# Patient Record
Sex: Female | Born: 1950
Health system: Southern US, Community
[De-identification: ages and names within clinical notes are randomized; demographics above are authoritative.]

## PROBLEM LIST (undated history)

## (undated) DIAGNOSIS — M7072 Other bursitis of hip, left hip: Secondary | ICD-10-CM

## (undated) DIAGNOSIS — R519 Headache, unspecified: Secondary | ICD-10-CM

## (undated) DIAGNOSIS — Z85828 Personal history of other malignant neoplasm of skin: Secondary | ICD-10-CM

## (undated) DIAGNOSIS — H269 Unspecified cataract: Secondary | ICD-10-CM

## (undated) DIAGNOSIS — C801 Malignant (primary) neoplasm, unspecified: Secondary | ICD-10-CM

## (undated) DIAGNOSIS — K219 Gastro-esophageal reflux disease without esophagitis: Secondary | ICD-10-CM

## (undated) DIAGNOSIS — F329 Major depressive disorder, single episode, unspecified: Secondary | ICD-10-CM

## (undated) DIAGNOSIS — K589 Irritable bowel syndrome without diarrhea: Secondary | ICD-10-CM

## (undated) DIAGNOSIS — R51 Headache: Secondary | ICD-10-CM

## (undated) DIAGNOSIS — G4700 Insomnia, unspecified: Secondary | ICD-10-CM

## (undated) DIAGNOSIS — F32A Depression, unspecified: Secondary | ICD-10-CM

## (undated) DIAGNOSIS — M1990 Unspecified osteoarthritis, unspecified site: Secondary | ICD-10-CM

## (undated) DIAGNOSIS — R011 Cardiac murmur, unspecified: Secondary | ICD-10-CM

## (undated) DIAGNOSIS — J302 Other seasonal allergic rhinitis: Secondary | ICD-10-CM

## (undated) DIAGNOSIS — E785 Hyperlipidemia, unspecified: Secondary | ICD-10-CM

## (undated) DIAGNOSIS — I10 Essential (primary) hypertension: Secondary | ICD-10-CM

## (undated) HISTORY — PX: OTHER SURGICAL HISTORY: SHX169

## (undated) HISTORY — PX: CHOLECYSTECTOMY: SHX55

## (undated) HISTORY — PX: KNEE RECONSTRUCTION, MEDIAL PATELLAR FEMORAL LIGAMENT: SHX1898

## (undated) HISTORY — PX: KNEE ARTHROSCOPY: SHX127

## (undated) HISTORY — PX: FRACTURE SURGERY: SHX138

## (undated) HISTORY — PX: COSMETIC SURGERY: SHX468

## (undated) HISTORY — PX: REDUCTION MAMMAPLASTY: SUR839

## (undated) HISTORY — PX: TUBAL LIGATION: SHX77

## (undated) HISTORY — DX: Essential (primary) hypertension: I10

## (undated) HISTORY — PX: COLON SURGERY: SHX602

## (undated) HISTORY — PX: BUNIONECTOMY: SHX129

## (undated) HISTORY — PX: ABDOMINOPLASTY: SUR9

## (undated) HISTORY — PX: EYE SURGERY: SHX253

## (undated) HISTORY — PX: JOINT REPLACEMENT: SHX530

## (undated) HISTORY — PX: BREAST SURGERY: SHX581

## (undated) HISTORY — DX: Unspecified osteoarthritis, unspecified site: M19.90

## (undated) HISTORY — PX: SPINE SURGERY: SHX786

## (undated) HISTORY — DX: Personal history of other malignant neoplasm of skin: Z85.828

## (undated) HISTORY — DX: Unspecified cataract: H26.9

## (undated) HISTORY — DX: Irritable bowel syndrome, unspecified: K58.9

## (undated) HISTORY — DX: Gastro-esophageal reflux disease without esophagitis: K21.9

---

## 1981-09-10 HISTORY — PX: BREAST ENHANCEMENT SURGERY: SHX7

## 1984-09-10 HISTORY — PX: COLECTOMY: SHX59

## 1997-09-10 HISTORY — PX: BACK SURGERY: SHX140

## 1998-08-12 ENCOUNTER — Encounter: Payer: Self-pay | Admitting: Orthopedic Surgery

## 1998-08-12 ENCOUNTER — Observation Stay (HOSPITAL_COMMUNITY): Admission: RE | Admit: 1998-08-12 | Discharge: 1998-08-14 | Payer: Self-pay | Admitting: Orthopedic Surgery

## 1998-08-18 ENCOUNTER — Ambulatory Visit (HOSPITAL_COMMUNITY): Admission: RE | Admit: 1998-08-18 | Discharge: 1998-08-18 | Payer: Self-pay | Admitting: Orthopedic Surgery

## 1998-12-14 ENCOUNTER — Other Ambulatory Visit: Admission: RE | Admit: 1998-12-14 | Discharge: 1998-12-14 | Payer: Self-pay | Admitting: Family Medicine

## 1999-12-18 ENCOUNTER — Ambulatory Visit (HOSPITAL_COMMUNITY): Admission: RE | Admit: 1999-12-18 | Discharge: 1999-12-19 | Payer: Self-pay | Admitting: Neurosurgery

## 1999-12-18 ENCOUNTER — Encounter: Payer: Self-pay | Admitting: Neurosurgery

## 1999-12-18 HISTORY — PX: CERVICAL SPINE SURGERY: SHX589

## 2000-01-17 ENCOUNTER — Other Ambulatory Visit: Admission: RE | Admit: 2000-01-17 | Discharge: 2000-01-17 | Payer: Self-pay | Admitting: Family Medicine

## 2000-02-12 ENCOUNTER — Ambulatory Visit (HOSPITAL_COMMUNITY): Admission: RE | Admit: 2000-02-12 | Discharge: 2000-02-12 | Payer: Self-pay | Admitting: Neurosurgery

## 2000-02-12 ENCOUNTER — Encounter: Payer: Self-pay | Admitting: Neurosurgery

## 2001-02-05 ENCOUNTER — Other Ambulatory Visit: Admission: RE | Admit: 2001-02-05 | Discharge: 2001-02-05 | Payer: Self-pay | Admitting: Family Medicine

## 2001-04-08 ENCOUNTER — Emergency Department (HOSPITAL_COMMUNITY): Admission: EM | Admit: 2001-04-08 | Discharge: 2001-04-08 | Payer: Self-pay | Admitting: Emergency Medicine

## 2001-04-08 ENCOUNTER — Encounter: Payer: Self-pay | Admitting: Emergency Medicine

## 2001-05-29 ENCOUNTER — Ambulatory Visit (HOSPITAL_COMMUNITY): Admission: RE | Admit: 2001-05-29 | Discharge: 2001-05-29 | Payer: Self-pay | Admitting: Neurosurgery

## 2001-05-29 ENCOUNTER — Encounter: Payer: Self-pay | Admitting: Neurosurgery

## 2001-11-20 ENCOUNTER — Ambulatory Visit (HOSPITAL_COMMUNITY): Admission: RE | Admit: 2001-11-20 | Discharge: 2001-11-20 | Payer: Self-pay | Admitting: Plastic Surgery

## 2003-05-20 ENCOUNTER — Emergency Department (HOSPITAL_COMMUNITY): Admission: EM | Admit: 2003-05-20 | Discharge: 2003-05-20 | Payer: Self-pay | Admitting: Emergency Medicine

## 2003-05-21 ENCOUNTER — Other Ambulatory Visit: Admission: RE | Admit: 2003-05-21 | Discharge: 2003-05-21 | Payer: Self-pay | Admitting: Obstetrics and Gynecology

## 2003-09-11 ENCOUNTER — Emergency Department (HOSPITAL_COMMUNITY): Admission: EM | Admit: 2003-09-11 | Discharge: 2003-09-11 | Payer: Self-pay | Admitting: Emergency Medicine

## 2004-03-24 ENCOUNTER — Emergency Department (HOSPITAL_COMMUNITY): Admission: EM | Admit: 2004-03-24 | Discharge: 2004-03-24 | Payer: Self-pay | Admitting: Emergency Medicine

## 2004-06-20 ENCOUNTER — Other Ambulatory Visit: Admission: RE | Admit: 2004-06-20 | Discharge: 2004-06-20 | Payer: Self-pay | Admitting: Obstetrics and Gynecology

## 2004-10-13 ENCOUNTER — Emergency Department (HOSPITAL_COMMUNITY): Admission: EM | Admit: 2004-10-13 | Discharge: 2004-10-13 | Payer: Self-pay | Admitting: Emergency Medicine

## 2005-08-15 ENCOUNTER — Other Ambulatory Visit: Admission: RE | Admit: 2005-08-15 | Discharge: 2005-08-15 | Payer: Self-pay | Admitting: Obstetrics and Gynecology

## 2006-01-18 ENCOUNTER — Ambulatory Visit: Payer: Self-pay | Admitting: Family Medicine

## 2006-01-24 ENCOUNTER — Ambulatory Visit: Payer: Self-pay | Admitting: Family Medicine

## 2006-04-24 ENCOUNTER — Ambulatory Visit: Payer: Self-pay | Admitting: Family Medicine

## 2007-02-04 ENCOUNTER — Ambulatory Visit: Payer: Self-pay | Admitting: Family Medicine

## 2007-02-04 LAB — CONVERTED CEMR LAB
ALT: 23 units/L (ref 0–40)
AST: 30 units/L (ref 0–37)
Albumin: 4.3 g/dL (ref 3.5–5.2)
Alkaline Phosphatase: 65 units/L (ref 39–117)
BUN: 9 mg/dL (ref 6–23)
Basophils Absolute: 0.1 10*3/uL (ref 0.0–0.1)
Basophils Relative: 1 % (ref 0.0–1.0)
Bilirubin, Direct: 0.3 mg/dL (ref 0.0–0.3)
CO2: 31 meq/L (ref 19–32)
Calcium: 9.9 mg/dL (ref 8.4–10.5)
Chloride: 110 meq/L (ref 96–112)
Cholesterol: 158 mg/dL (ref 0–200)
Creatinine, Ser: 1 mg/dL (ref 0.4–1.2)
Eosinophils Absolute: 0.2 10*3/uL (ref 0.0–0.6)
Eosinophils Relative: 2.9 % (ref 0.0–5.0)
GFR calc Af Amer: 74 mL/min
GFR calc non Af Amer: 61 mL/min
Glucose, Bld: 106 mg/dL — ABNORMAL HIGH (ref 70–99)
HCT: 42.4 % (ref 36.0–46.0)
HDL: 43.5 mg/dL (ref >39.0)
Hemoglobin: 14.3 g/dL (ref 12.0–15.0)
LDL Cholesterol: 88 mg/dL (ref 0–99)
Lymphocytes Relative: 25.1 % (ref 12.0–46.0)
MCHC: 33.6 g/dL (ref 30.0–36.0)
MCV: 91 fL (ref 78.0–100.0)
Monocytes Absolute: 0.6 10*3/uL (ref 0.2–0.7)
Monocytes Relative: 8.1 % (ref 3.0–11.0)
Neutro Abs: 5.1 10*3/uL (ref 1.4–7.7)
Neutrophils Relative %: 62.9 % (ref 43.0–77.0)
Platelets: 217 10*3/uL (ref 150–400)
Potassium: 5.1 meq/L (ref 3.5–5.1)
RBC: 4.66 M/uL (ref 3.87–5.11)
RDW: 11.9 % (ref 11.5–14.6)
Sodium: 149 meq/L — ABNORMAL HIGH (ref 135–145)
TSH: 0.95 microintl units/mL (ref 0.35–5.50)
Total Bilirubin: 1.2 mg/dL (ref 0.3–1.2)
Total CHOL/HDL Ratio: 3.6
Total Protein: 6.4 g/dL (ref 6.0–8.3)
Triglycerides: 134 mg/dL (ref 0–149)
VLDL: 27 mg/dL (ref 0–40)
WBC: 8 10*3/uL (ref 4.5–10.5)

## 2007-02-05 ENCOUNTER — Encounter: Payer: Self-pay | Admitting: Family Medicine

## 2007-02-05 DIAGNOSIS — G43909 Migraine, unspecified, not intractable, without status migrainosus: Secondary | ICD-10-CM | POA: Insufficient documentation

## 2007-02-05 DIAGNOSIS — I10 Essential (primary) hypertension: Secondary | ICD-10-CM | POA: Insufficient documentation

## 2007-02-05 DIAGNOSIS — K219 Gastro-esophageal reflux disease without esophagitis: Secondary | ICD-10-CM | POA: Insufficient documentation

## 2007-02-05 DIAGNOSIS — J309 Allergic rhinitis, unspecified: Secondary | ICD-10-CM | POA: Insufficient documentation

## 2007-02-05 DIAGNOSIS — S0990XA Unspecified injury of head, initial encounter: Secondary | ICD-10-CM | POA: Insufficient documentation

## 2007-02-05 DIAGNOSIS — R569 Unspecified convulsions: Secondary | ICD-10-CM | POA: Insufficient documentation

## 2007-02-11 ENCOUNTER — Ambulatory Visit: Payer: Self-pay | Admitting: Family Medicine

## 2007-09-11 HISTORY — PX: LASIK: SHX215

## 2008-02-10 ENCOUNTER — Ambulatory Visit: Payer: Self-pay | Admitting: Family Medicine

## 2008-02-17 ENCOUNTER — Ambulatory Visit: Payer: Self-pay | Admitting: Family Medicine

## 2008-02-17 DIAGNOSIS — K589 Irritable bowel syndrome without diarrhea: Secondary | ICD-10-CM | POA: Insufficient documentation

## 2008-02-17 DIAGNOSIS — M549 Dorsalgia, unspecified: Secondary | ICD-10-CM | POA: Insufficient documentation

## 2008-03-08 ENCOUNTER — Encounter: Payer: Self-pay | Admitting: Family Medicine

## 2008-04-05 ENCOUNTER — Telehealth: Payer: Self-pay | Admitting: Family Medicine

## 2008-04-20 ENCOUNTER — Encounter: Payer: Self-pay | Admitting: Family Medicine

## 2009-05-23 ENCOUNTER — Encounter: Payer: Self-pay | Admitting: Family Medicine

## 2009-05-30 ENCOUNTER — Telehealth: Payer: Self-pay | Admitting: Family Medicine

## 2009-05-31 ENCOUNTER — Ambulatory Visit: Payer: Self-pay | Admitting: Family Medicine

## 2009-06-08 ENCOUNTER — Ambulatory Visit: Payer: Self-pay | Admitting: Family Medicine

## 2009-06-08 DIAGNOSIS — R197 Diarrhea, unspecified: Secondary | ICD-10-CM | POA: Insufficient documentation

## 2009-06-08 DIAGNOSIS — F329 Major depressive disorder, single episode, unspecified: Secondary | ICD-10-CM

## 2009-06-08 DIAGNOSIS — E785 Hyperlipidemia, unspecified: Secondary | ICD-10-CM | POA: Insufficient documentation

## 2009-06-08 DIAGNOSIS — M722 Plantar fascial fibromatosis: Secondary | ICD-10-CM | POA: Insufficient documentation

## 2009-06-08 DIAGNOSIS — N951 Menopausal and female climacteric states: Secondary | ICD-10-CM | POA: Insufficient documentation

## 2009-06-08 DIAGNOSIS — M19049 Primary osteoarthritis, unspecified hand: Secondary | ICD-10-CM | POA: Insufficient documentation

## 2009-06-08 DIAGNOSIS — M7710 Lateral epicondylitis, unspecified elbow: Secondary | ICD-10-CM | POA: Insufficient documentation

## 2009-06-08 DIAGNOSIS — F3289 Other specified depressive episodes: Secondary | ICD-10-CM | POA: Insufficient documentation

## 2009-06-08 LAB — CONVERTED CEMR LAB
ALT: 25 units/L (ref 0–35)
AST: 22 units/L (ref 0–37)
Albumin: 4.2 g/dL (ref 3.5–5.2)
Alkaline Phosphatase: 74 units/L (ref 39–117)
BUN: 15 mg/dL (ref 6–23)
Basophils Absolute: 0.1 10*3/uL (ref 0.0–0.1)
Basophils Relative: 0.6 % (ref 0.0–3.0)
Bilirubin Urine: NEGATIVE
Bilirubin, Direct: 0.1 mg/dL (ref 0.0–0.3)
CO2: 29 meq/L (ref 19–32)
Calcium: 9.3 mg/dL (ref 8.4–10.5)
Chloride: 105 meq/L (ref 96–112)
Cholesterol: 159 mg/dL (ref 0–200)
Creatinine, Ser: 1 mg/dL (ref 0.4–1.2)
Eosinophils Absolute: 0.3 10*3/uL (ref 0.0–0.7)
Eosinophils Relative: 3.1 % (ref 0.0–5.0)
GFR calc non Af Amer: 60.32 mL/min (ref >60)
Glucose, Bld: 93 mg/dL (ref 70–99)
HCT: 35.3 % — ABNORMAL LOW (ref 36.0–46.0)
HDL: 50.7 mg/dL (ref >39.00)
Hemoglobin: 12 g/dL (ref 12.0–15.0)
Hgb A1c MFr Bld: 5.7 % (ref 4.6–6.5)
Ketones, ur: NEGATIVE mg/dL
LDL Cholesterol: 82 mg/dL (ref 0–99)
Lymphocytes Relative: 26.2 % (ref 12.0–46.0)
Lymphs Abs: 2.2 10*3/uL (ref 0.7–4.0)
MCHC: 34 g/dL (ref 30.0–36.0)
MCV: 89.4 fL (ref 78.0–100.0)
Monocytes Absolute: 0.8 10*3/uL (ref 0.1–1.0)
Monocytes Relative: 9.5 % (ref 3.0–12.0)
Neutro Abs: 5.1 10*3/uL (ref 1.4–7.7)
Neutrophils Relative %: 60.6 % (ref 43.0–77.0)
Nitrite: NEGATIVE
Platelets: 226 10*3/uL (ref 150.0–400.0)
Potassium: 3.8 meq/L (ref 3.5–5.1)
RBC: 3.95 M/uL (ref 3.87–5.11)
RDW: 12.3 % (ref 11.5–14.6)
Sodium: 143 meq/L (ref 135–145)
Specific Gravity, Urine: 1.015 (ref 1.000–1.030)
TSH: 2.25 microintl units/mL (ref 0.35–5.50)
Total Bilirubin: 0.8 mg/dL (ref 0.3–1.2)
Total CHOL/HDL Ratio: 3
Total Protein, Urine: NEGATIVE mg/dL
Total Protein: 7 g/dL (ref 6.0–8.3)
Triglycerides: 132 mg/dL (ref 0.0–149.0)
Urine Glucose: NEGATIVE mg/dL
Urobilinogen, UA: 0.2 (ref 0.0–1.0)
VLDL: 26.4 mg/dL (ref 0.0–40.0)
WBC: 8.5 10*3/uL (ref 4.5–10.5)
pH: 6.5 (ref 5.0–8.0)

## 2009-06-10 ENCOUNTER — Encounter (INDEPENDENT_AMBULATORY_CARE_PROVIDER_SITE_OTHER): Payer: Self-pay | Admitting: *Deleted

## 2009-06-12 LAB — CONVERTED CEMR LAB: Rhuematoid fact SerPl-aCnc: 0 intl units/mL (ref 0.0–20.0)

## 2009-12-12 ENCOUNTER — Telehealth: Payer: Self-pay | Admitting: Family Medicine

## 2010-06-06 ENCOUNTER — Encounter: Payer: Self-pay | Admitting: Family Medicine

## 2010-07-05 ENCOUNTER — Encounter: Payer: Self-pay | Admitting: Family Medicine

## 2010-07-06 ENCOUNTER — Ambulatory Visit: Payer: Self-pay | Admitting: Family Medicine

## 2010-07-06 DIAGNOSIS — Z85828 Personal history of other malignant neoplasm of skin: Secondary | ICD-10-CM | POA: Insufficient documentation

## 2010-07-06 DIAGNOSIS — C4490 Unspecified malignant neoplasm of skin, unspecified: Secondary | ICD-10-CM | POA: Insufficient documentation

## 2010-07-06 DIAGNOSIS — L570 Actinic keratosis: Secondary | ICD-10-CM | POA: Insufficient documentation

## 2010-07-06 LAB — CONVERTED CEMR LAB
ALT: 23 units/L (ref 0–35)
AST: 27 units/L (ref 0–37)
Bilirubin, Direct: 0.1 mg/dL (ref 0.0–0.3)
Calcium: 9.4 mg/dL (ref 8.4–10.5)
Creatinine, Ser: 0.9 mg/dL (ref 0.4–1.2)
Eosinophils Relative: 3.8 % (ref 0.0–5.0)
HDL: 53.6 mg/dL (ref >39.00)
Lymphocytes Relative: 26.3 % (ref 12.0–46.0)
Monocytes Relative: 8.4 % (ref 3.0–12.0)
Neutrophils Relative %: 60.7 % (ref 43.0–77.0)
Platelets: 237 10*3/uL (ref 150.0–400.0)
Potassium: 4.3 meq/L (ref 3.5–5.1)
Sodium: 135 meq/L (ref 135–145)
Total Bilirubin: 0.8 mg/dL (ref 0.3–1.2)
Total CHOL/HDL Ratio: 3
VLDL: 24.4 mg/dL (ref 0.0–40.0)
WBC: 6.8 10*3/uL (ref 4.5–10.5)

## 2010-07-07 ENCOUNTER — Encounter: Payer: Self-pay | Admitting: Family Medicine

## 2010-07-11 ENCOUNTER — Telehealth (INDEPENDENT_AMBULATORY_CARE_PROVIDER_SITE_OTHER): Payer: Self-pay | Admitting: *Deleted

## 2010-08-10 ENCOUNTER — Ambulatory Visit: Payer: Self-pay | Admitting: Internal Medicine

## 2010-08-10 ENCOUNTER — Telehealth: Payer: Self-pay | Admitting: Family Medicine

## 2010-08-10 DIAGNOSIS — R030 Elevated blood-pressure reading, without diagnosis of hypertension: Secondary | ICD-10-CM | POA: Insufficient documentation

## 2010-08-10 HISTORY — PX: BREAST IMPLANT REMOVAL: SUR1101

## 2010-08-31 ENCOUNTER — Encounter: Payer: Self-pay | Admitting: Family Medicine

## 2010-10-06 ENCOUNTER — Encounter: Payer: Self-pay | Admitting: Family Medicine

## 2010-10-08 LAB — CONVERTED CEMR LAB
ALT: 23 units/L (ref 0–35)
AST: 22 units/L (ref 0–37)
Alkaline Phosphatase: 65 units/L (ref 39–117)
Basophils Absolute: 0 10*3/uL (ref 0.0–0.1)
Basophils Relative: 0 % (ref 0.0–1.0)
CO2: 27 meq/L (ref 19–32)
Chloride: 102 meq/L (ref 96–112)
Creatinine, Ser: 0.9 mg/dL (ref 0.4–1.2)
Eosinophils Relative: 3.5 % (ref 0.0–5.0)
LDL Cholesterol: 56 mg/dL (ref 0–99)
Lymphocytes Relative: 28.9 % (ref 12.0–46.0)
MCHC: 34.5 g/dL (ref 30.0–36.0)
Neutrophils Relative %: 58.2 % (ref 43.0–77.0)
Nitrite: NEGATIVE
Potassium: 3.7 meq/L (ref 3.5–5.1)
RBC: 4.39 M/uL (ref 3.87–5.11)
TSH: 1.8 microintl units/mL (ref 0.35–5.50)
Total Bilirubin: 0.9 mg/dL (ref 0.3–1.2)
Urobilinogen, UA: 0.2
VLDL: 15 mg/dL (ref 0–40)
WBC: 5.3 10*3/uL (ref 4.5–10.5)

## 2010-10-10 NOTE — Consult Note (Signed)
Summary: [facility]  [facility]   Imported By: Maryln Gottron 07/14/2010 15:09:52  _____________________________________________________________________  External Attachment:    Type:   Image     Comment:   External Document

## 2010-10-10 NOTE — Progress Notes (Signed)
  Phone Note Outgoing Call   Summary of Call: Faxed to [facility] at [phone] per pts request. Initial call taken by: Josph Macho RMA,  July 11, 2010 8:57 AM     Appended Document:  pt called stateing that Angie never got this fax? I double checked fax number and the above listed is correct? Will discuss with [place]. Please call pt when this is refaxed.  Appended Document:  Pt informed that [employer] was refaxing letter on company letterhead and that [place] had mailed a copy to [employer] also.

## 2010-10-10 NOTE — Progress Notes (Signed)
Summary: REFILL REQUEST (please see notation)  Phone Note Refill Request Message from:  Patient on August 10, 2010 11:54 AM  Refills Requested: Medication #1:  WELLBUTRIN XL 300 MG  TB24 1 by mouth once daily   Notes: [facility].  Medication #2:  FUROSEMIDE 40 MG  TABS 1 by mouth once daily   Notes: [facility].  Medication #3:  PHENTERAMINE 37.5 1 qam to decrease appetite   Notes: [facility].  Medication #4:  LOMOTIL 2.5-0.025 MG TABS 1-2 qid as needed diarrhea   Notes: [facility].  Pt will need Rx for med(s) prepared for her to take to local pharmacy so she will have enough of med to do her till her meds from [facility] arrive.   Initial call taken by: Debbra Riding,  August 10, 2010 11:56 AM Caller: Patient (came by office)  Follow-up for Phone Call        pt will need an appt for the other meds including Lunesta Follow-up by: Alfred Levins, CMA,  August 14, 2010 2:57 PM  Additional Follow-up for Phone Call Additional follow up Details #1::        Pt advised that she had her cpx with Dr Abner Greenspan on 07/06/10 because Dr Scotty Court was out.... Pt states also that she just saw Dr Maggie Schwalbe last week because Dr Scotty Court was not available. Pt was advised about making appt with Dr Scotty Court but states she has already had cpx along with an additional appt with Dr Kirtland Bouchard, doesn't understand why she needs to come back in?  Pt adv that she would also like to have a return call at [phone] to discuss her labwork from October because no one ever called her about the results??????  INFO ONLY---- Pt adv that last year when she saw Dr Scotty Court, they discussed her having a breast reduction -  she is currently working Dr Odis Luster about same - [employer] won't pay for it but she is going to go ahead with the surgery anyway.  Additional Follow-up by: Debbra Riding,  August 14, 2010 3:14 PM    Additional Follow-up for Phone Call Additional follow up Details #2::    Because she hasn't seen Dr Scotty Court since 2010 and  he can't refill her meds until HE sees her again Follow-up by: Alfred Levins, CMA,  August 14, 2010 3:18 PM  Additional Follow-up for Phone Call Additional follow up Details #3:: Details for Additional Follow-up Action Taken: Pt was advised of this in our conversation earlier..... LMTCB so she can be advised once again...Marland KitchenMarland KitchenMarland Kitchen Debbra Riding  August 14, 2010 4:38 PM   Pt called back and was advised of information..... Pt made appt for next available which was appt on 10/19/10  at  11am.... would like enough of meds to do her till her appt date / time sent to [facility]..... Pt also adv that she has not heard anything in reference to labwork, would like a return call to discuss same.... Pt advised that msg would be passed along...Marland KitchenMarland KitchenMarland Kitchen Debbra Riding  August 14, 2010 4:44 PM     Additional Follow-up by: Debbra Riding,  August 14, 2010 4:38 PM  Prescriptions: WELLBUTRIN XL 300 MG  TB24 (BUPROPION HCL) 1 by mouth once daily  #90 x 3   Entered by:   Alfred Levins, CMA   Authorized by:   Judithann Sheen MD   Signed by:   Alfred Levins, CMA on 08/14/2010   Method used:   Electronically to  [facility]* (retail)             ,          Ph: [phone]       Fax: [phone]   RxID:   [id number] FUROSEMIDE 40 MG  TABS (FUROSEMIDE) 1 by mouth once daily  #90 x 3   Entered by:   Alfred Levins, CMA   Authorized by:   Judithann Sheen MD   Signed by:   Alfred Levins, CMA on 08/14/2010   Method used:   Electronically to        [facility]* (retail)             ,          Ph: [phone]       Fax: [phone]   RxID:   [id number]

## 2010-10-10 NOTE — Assessment & Plan Note (Signed)
Summary: CPX // RS---PT RSC (PT DECIDED TO COME IN FASTING FOR LABWORK...   Vital Signs:  Patient profile:   60 year old female Height:      67 inches (170.18 cm) Weight:      172 pounds (78.18 kg) BMI:     27.04 O2 Sat:      98 % on Room air Temp:     97.8 degrees F (36.56 degrees C) oral Pulse rate:   96 / minute BP sitting:   132 / 92  (left arm) Cuff size:   regular  Vitals Entered By: Josph Macho RMA (July 06, 2010 9:12 AM)  O2 Flow:  Room air CC: Physical- no pap/ CF Is Patient Diabetic? No   CC:  Physical- no pap/ CF.  History of Present Illness: this 60 year old Caucasian female in today for a annual exam from the practice of Dr. Scotty Court. She has multiple concerns one is ongoing and chronic migraine headaches one to 2 a week, bilateral shoulder pain, neck pain, upper back pain. She does also note some mild low back pain this is with massage therapy and weight loss anorexia and 55. The mouth is clear but unfortunately her back pain persists and she is ready to proceed with a breast reduction hoping that will help her symptoms. She follows with dermatology he has had multiple back basal cell carcinomas one on her right upper extremity one on her right lower shin he removed some squamous cells removed and recently had an actinic keratosis frozen as well. For her chronic back pain she did acupuncture for 3 months in 2009 now does massage therapy one every week to 2 and finds that helpful. Trouble sleeping due to the pain and inability to relax can't lie on her left side. She has trouble falling asleep and staying asleep. She had Lasik eye surgery last year and was told to stop her Relpax since doing that her migraine frequency has decreased. she has one to two-week response to hydrocodone. Tegretol caused nausea. She does her blood pressures been up and she's been asked plastic surgeons at dermatologist in for her eye surgery recently. She also had recent right foot surgery for  bunion removed and her blood pressure was in the 130s over 90s each time. She denies chest pain, palpitations, shortness of breath, GI or GU concerns at this time. Other than some persistent dyspepsia, belching and intermittent mild heartburn.  Preventive Screening-Counseling & Management  Alcohol-Tobacco     Smoking Status: never      Drug Use:  no.    Problems Prior to Update: 1)  Plantar Fasciitis, Right  (ICD-728.71) 2)  Depression  (ICD-311) 3)  Lateral Epicondylitis, Right  (ICD-726.32) 4)  Hyperlipidemia, Controlled  (ICD-272.4) 5)  Diarrhea  (ICD-787.91) 6)  Arthritis, Hands, Bilateral  (ICD-716.94) 7)  Postmenopausal Status  (ICD-627.2) 8)  Physical Examination  (ICD-V70.0) 9)  Conjunctivitis, Acute  (ICD-372.00) 10)  Irritable Bowel Syndrome  (ICD-564.1) 11)  Back Pain, Chronic  (ICD-724.5) 12)  Migraine Headache  (ICD-346.90) 13)  Head Trauma, Closed  (ICD-959.01) 14)  Allergic Rhinitis  (ICD-477.9) 15)  Seizure Disorder  (ICD-780.39) 16)  Hypertension  (ICD-401.9) 17)  Gerd  (ICD-530.81)  Current Problems (verified): 1)  Plantar Fasciitis, Right  (ICD-728.71) 2)  Depression  (ICD-311) 3)  Lateral Epicondylitis, Right  (ICD-726.32) 4)  Hyperlipidemia, Controlled  (ICD-272.4) 5)  Diarrhea  (ICD-787.91) 6)  Arthritis, Hands, Bilateral  (ICD-716.94) 7)  Postmenopausal Status  (ICD-627.2) 8)  Physical Examination  (  ICD-V70.0) 9)  Conjunctivitis, Acute  (ICD-372.00) 10)  Irritable Bowel Syndrome  (ICD-564.1) 11)  Back Pain, Chronic  (ICD-724.5) 12)  Migraine Headache  (ICD-346.90) 13)  Head Trauma, Closed  (ICD-959.01) 14)  Allergic Rhinitis  (ICD-477.9) 15)  Seizure Disorder  (ICD-780.39) 16)  Hypertension  (ICD-401.9) 17)  Gerd  (ICD-530.81)  Medications Prior to Update: 1)  Furosemide 40 Mg  Tabs (Furosemide) .Marland Kitchen.. 1 By Mouth Once Daily 2)  Wellbutrin Xl 300 Mg  Tb24 (Bupropion Hcl) .Marland Kitchen.. 1 By Mouth Once Daily 3)  Lipitor 40 Mg  Tabs (Atorvastatin Calcium)  .Marland Kitchen.. 1 By Mouth Once Daily 4)  Librax 2.5-5 Mg  Caps (Clidinium-Chlordiazepoxide) .Marland Kitchen.. 1 Qid For Irritable Bowel 5)  Tobramycin-Dexamethasone 0.3-0.1 %  Susp (Tobramycin-Dexamethasone) .Marland Kitchen.. 1 Drop in Affected Eye Three Times A Day 6)  Claritin 10 Mg Tabs (Loratadine) 7)  Butalbital-Apap-Caff-Cod 50-325-40-30 Mg Caps (Butalbital-Apap-Caff-Cod) .Marland Kitchen.. 1-2 Q4h As Needed Headache 8)  Phenteramine 37.5 .Marland Kitchen.. 1 Qam To Decrease Appetite 9)  Lomotil 2.5-0.025 Mg Tabs (Diphenoxylate-Atropine) .Marland Kitchen.. 1-2 Qid As Needed Diarrhea 10)  Aciphex 20 Mg Tbec (Rabeprazole Sodium) .Marland Kitchen.. 1 Once Daily For Gerd 11)  Lunesta 3 Mg Tabs (Eszopiclone) .Marland Kitchen.. 1 By Mouth Nightly As Needed Sleep 12)  Nasonex 50 Mcg/act Susp (Mometasone Furoate) .... Use 2 Sprays Each Nostril Daily  Current Medications (verified): 1)  Furosemide 40 Mg  Tabs (Furosemide) .Marland Kitchen.. 1 By Mouth Once Daily 2)  Wellbutrin Xl 300 Mg  Tb24 (Bupropion Hcl) .Marland Kitchen.. 1 By Mouth Once Daily 3)  Lipitor 40 Mg  Tabs (Atorvastatin Calcium) .Marland Kitchen.. 1 By Mouth Once Daily 4)  Librax 2.5-5 Mg  Caps (Clidinium-Chlordiazepoxide) .Marland Kitchen.. 1 Qid For Irritable Bowel 5)  Tobramycin-Dexamethasone 0.3-0.1 %  Susp (Tobramycin-Dexamethasone) .Marland Kitchen.. 1 Drop in Affected Eye Three Times A Day 6)  Claritin 10 Mg Tabs (Loratadine) 7)  Butalbital-Apap-Caff-Cod 50-325-40-30 Mg Caps (Butalbital-Apap-Caff-Cod) .Marland Kitchen.. 1-2 Q4h As Needed Headache 8)  Phenteramine 37.5 .Marland Kitchen.. 1 Qam To Decrease Appetite 9)  Lomotil 2.5-0.025 Mg Tabs (Diphenoxylate-Atropine) .Marland Kitchen.. 1-2 Qid As Needed Diarrhea 10)  Aciphex 20 Mg Tbec (Rabeprazole Sodium) .Marland Kitchen.. 1 Once Daily For Gerd 11)  Lunesta 3 Mg Tabs (Eszopiclone) .Marland Kitchen.. 1 By Mouth Nightly As Needed Sleep 12)  Nasonex 50 Mcg/act Susp (Mometasone Furoate) .... Use 2 Sprays Each Nostril Daily 13)  Vivelle-Dot 0.05 Mg/24hr Pttw (Estradiol) .... Change Patch Wed and Sat  Allergies (verified): 1)  ! * Anti Inflammatory Drugs  Past History:  Past Medical History: Last updated:  02/05/2007 GERD Hypertension Seizure  Degenerative Arthritis Allergic rhinitis Obesity  Social History: Last updated: 07/06/2010 Retired in June of 2011 Never Smoked Alcohol use-yes, infrequent and light Drug use-no  Risk Factors: Smoking Status: never (07/06/2010)  Past Surgical History: Appendectomy Cholecystectomy Neck Fusion right foot bunionectomy Lasik eye surgery  Family History: Mother deceased at 34, HTN diagnosed in 5s  Social History: Retired in June of 2011 Never Smoked Alcohol use-yes, infrequent and light Drug use-no Smoking Status:  never Drug Use:  no  Review of Systems       The patient complains of weight loss.  The patient denies anorexia, fever, weight gain, vision loss, decreased hearing, hoarseness, chest pain, syncope, dyspnea on exertion, peripheral edema, prolonged cough, headaches, hemoptysis, abdominal pain, melena, hematochezia, severe indigestion/heartburn, hematuria, incontinence, genital sores, muscle weakness, suspicious skin lesions, transient blindness, difficulty walking, depression, unusual weight change, abnormal bleeding, enlarged lymph nodes, angioedema, breast masses, and testicular masses.         Flu Vaccine Consent  Questions     Do you have a history of severe allergic reactions to this vaccine? no    Any prior history of allergic reactions to egg and/or gelatin? no    Do you have a sensitivity to the preservative Thimersol? no    Do you have a past history of Guillan-Barre Syndrome? no    Do you currently have an acute febrile illness? no    Have you ever had a severe reaction to latex? no    Vaccine information given and explained to patient? yes    Are you currently pregnant? no    Lot Number:[id number]   Exp Date:03/10/2011   Site Given  Left Deltoid IM Josph Macho RMA  July 06, 2010 9:18 AM    Physical Exam  General:  Well-developed,well-nourished,in no acute distress; alert,appropriate and cooperative  throughout examination Head:  Normocephalic and atraumatic without obvious abnormalities. No apparent alopecia or balding. Mouth:  Oral mucosa and oropharynx without lesions or exudates.  Teeth in good repair. Neck:  No deformities, masses, or tenderness noted. Lungs:  Normal respiratory effort, chest expands symmetrically. Lungs are clear to auscultation, no crackles or wheezes. Heart:  Normal rate and regular rhythm. S1 and S2 normal without gallop, murmur, click, rub or other extra sounds. Abdomen:  Bowel sounds positive,abdomen soft and non-tender without masses, organomegaly or hernias noted. Msk:  No deformity or scoliosis noted of thoracic or lumbar spine.   Pulses:  R and L carotid dorsalis pedis and posterior tibial pulses are full and equal bilaterally Extremities:  No clubbing, cyanosis, edema, or deformity noted  Neurologic:  No cranial nerve deficits noted. Station and gait are normal. Plantar reflexes are down-going bilaterally. DTRs are symmetrical throughout. Sensory, motor and coordinative functions appear intact. Cervical Nodes:  No lymphadenopathy noted Psych:  Cognition and judgment appear intact. Alert and cooperative with normal attention span and concentration. No apparent delusions, illusions, hallucinations   Impression & Recommendations:  Problem # 1:  BACK PAIN, CHRONIC (ICD-724.5)  The following medications were removed from the medication list:    Butalbital-apap-caff-cod 50-325-40-30 Mg Caps (Butalbital-apap-caff-cod) .Marland Kitchen... 1-2 q4h as needed headache Her updated medication list for this problem includes:    Hydrocodone-acetaminophen 10-650 Mg Tabs (Hydrocodone-acetaminophen) .Marland Kitchen... 1 tab by mouth two times a day as needed pain Recommend moist heat gentle stretch and proceed with breast reduction and continue with breast reduction process  Problem # 2:  HYPERLIPIDEMIA, CONTROLLED (ICD-272.4)  Her updated medication list for this problem includes:    Lipitor 40  Mg Tabs (Atorvastatin calcium) .Marland Kitchen... 1 by mouth once daily  Orders: TLB-Lipid Panel (80061-LIPID) Cont Lipitor for now, may want to switch to generic  Problem # 3:  IRRITABLE BOWEL SYNDROME (ICD-564.1) Add Align and Benefiber, consider Zantac or Prilosec as needed for dyspepsia  Problem # 4:  ALLERGIC RHINITIS (ICD-477.9)  Her updated medication list for this problem includes:    Claritin 10 Mg Tabs (Loratadine)    Nasonex 50 Mcg/act Susp (Mometasone furoate) ..... Use 2 sprays each nostril daily  Problem # 5:  MIGRAINE HEADACHE (ICD-346.90)  The following medications were removed from the medication list:    Butalbital-apap-caff-cod 50-325-40-30 Mg Caps (Butalbital-apap-caff-cod) .Marland Kitchen... 1-2 q4h as needed headache Her updated medication list for this problem includes:    Hydrocodone-acetaminophen 10-650 Mg Tabs (Hydrocodone-acetaminophen) .Marland Kitchen... 1 tab by mouth two times a day as needed pain Improved given Lunesta to use for better sleep, has worked in past. Increase exercise and fluids  Complete Medication  List: 1)  Furosemide 40 Mg Tabs (Furosemide) .Marland Kitchen.. 1 by mouth once daily 2)  Wellbutrin Xl 300 Mg Tb24 (Bupropion hcl) .Marland Kitchen.. 1 by mouth once daily 3)  Lipitor 40 Mg Tabs (Atorvastatin calcium) .Marland Kitchen.. 1 by mouth once daily 4)  Librax 2.5-5 Mg Caps (Clidinium-chlordiazepoxide) .Marland Kitchen.. 1 qid for irritable bowel 5)  Tobramycin-dexamethasone 0.3-0.1 % Susp (Tobramycin-dexamethasone) .Marland Kitchen.. 1 drop in affected eye three times a day 6)  Claritin 10 Mg Tabs (Loratadine) 7)  Phenteramine 37.5  .Marland Kitchen.. 1 qam to decrease appetite 8)  Lomotil 2.5-0.025 Mg Tabs (Diphenoxylate-atropine) .Marland Kitchen.. 1-2 qid as needed diarrhea 9)  Aciphex 20 Mg Tbec (Rabeprazole sodium) .Marland Kitchen.. 1 once daily for gerd 10)  Lunesta 3 Mg Tabs (Eszopiclone) .Marland Kitchen.. 1 by mouth nightly as needed sleep 11)  Nasonex 50 Mcg/act Susp (Mometasone furoate) .... Use 2 sprays each nostril daily 12)  Vivelle-dot 0.05 Mg/24hr Pttw (Estradiol) .... Change  patch wed and sat 13)  Hydrocodone-acetaminophen 10-650 Mg Tabs (Hydrocodone-acetaminophen) .Marland Kitchen.. 1 tab by mouth two times a day as needed pain  Other Orders: Admin 1st Vaccine (24401) Flu Vaccine 44yrs + (02725) TLB-Renal Function Panel (80069-RENAL) TLB-CBC Platelet - w/Differential (85025-CBCD) TLB-Hepatic/Liver Function Pnl (80076-HEPATIC) TLB-TSH (Thyroid Stimulating Hormone) (36644-IHK)  Patient Instructions: 1)  For the diarrhea add a yogurt and/or a probiotic such as Align cap daily  2)  Consider a fiber supplement such as Citracel or Benefiber 2 tsp daily in foods or fluids. 3)  Limit your Sodium(salt) .  4)  Consider daily brisk exercise for 20-30 minutes 5)  Need 7-8 hours sleep nightly 6)  Please schedule a follow-up appointment in 1 month.  Prescriptions: HYDROCODONE-ACETAMINOPHEN 10-650 MG TABS (HYDROCODONE-ACETAMINOPHEN) 1 tab by mouth two times a day as needed pain  #60 x 1   Entered and Authorized by:   Danise Edge MD   Signed by:   Danise Edge MD on 07/06/2010   Method used:   Print then Give to Patient   RxID:   [id number] LUNESTA 3 MG TABS (ESZOPICLONE) 1 by mouth nightly as needed sleep  #30 x 1   Entered and Authorized by:   Danise Edge MD   Signed by:   Danise Edge MD on 07/06/2010   Method used:   Print then Give to Patient   RxID:   [id number] PHENTERMINE HCL 37.5 MG TABS (PHENTERMINE HCL) 1 tab by mouth q am  #30 x 1   Entered and Authorized by:   Danise Edge MD   Signed by:   Danise Edge MD on 07/06/2010   Method used:   Print then Give to Patient   RxID:   [id number]    Orders Added: 1)  Admin 1st Vaccine [90471] 2)  Flu Vaccine 53yrs + [27062] 3)  TLB-Renal Function Panel [80069-RENAL] 4)  TLB-Lipid Panel [80061-LIPID] 5)  TLB-CBC Platelet - w/Differential [85025-CBCD] 6)  TLB-Hepatic/Liver Function Pnl [80076-HEPATIC] 7)  TLB-TSH (Thyroid Stimulating Hormone) [84443-TSH] 8)  Est. Patient 40-64 years [37628]

## 2010-10-10 NOTE — Letter (Signed)
Summary: Recommendation for Breast Reduction Surgery  Recommendation for Breast Reduction Surgery   Imported By: Maryln Gottron 07/13/2010 12:28:53  _____________________________________________________________________  External Attachment:    Type:   Image     Comment:   External Document

## 2010-10-10 NOTE — Letter (Signed)
Summary: Generic Letter  [facility]  [address] [place], [place] [address]   Phone: [phone]  Fax: [phone]    08/10/2010  [name] [address] [place], [place]  [address]  Dear [name]:  [name] is a patient followed in our internal medicine practice and was seen on August 10, 2010.  She has had some labile blood pressure readings and today her blood pressure was noted be normal at 130/80.  She has been medically stable and she denied any cardiopulmonary complaints.  She has been asked to track her blood pressure preoperatively and to notify the office if there are any elevated readings.  There are no contraindications to the anticipated surgery.           Sincerely,   Eleonore Chiquito  MD

## 2010-10-10 NOTE — Assessment & Plan Note (Signed)
Summary: BP CONCERNS - OK PER KIM, RN // RS   Vital Signs:  Patient profile:   60 year old female Weight:      174 pounds Temp:     98.1 degrees F oral BP sitting:   128 / 80  (right arm) Cuff size:   regular  Vitals Entered By: Duard Brady LPN (August 10, 2010 12:44 PM) CC: f/u BP - surgical clearance Is Patient Diabetic? No   CC:  f/u BP - surgical clearance.  History of Present Illness: 60 year old patient who is scheduled for elective breast reduction surgery on December 27.  She has had some labile blood pressure readings over the past several weeks and is seen today for presurgical clearance.  She has no prior history of treated hypertension, but her mother had hypertension and premature coronary artery disease.  She has a history of mild obesity and does take Wellbutrin, and until recently was taking phentermine for weight control.  She does take her furosemide p.r.n.Marland Kitchen  She denies any cardiopulmonary  complaints and otherwise doing quite well.  She is pleased with recent foot surgery. she has a history of dyslipidemia, and depression, and is requesting medication refill  Allergies: 1)  ! * Anti Inflammatory Drugs  Past History:  Past Medical History: GERD Hypertension Seizure  Degenerative Arthritis Allergic rhinitis Obesity labile hypertension  Review of Systems       The patient complains of difficulty walking.  The patient denies anorexia, fever, weight loss, weight gain, vision loss, decreased hearing, hoarseness, chest pain, syncope, dyspnea on exertion, peripheral edema, prolonged cough, headaches, hemoptysis, abdominal pain, melena, hematochezia, severe indigestion/heartburn, hematuria, incontinence, genital sores, muscle weakness, suspicious skin lesions, transient blindness, depression, unusual weight change, abnormal bleeding, enlarged lymph nodes, angioedema, and breast masses.    Physical Exam  General:  overweight-appearing. blood pressure  130/80overweight-appearing.   Mouth:  Oral mucosa and oropharynx without lesions or exudates.  Teeth in good repair. Neck:  No deformities, masses, or tenderness noted. Lungs:  Normal respiratory effort, chest expands symmetrically. Lungs are clear to auscultation, no crackles or wheezes. Heart:  Normal rate and regular rhythm. S1 and S2 normal without gallop, murmur, click, rub or other extra sounds. Abdomen:  Bowel sounds positive,abdomen soft and non-tender without masses, organomegaly or hernias noted. Msk:  No deformity or scoliosis noted of thoracic or lumbar spine.   Extremities:  No clubbing, cyanosis, edema, or deformity noted with normal full range of motion of all joints.     Impression & Recommendations:  Problem # 1:  ELEVATED BLOOD PRESSURE WITHOUT DIAGNOSIS OF HYPERTENSION (ICD-796.2)  Her updated medication list for this problem includes:    Furosemide 40 Mg Tabs (Furosemide) .Marland Kitchen... 1 by mouth once daily  Her updated medication list for this problem includes:    Furosemide 40 Mg Tabs (Furosemide) .Marland Kitchen... 1 by mouth once daily  Problem # 2:  DEPRESSION (ICD-311)  Her updated medication list for this problem includes:    Wellbutrin Xl 300 Mg Tb24 (Bupropion hcl) .Marland Kitchen... 1 by mouth once daily  Her updated medication list for this problem includes:    Wellbutrin Xl 300 Mg Tb24 (Bupropion hcl) .Marland Kitchen... 1 by mouth once daily  Complete Medication List: 1)  Furosemide 40 Mg Tabs (Furosemide) .Marland Kitchen.. 1 by mouth once daily 2)  Wellbutrin Xl 300 Mg Tb24 (Bupropion hcl) .Marland Kitchen.. 1 by mouth once daily 3)  Librax 2.5-5 Mg Caps (Clidinium-chlordiazepoxide) .Marland Kitchen.. 1 qid for irritable bowel 4)  Tobramycin-dexamethasone 0.3-0.1 %  Susp (Tobramycin-dexamethasone) .Marland Kitchen.. 1 drop in affected eye three times a day 5)  Claritin 10 Mg Tabs (Loratadine) 6)  Phenteramine 37.5  .Marland Kitchen.. 1 qam to decrease appetite 7)  Lomotil 2.5-0.025 Mg Tabs (Diphenoxylate-atropine) .Marland Kitchen.. 1-2 qid as needed diarrhea 8)  Aciphex 20 Mg  Tbec (Rabeprazole sodium) .Marland Kitchen.. 1 once daily for gerd 9)  Lunesta 3 Mg Tabs (Eszopiclone) .Marland Kitchen.. 1 by mouth nightly as needed sleep 10)  Nasonex 50 Mcg/act Susp (Mometasone furoate) .... Use 2 sprays each nostril daily 11)  Vivelle-dot 0.05 Mg/24hr Pttw (Estradiol) .... Change patch wed and sat 12)  Hydrocodone-acetaminophen 10-650 Mg Tabs (Hydrocodone-acetaminophen) .Marland Kitchen.. 1 tab by mouth two times a day as needed pain 13)  Lipitor 40 Mg Tabs (Atorvastatin calcium) .... One daily  Patient Instructions: 1)  Limit your Sodium (Salt) to less than 2 grams a day(slightly less than 1/2 a teaspoon) to prevent fluid retention, swelling, or worsening of symptoms. 2)  It is important that you exercise regularly at least 20 minutes 5 times a week. If you develop chest pain, have severe difficulty breathing, or feel very tired , stop exercising immediately and seek medical attention. 3)  You need to lose weight. Consider a lower calorie diet and regular exercise.  4)  Check your Blood Pressure regularly. If it is above:  150/90 you should make an appointment. Prescriptions: WELLBUTRIN XL 300 MG  TB24 (BUPROPION HCL) 1 by mouth once daily  #30 x 3   Entered and Authorized by:   Gordy Savers  MD   Signed by:   Gordy Savers  MD on 08/10/2010   Method used:   Print then Give to Patient   RxID:   [id number] LIPITOR 40 MG TABS (ATORVASTATIN CALCIUM) one daily  #90 x 0   Entered and Authorized by:   Gordy Savers  MD   Signed by:   Gordy Savers  MD on 08/10/2010   Method used:   Electronically to        [facility]* (retail)             ,          Ph: [phone]       Fax: [phone]   RxID:   [id number]    Orders Added: 1)  Est. Patient Level III [72536]

## 2010-10-10 NOTE — Progress Notes (Signed)
Summary: Rxs lunesta and nasonex   Phone Note Call from Patient Call back at Home Phone [phone]   Caller: vm Summary of Call: Need Rxs faxed to [facility] for Lunesta 5mg ? and for Nasonex sample, need Rx for it.   Initial call taken by: Rudy Jew, RN,  December 12, 2009 1:49 PM  Follow-up for Phone Call        dr Scotty Court called pt and to call in lunesta 3 mg and nasonex. Follow-up by: Pura Spice, RN,  December 13, 2009 3:07 PM    New/Updated Medications: LUNESTA 3 MG TABS (ESZOPICLONE) 1 by mouth nightly as needed sleep NASONEX 50 MCG/ACT SUSP (MOMETASONE FUROATE) use 2 sprays each nostril daily Prescriptions: NASONEX 50 MCG/ACT SUSP (MOMETASONE FUROATE) use 2 sprays each nostril daily  #3 x 3   Entered by:   Pura Spice, RN   Authorized by:   Judithann Sheen MD   Signed by:   Pura Spice, RN on 12/13/2009   Method used:   Printed then faxed to ...       [facility]* (mail-order)             ,          Ph: [phone]       Fax: [phone]   RxID:   [id number] LUNESTA 3 MG TABS (ESZOPICLONE) 1 by mouth nightly as needed sleep  #90 x 1   Entered by:   Pura Spice, RN   Authorized by:   Judithann Sheen MD   Signed by:   Pura Spice, RN on 12/13/2009   Method used:   Printed then faxed to ...       [facility]* (mail-order)             ,          Ph: [phone]       Fax: [phone]   RxID:   [id number]

## 2010-10-10 NOTE — Letter (Signed)
Summary: Generic Letter  [facility]  [address] [place], [place] [address]   Phone: [phone]  Fax: [phone]    07/07/2010  [name] [address] [place], [place]  [address]  To Whom It May Concern:  Patient has a very long well documented history of chronic upper back, b/l shoulder, neck pain and migraine headaches. These have all been affected by her long standing trouble with large breasts. She and her PMD, Dr Dianna Limbo, discussed this concern at physical last year and she worked very diligently over this past year to loose weight. She has also been proactive and tried to deal with her chronic pain via Acupuncture, Massage Therapy, and stress reduction by retiring from her job in June of this year. Unfortunately even with a 16 to 20 pound weight loss she continues to have trouble with chronic pain as previously noted.   She has been seen by Dr Odis Luster regarding the possibility of Breast Reduction surgery and at this point is willing to proceed with the reduction as a treatment option to address her ongoing discomfort. After discussion with the patient, exam and review of her medical record I am in agreement and recommend she proceed with the surgical reduction. Please feel free to contact our office if you need any further information             Sincerely,   Danise Edge MD  Appended Document: Generic Letter Please Address to [employer]:

## 2010-10-10 NOTE — Progress Notes (Signed)
Summary: REQUEST FOR APPT.  Phone Note Call from Patient   Caller: Patient Summary of Call: Pt [name]) came by office to adv that Dr Abner Greenspan had told her to f/u with Dr Scotty Court on BP concerns.... Pt needs appt for BP ck asap.... Pt can be reached at [phone].   Initial call taken by: Debbra Riding,  August 10, 2010 11:58 AM  Follow-up for Phone Call        Spoke with Fleet Contras, [place] and Selena Batten, [place].... Pt will be seen by Dr Kirtland Bouchard today at  1:45pm.... Pt aware.  Follow-up by: Debbra Riding,  August 10, 2010 12:18 PM

## 2010-10-12 NOTE — Medication Information (Signed)
Summary: PA & Approval for [facility] & Approval for Lunesta   Imported By: Maryln Gottron 09/12/2010 09:32:30  _____________________________________________________________________  External Attachment:    Type:   Image     Comment:   External Document

## 2010-10-19 ENCOUNTER — Ambulatory Visit (INDEPENDENT_AMBULATORY_CARE_PROVIDER_SITE_OTHER): Payer: 59 | Admitting: Family Medicine

## 2010-10-19 ENCOUNTER — Encounter: Payer: Self-pay | Admitting: Family Medicine

## 2010-10-19 VITALS — BP 166/70 | HR 98 | Temp 98.3°F | Ht 67.0 in | Wt 165.0 lb

## 2010-10-19 DIAGNOSIS — M129 Arthropathy, unspecified: Secondary | ICD-10-CM

## 2010-10-19 DIAGNOSIS — K589 Irritable bowel syndrome without diarrhea: Secondary | ICD-10-CM

## 2010-10-19 DIAGNOSIS — E663 Overweight: Secondary | ICD-10-CM

## 2010-10-19 DIAGNOSIS — N951 Menopausal and female climacteric states: Secondary | ICD-10-CM

## 2010-10-19 DIAGNOSIS — E785 Hyperlipidemia, unspecified: Secondary | ICD-10-CM

## 2010-10-19 DIAGNOSIS — M1990 Unspecified osteoarthritis, unspecified site: Secondary | ICD-10-CM

## 2010-10-19 DIAGNOSIS — Z780 Asymptomatic menopausal state: Secondary | ICD-10-CM

## 2010-10-19 MED ORDER — ATORVASTATIN CALCIUM 20 MG PO TABS: 20.0000 mg | ORAL_TABLET | Freq: Every day | ORAL | Status: DC

## 2010-10-25 ENCOUNTER — Telehealth: Payer: Self-pay | Admitting: Family Medicine

## 2010-10-25 ENCOUNTER — Encounter: Payer: Self-pay | Admitting: Family Medicine

## 2010-10-25 NOTE — Progress Notes (Signed)
  Subjective:    Patient ID: Jeanett Schlein, female    DOB: 1950-11-23, 60 y.o.   MRN: [id number] A 60 year old white married female retired is into discuss her medical problems  and renew her meds her medications. She relates she had a breast reduction 6 weeks ago by Dr. Odis Luster she also relates she has lost 15 pounds oncologist has started her on Prometrium 100 mg of postmenopausal syndrome up-to-date on Pap smear also up-to-date on mammogram colonoscopic exam and other immunizations had flu vaccine on 4 2712 She does nurse to lose more weight and would like to start on phentermine alone with her weight reduction diet.irritable bowel syndrome is very well controlled tinnitus frequent diarrhea. Continues to have arthritic fingers as well as occasional back pain. Blood pressure is controlled at this time HPI    Review of Systems  Constitutional: Negative.   HENT: Negative.   Eyes: Negative.   Cardiovascular: Negative.        Blood pressure well controlled  Gastrointestinal:       Seehistory of present illness  Genitourinary: Negative.   Musculoskeletal: Positive for back pain and arthralgias.  Skin: Negative.   Neurological: Negative.   Hematological: Negative.   Psychiatric/Behavioral: Negative.        Objective:   Physical Examwell-developed well-nourished most pleasant ear eye or nose and throat and a heart normal size no murmurs regular rhythm lung examination negative breasts not examined Abdomen liver spleen and kidneys are nonpalpable no masses normal bowel sounds, well-healed operative scar from previous colon surgery        Assessment & Plan:  Recommended patient continue on weight reduction diet and will prescribe phentermine 37.5 mg each a.m. For 3 months to decrease appetite, discussed postmenopausal syndrom and I agreed with her gynecologist.we discontinued Wellbutrin continue medications for year-old bowel syndrome and Lomotil or diarrhea continue to use hydrocodone for  pain as needed recommended cessation of AcipHex and she has been bleeding off and having no symptoms

## 2010-11-08 NOTE — Progress Notes (Signed)
Discussed with patient

## 2010-11-14 ENCOUNTER — Telehealth: Payer: Self-pay | Admitting: Family Medicine

## 2010-11-14 NOTE — Telephone Encounter (Signed)
Pt needs to have Rx for all meds sent to [facility].... Pt adv that she was in for medication review on 10/19/10 and was supposed to have med refills sent in to [facility] but nothing has been sent as of yet.... Pt adv that she needs a refill for meds sent to [facility] asap x 90 days.... Pt can be reached at [phone].

## 2010-11-15 MED ORDER — FUROSEMIDE 40 MG PO TABS: 40.0000 mg | ORAL_TABLET | Freq: Every day | ORAL | Status: DC

## 2010-11-15 MED ORDER — ATORVASTATIN CALCIUM 20 MG PO TABS: 20.0000 mg | ORAL_TABLET | Freq: Every day | ORAL | Status: DC

## 2010-11-15 MED ORDER — CILIDINIUM-CHLORDIAZEPOXIDE 2.5-5 MG PO CAPS: 1.0000 | ORAL_CAPSULE | Freq: Three times a day (TID) | ORAL | Status: DC

## 2010-11-15 MED ORDER — ESZOPICLONE 1 MG PO TABS: 1.0000 mg | ORAL_TABLET | Freq: Every day | ORAL | Status: DC

## 2010-11-15 MED ORDER — PROGESTERONE MICRONIZED 100 MG PO CAPS: 100.0000 mg | ORAL_CAPSULE | Freq: Every day | ORAL | Status: DC

## 2010-11-15 MED ORDER — RABEPRAZOLE SODIUM 20 MG PO TBEC: 20.0000 mg | DELAYED_RELEASE_TABLET | Freq: Every day | ORAL | Status: DC

## 2010-11-15 NOTE — Telephone Encounter (Signed)
Addended by: Harvie Heck. on: 11/15/2010 04:50 PM   Modules accepted: Orders

## 2010-11-21 MED ORDER — BUPROPION HCL ER (XL) 300 MG PO TB24: 300.0000 mg | ORAL_TABLET | Freq: Every day | ORAL | Status: DC

## 2010-11-21 MED ORDER — PHENTERMINE HCL 37.5 MG PO CAPS: 37.5000 mg | ORAL_CAPSULE | ORAL | Status: AC

## 2010-11-21 MED ORDER — PHENTERMINE HCL 37.5 MG PO TABS: 37.5000 mg | ORAL_TABLET | Freq: Every day | ORAL | Status: AC

## 2011-01-01 NOTE — Telephone Encounter (Signed)
Medications refill

## 2011-01-10 ENCOUNTER — Other Ambulatory Visit (INDEPENDENT_AMBULATORY_CARE_PROVIDER_SITE_OTHER): Payer: 59 | Admitting: Family Medicine

## 2011-01-10 DIAGNOSIS — E785 Hyperlipidemia, unspecified: Secondary | ICD-10-CM

## 2011-01-10 LAB — LIPID PANEL: Cholesterol: 147 mg/dL (ref 0–200)

## 2011-01-18 ENCOUNTER — Encounter: Payer: Self-pay | Admitting: Family Medicine

## 2011-01-18 ENCOUNTER — Ambulatory Visit (INDEPENDENT_AMBULATORY_CARE_PROVIDER_SITE_OTHER): Payer: 59 | Admitting: Family Medicine

## 2011-01-18 VITALS — BP 122/76 | HR 82 | Temp 97.9°F | Wt 167.0 lb

## 2011-01-18 DIAGNOSIS — IMO0001 Reserved for inherently not codable concepts without codable children: Secondary | ICD-10-CM

## 2011-01-18 DIAGNOSIS — M549 Dorsalgia, unspecified: Secondary | ICD-10-CM

## 2011-01-18 DIAGNOSIS — K589 Irritable bowel syndrome without diarrhea: Secondary | ICD-10-CM

## 2011-01-18 DIAGNOSIS — E785 Hyperlipidemia, unspecified: Secondary | ICD-10-CM

## 2011-01-18 DIAGNOSIS — M609 Myositis, unspecified: Secondary | ICD-10-CM

## 2011-01-18 DIAGNOSIS — G8929 Other chronic pain: Secondary | ICD-10-CM

## 2011-01-18 DIAGNOSIS — I10 Essential (primary) hypertension: Secondary | ICD-10-CM

## 2011-01-18 DIAGNOSIS — E669 Obesity, unspecified: Secondary | ICD-10-CM

## 2011-01-18 MED ORDER — HYDROCODONE-ACETAMINOPHEN 10-650 MG PO TABS: ORAL_TABLET | ORAL | Status: DC

## 2011-01-18 MED ORDER — DIPHENOXYLATE-ATROPINE 2.5-0.025 MG PO TABS: 1.0000 | ORAL_TABLET | Freq: Four times a day (QID) | ORAL | Status: DC | PRN

## 2011-01-23 MED ORDER — HYDROCODONE-ACETAMINOPHEN 10-650 MG PO TABS: ORAL_TABLET | ORAL | Status: DC

## 2011-01-26 NOTE — Op Note (Signed)
[place]  Patient:    [name], [name]                        MRN: [id number] Proc. Date: 12/18/99 Adm. Date:  [id number] Attending:  Donn Pierini                           Operative Report  SERVICE:  Neurosurgery.  PREOPERATIVE DIAGNOSIS:  Left C5-6 and left C6-7 herniated nucleus pulposus with radiculopathy.  POSTOPERATIVE DIAGNOSIS:  Left C5-6 and left C6-7 herniated nucleus pulposus with radiculopathy.  PROCEDURE:  C5-6 and C6-7 anterior cervical diskectomies and fusion with allograft and anterior plating.  SURGEON:  Julio Sicks, M.D.  ANESTHESIA:  General orotracheal.  INDICATIONS:  [name] is a 60 year old female with history of neck and left upper extremity pain.  She has some weakness consistent with a left-sided C6 and C7 radiculopathy.  Patient has failed conservative management.  Her MRI scan demonstrates a left-sided C5-6 and C6-7 disc herniation.  Patient presents now or two-level anterior cervical diskectomy and fusion with allograft and anterior plating for hopeful relief of her symptoms.  DESCRIPTION OF PROCEDURE:  Patient was brought to the operating room and placed on operating table in a supine position.  After adequate level of anesthesia was achieved, patient was positioned supine with her neck slightly extended and held in place with 10 pounds of halter traction.  Patients anterior cervical region was  shaved and prepped sterilely.  A 10 blade was used to make a linear skin incision overlying the C5-6.  This was carried down sharply to the platysma.  The platysma was then divided vertically and dissection proceeded along the medial aspect of the sternomastoid muscle and carotid sheath on the right.  Trachea and esophagus were mobilized and retracted towards the left.  Prevertebral fascia was stripped off the anterior spinal column.  The longus colli muscles were then elevated bilaterally. Deep  self-retaining retractor was placed.  X-ray was taken using the fluoroscopy unit, confirming the C5-6 and C6-7 levels.  Disc space was then incised at both  levels using a 15 blade in a rectangular fashion.  A wide disc space cleanout was then achieved using pituitary rongeurs, upward-angled and backward-angled and Carlens curettes, Kerrison rongeurs and a high-speed drill.  All elements of the disc were removed down to the level of the posterior annulus at both levels. Starting first at C5-6, the microscope was brought into the field and used throughout the remaining of the diskectomy.  Remaining aspects of the annulus and osteophytes were removed down along the posterior longitudinal ligament. Posterior longitudinal ligament was then elevated and resected in a piecemeal fashion using Kerrison rongeurs.  The underlying thecal sac was exposed.  Decompression then proceeded out to the left-sided C6 foramen.  The C6 nerve root was identified. A large amount of foraminal disc herniation was encountered and dissected and resected completely.  The C6 nerve root was uncovered distally into is foramen.  There was no evidence of continued compression.  Decompression then proceeded out to the right-sided C6 foramen.  Once again, the C6 nerve root was identified and widely decompressed.  Gelfoam was placed at this level and attention was then placed at the C6-7 level.  Once again, remaining aspects of the annulus and osteophytes were removed down to the level of the posterior longitudinal ligament. The posterior longitudinal ligament was then elevated and  resected in piecemeal  fashion using Kerrison rongeurs.  The underlying thecal sac was identified. Decompression then proceeded out to the left-sided C7 foramen.  The C7 nerve root was identified as was a moderate amount of foraminal disc herniation.  This was  completely resected.  The C7 nerve root was followed out distally in  the foramen and completely decompressed.  At this point, a blunt probe was passed easily along the course of the nerve root.  There was no evidence of any continued compression. Decompression then proceeded out to the right-sided C7 foramen.  The proximal C7 nerve root was identified and found to be free of any compression.  The wound was then irrigated with antibiotic solution.  Gelfoam was placed topically at both levels for hemostasis, which was found to be good.  The C5-6 disc space was then distracted and a 6-mm fibular wedge allograft was impacted into place and recessed approximately 1 mm from the anterior cortical surface.  At C6-7, once again, a -mm fibular wedge allograft was then placed and recessed approximately 2 mm from the anterior cortical surface.  Microscope was removed.  A 42.5-mm Atlantis anterior cervical plate was then placed over the C5, C6 and C7 levels.  This was then attached to the bones at C5, C6 and C7 by placing two 13.0 x 3.5-mm fixed-angle  cancellous bone screws at each level.  Each screw was placed by first drilling  pilot hole, tapping the pilot hole and then subsequently placing the screw. Locking screws were then engaged at all three levels.  Final images revealed good position of the hardware and bone grafts at proper operative levels, with normal alignment of the spine.  Retractors were removed.  Hemostasis in the muscle was  achieved with bipolar electrocautery.  Wound was then copiously irrigated with antibiotic solution.  The wound was then closed in layers with 3-0 Vicryl at the platysma and 5-0 PDS in inverted and subcuticular fashion at the skin. Steri-Strips and sterile dressings were applied.  There were no apparent complications.  Patient tolerated procedure well and she returns to recovery room postoperatively. DD:  12/18/99 TD:  12/19/99 Job: [id number]

## 2011-02-12 ENCOUNTER — Encounter: Payer: Self-pay | Admitting: Family Medicine

## 2011-02-12 NOTE — Patient Instructions (Signed)
I recommend that she continue on the weight reduction diet Also since it the IBS is dynamic control her recommend that she take Librax 4 times daily as well as use Lomotil when needed Refill hydrocodone for your back pain as well as refill on the Lomotil

## 2011-02-12 NOTE — Progress Notes (Signed)
  Subjective:    Patient ID: [name], female    DOB: 09-16-1950, 60 y.o.   MRN: [id number] This 60 year old white divorced female is in for followup examination since been for complete physical in February. Relates she had been having more diarrhea with irritable bowel syndrome and needs a refill on her Lomotil. Has also been having muscle pain and getting multiple centimeter there to wait for an hour however pain is sufficient that she needs an analgesic or Lorcet refilled also would like to continue phentermine to help decrease her appetite She been very happy since she retired and plays golf 2-3 times per week HPI    Review of Systems     Objective:   Physical Exam the patient will well well-nourished attractive white female in no distress heart and lungs no abnormalities Abdomen liver spleen kidneys are nonpalpable tenderness over the large bowel slight increase bowel sounds Spine lumbar muscle are tender and slightly spasm        Assessment & Plan:  Gen. Well syndrome is not under control and recommend change to low roughage diet as well as use Lomotil on rectal basis as well as restart Librax q.i.d. Continue weight reduction diet Myositis continue massages plus hydrocodone for pain

## 2011-05-03 ENCOUNTER — Encounter: Payer: Self-pay | Admitting: Gastroenterology

## 2011-08-03 ENCOUNTER — Other Ambulatory Visit: Payer: Self-pay | Admitting: Obstetrics and Gynecology

## 2011-08-03 DIAGNOSIS — R928 Other abnormal and inconclusive findings on diagnostic imaging of breast: Secondary | ICD-10-CM

## 2011-08-07 ENCOUNTER — Encounter (HOSPITAL_COMMUNITY): Payer: Self-pay | Admitting: Pharmacist

## 2011-08-09 NOTE — Patient Instructions (Addendum)
   Your procedure is scheduled on:  Friday, Dec 7th  Enter through the [employer] of [facility] at: 12 Noon Pick up the phone at the desk and dial [phone] and inform us of your arrival.  Please call this number if you have any problems the morning of surgery: [phone]  Remember: Do not eat food after midnight: Thursday Do not drink clear liquids after: 9:30am Friday Take these medicines the morning of surgery with a SIP OF WATER: Lipitor, Wellbutrin   Do not wear jewelry, make-up, or FINGER nail polish Do not wear lotions, powders, perfumes or deodorant. Do not shave 48 hours prior to surgery. Do not bring valuables to the hospital.  Patients discharged on the day of surgery will not be allowed to drive home.    Remember to use your hibiclens as instructed.Please shower with 1/2 bottle the evening before your surgery and the other 1/2 bottle the morning of surgery.

## 2011-08-10 ENCOUNTER — Encounter (HOSPITAL_COMMUNITY): Payer: Self-pay

## 2011-08-10 ENCOUNTER — Other Ambulatory Visit: Payer: Self-pay

## 2011-08-10 ENCOUNTER — Encounter (HOSPITAL_COMMUNITY)
Admission: RE | Admit: 2011-08-10 | Discharge: 2011-08-10 | Disposition: A | Discharge status: Home / Self Care | Payer: 59 | Source: Ambulatory Visit | Attending: Obstetrics and Gynecology | Admitting: Obstetrics and Gynecology

## 2011-08-10 HISTORY — DX: Other seasonal allergic rhinitis: J30.2

## 2011-08-10 HISTORY — DX: Hyperlipidemia, unspecified: E78.5

## 2011-08-10 HISTORY — DX: Depression, unspecified: F32.A

## 2011-08-10 HISTORY — DX: Major depressive disorder, single episode, unspecified: F32.9

## 2011-08-10 HISTORY — DX: Malignant (primary) neoplasm, unspecified: C80.1

## 2011-08-10 HISTORY — DX: Insomnia, unspecified: G47.00

## 2011-08-10 NOTE — Pre-Procedure Instructions (Signed)
Ok to see patient DOS. 

## 2011-08-14 NOTE — H&P (Signed)
FRANCINE HANNAN  DICTATION # [id number] CSN# [id number]   Meriel Pica, MD 08/14/2011 2:20 PM

## 2011-08-16 ENCOUNTER — Ambulatory Visit
Admission: RE | Admit: 2011-08-16 | Discharge: 2011-08-16 | Disposition: A | Discharge status: Home / Self Care | Payer: 59 | Source: Ambulatory Visit | Attending: Obstetrics and Gynecology | Admitting: Obstetrics and Gynecology

## 2011-08-16 DIAGNOSIS — R928 Other abnormal and inconclusive findings on diagnostic imaging of breast: Secondary | ICD-10-CM

## 2011-08-16 NOTE — H&P (Signed)
[name], [name]               ACCOUNT NO.:  [id number]  MEDICAL RECORD NO.:  [id number]  LOCATION:                                 FACILITY:  PHYSICIAN:  Duke Salvia. Marcelle Overlie, M.D.    DATE OF BIRTH:  DATE OF ADMISSION: DATE OF DISCHARGE:                             HISTORY & PHYSICAL   CHIEF COMPLAINT:  Postmenopausal bleeding.  HISTORY OF PRESENT ILLNESS:  A 60 year old G2, P2, prior tubal postmenopausal patient who has been on HRT since June 21, 2011, Vivelle Dot and Prometrium orally at bedtime.  Due to some irregular bleeding, she underwent [facility] November 13, 2010 in our office that showed an endometrium of 8.8-mm, but was otherwise normal.  She continued to have some irregular postmenopausal bleeding, so endometrial biopsy was carried out November 13, 2010, that showed weakly proliferative endometrium. No hyperplasia or carcinoma.  Due to continued problems with some irregular bleeding on HRT, she presents now for D and C, hysteroscopy. This procedure including risks related to bleeding, infection, other complications that may require open additional surgery all discussed with her, which she understands and accepts.  PAST MEDICAL HISTORY:  ALLERGIES:  NSAIDs.  CURRENT MEDICATIONS:  Lipitor, Wellbutrin, furosemide, Claritin p.r.n., hydrocodone, butalbital, APAP combination for headache along with Phenergan p.r.n., Vivelle Dot and Prometrium.  PRIOR SURGERY:  She has had bunionectomy, back surgery in 2001, neck diskectomy in 2003, knee surgery 3 times on the right.  She has had a total colectomy, breast augmentation, tummy tuck.  Dr. Scotty Court had been her medical doctor until he recently retired.  SOCIAL HISTORY:  Denies drug or tobacco use.  Rare alcohol use.  She is married.  REVIEW OF SYSTEMS:  Significant for a history of headache, arthritis.  FAMILY HISTORY:  Significant for breast, stomach, liver cancer, hypertension, diabetes, arthritis and heart  disease.  PHYSICAL EXAMINATION:  VITAL SIGNS:  Temp 98.2, blood pressure 124/80. HEENT:  Unremarkable. NECK:  Supple without masses. LUNGS:  Clear. CARDIOVASCULAR:  Regular rate and rhythm without murmurs, rubs or gallops. BREASTS:  Without masses. ABDOMEN:  Soft, flat and nontender. PELVIC:  Normal external genitalia.  Vagina and cervix were clear. Uterus mid position, normal size.  Last Pap June 22, 2011, normal. Adnexa negative. EXTREMITIES:  Unremarkable. NEUROLOGIC:  Unremarkable.  IMPRESSION:  Postmenopausal bleeding on HRT, prior negative office biopsy 3/12.  PLAN:  D and C, hysteroscopy.  Procedure and risks discussed as above.     [name] M. Marcelle Overlie, M.D.     [facility]  D:  08/14/2011  T:  08/15/2011  Job:  [id number]

## 2011-08-17 ENCOUNTER — Encounter (HOSPITAL_COMMUNITY)
Admission: RE | Disposition: A | Discharge status: Home / Self Care | Payer: Self-pay | Source: Ambulatory Visit | Attending: Obstetrics and Gynecology

## 2011-08-17 ENCOUNTER — Encounter (HOSPITAL_COMMUNITY): Payer: Self-pay | Admitting: Anesthesiology

## 2011-08-17 ENCOUNTER — Ambulatory Visit (HOSPITAL_COMMUNITY): Payer: 59 | Admitting: Anesthesiology

## 2011-08-17 ENCOUNTER — Ambulatory Visit (HOSPITAL_COMMUNITY)
Admission: RE | Admit: 2011-08-17 | Discharge: 2011-08-17 | Disposition: A | Discharge status: Home / Self Care | Payer: 59 | Source: Ambulatory Visit | Attending: Obstetrics and Gynecology | Admitting: Obstetrics and Gynecology

## 2011-08-17 ENCOUNTER — Other Ambulatory Visit: Payer: Self-pay | Admitting: Obstetrics and Gynecology

## 2011-08-17 DIAGNOSIS — N950 Postmenopausal bleeding: Secondary | ICD-10-CM | POA: Insufficient documentation

## 2011-08-17 DIAGNOSIS — Z01812 Encounter for preprocedural laboratory examination: Secondary | ICD-10-CM | POA: Insufficient documentation

## 2011-08-17 DIAGNOSIS — Z01818 Encounter for other preprocedural examination: Secondary | ICD-10-CM | POA: Insufficient documentation

## 2011-08-17 HISTORY — PX: HYSTEROSCOPY WITH D & C: SHX1775

## 2011-08-17 SURGERY — DILATATION AND CURETTAGE /HYSTEROSCOPY
Anesthesia: General | Site: Vagina | Wound class: Clean Contaminated

## 2011-08-17 MED ORDER — ONDANSETRON HCL 4 MG/2ML IJ SOLN
INTRAMUSCULAR | Status: AC
  Filled 2011-08-17: qty 2

## 2011-08-17 MED ORDER — DEXAMETHASONE SODIUM PHOSPHATE 4 MG/ML IJ SOLN
INTRAMUSCULAR | Status: DC | PRN
  Administered 2011-08-17: 10 mg via INTRAVENOUS

## 2011-08-17 MED ORDER — GLYCOPYRROLATE 0.2 MG/ML IJ SOLN
INTRAMUSCULAR | Status: AC
  Filled 2011-08-17: qty 1

## 2011-08-17 MED ORDER — MEPERIDINE HCL 25 MG/ML IJ SOLN: 6.2500 mg | INTRAMUSCULAR | Status: DC | PRN

## 2011-08-17 MED ORDER — HYDROMORPHONE HCL PF 1 MG/ML IJ SOLN
INTRAMUSCULAR | Status: DC | PRN
  Administered 2011-08-17 (×2): 0.5 mg via INTRAVENOUS

## 2011-08-17 MED ORDER — MIDAZOLAM HCL 5 MG/5ML IJ SOLN
INTRAMUSCULAR | Status: DC | PRN
  Administered 2011-08-17: 2 mg via INTRAVENOUS

## 2011-08-17 MED ORDER — PROPOFOL 10 MG/ML IV EMUL
INTRAVENOUS | Status: AC
  Filled 2011-08-17: qty 20

## 2011-08-17 MED ORDER — PROPOFOL 10 MG/ML IV EMUL
INTRAVENOUS | Status: DC | PRN
  Administered 2011-08-17: 150 mg via INTRAVENOUS

## 2011-08-17 MED ORDER — FENTANYL CITRATE 0.05 MG/ML IJ SOLN
INTRAMUSCULAR | Status: DC | PRN
  Administered 2011-08-17 (×2): 50 ug via INTRAVENOUS

## 2011-08-17 MED ORDER — LIDOCAINE HCL (CARDIAC) 20 MG/ML IV SOLN
INTRAVENOUS | Status: DC | PRN
  Administered 2011-08-17: 60 mg via INTRAVENOUS

## 2011-08-17 MED ORDER — DEXAMETHASONE SODIUM PHOSPHATE 10 MG/ML IJ SOLN
INTRAMUSCULAR | Status: AC
  Filled 2011-08-17: qty 1

## 2011-08-17 MED ORDER — FENTANYL CITRATE 0.05 MG/ML IJ SOLN: 25.0000 ug | INTRAMUSCULAR | Status: DC | PRN

## 2011-08-17 MED ORDER — LACTATED RINGERS IV SOLN
INTRAVENOUS | Status: DC
  Administered 2011-08-17: 13:00:00 via INTRAVENOUS

## 2011-08-17 MED ORDER — MIDAZOLAM HCL 2 MG/2ML IJ SOLN
INTRAMUSCULAR | Status: AC
  Filled 2011-08-17: qty 2

## 2011-08-17 MED ORDER — ONDANSETRON HCL 4 MG/2ML IJ SOLN
INTRAMUSCULAR | Status: DC | PRN
  Administered 2011-08-17: 4 mg via INTRAVENOUS

## 2011-08-17 MED ORDER — FENTANYL CITRATE 0.05 MG/ML IJ SOLN
INTRAMUSCULAR | Status: AC
  Filled 2011-08-17: qty 2

## 2011-08-17 MED ORDER — KETOROLAC TROMETHAMINE 60 MG/2ML IM SOLN
INTRAMUSCULAR | Status: AC
  Filled 2011-08-17: qty 2

## 2011-08-17 MED ORDER — LIDOCAINE HCL (CARDIAC) 20 MG/ML IV SOLN
INTRAVENOUS | Status: AC
  Filled 2011-08-17: qty 5

## 2011-08-17 MED ORDER — HYDROMORPHONE HCL PF 1 MG/ML IJ SOLN
INTRAMUSCULAR | Status: AC
  Filled 2011-08-17: qty 1

## 2011-08-17 MED ORDER — ONDANSETRON HCL 4 MG/2ML IJ SOLN: 4.0000 mg | Freq: Once | INTRAMUSCULAR | Status: DC | PRN

## 2011-08-17 MED ORDER — LIDOCAINE HCL 1 % IJ SOLN
INTRAMUSCULAR | Status: DC | PRN
  Administered 2011-08-17: 10 mL

## 2011-08-17 SURGICAL SUPPLY — 13 items
CANISTER SUCTION 2500CC (MISCELLANEOUS) ×2 IMPLANT
CATH ROBINSON RED A/P 16FR (CATHETERS) ×2 IMPLANT
CLOTH BEACON ORANGE TIMEOUT ST (SAFETY) ×2 IMPLANT
CONTAINER PREFILL 10% NBF 60ML (FORM) ×4 IMPLANT
ELECT REM PT RETURN 9FT ADLT (ELECTROSURGICAL)
ELECTRODE REM PT RTRN 9FT ADLT (ELECTROSURGICAL) IMPLANT
GLOVE BIO SURGEON STRL SZ7 (GLOVE) ×4 IMPLANT
GOWN PREVENTION PLUS LG XLONG (DISPOSABLE) ×4 IMPLANT
GOWN STRL REIN XL XLG (GOWN DISPOSABLE) ×2 IMPLANT
LOOP ANGLED CUTTING 22FR (CUTTING LOOP) IMPLANT
PACK HYSTEROSCOPY LF (CUSTOM PROCEDURE TRAY) ×2 IMPLANT
TOWEL OR 17X24 6PK STRL BLUE (TOWEL DISPOSABLE) ×4 IMPLANT
WATER STERILE IRR 1000ML POUR (IV SOLUTION) ×2 IMPLANT

## 2011-08-17 NOTE — Anesthesia Preprocedure Evaluation (Signed)
Anesthesia Evaluation  Patient identified by MRN, date of birth, ID band Patient awake    Reviewed: Allergy & Precautions, H&P , NPO status , Patient's Chart, lab work & pertinent test results  Airway Mallampati: I TM Distance: >3 FB Neck ROM: full    Dental No notable dental hx. (+) Teeth Intact   Pulmonary neg pulmonary ROS,  clear to auscultation  Pulmonary exam normal       Cardiovascular neg cardio ROS     Neuro/Psych PSYCHIATRIC DISORDERS Depression    GI/Hepatic Neg liver ROS, GERD-  ,  Endo/Other  Negative Endocrine ROS  Renal/GU negative Renal ROS  Genitourinary negative   Musculoskeletal negative musculoskeletal ROS (+)   Abdominal Normal abdominal exam  (+)   Peds negative pediatric ROS (+)  Hematology negative hematology ROS (+)   Anesthesia Other Findings   Reproductive/Obstetrics negative OB ROS                           Anesthesia Physical Anesthesia Plan  ASA: II  Anesthesia Plan: General   Post-op Pain Management:    Induction: Intravenous  Airway Management Planned: LMA  Additional Equipment:   Intra-op Plan:   Post-operative Plan:   Informed Consent: I have reviewed the patients History and Physical, chart, labs and discussed the procedure including the risks, benefits and alternatives for the proposed anesthesia with the patient or authorized representative who has indicated his/her understanding and acceptance.     Plan Discussed with: CRNA  Anesthesia Plan Comments:         Anesthesia Quick Evaluation

## 2011-08-17 NOTE — Progress Notes (Signed)
The patient was re-examined with no change in status 

## 2011-08-17 NOTE — Transfer of Care (Signed)
Immediate Anesthesia Transfer of Care Note  Patient: [name]  Procedure(s) Performed:  DILATATION AND CURETTAGE (D&C) /HYSTEROSCOPY  Patient Location: PACU  Anesthesia Type: General  Level of Consciousness: awake, alert  and oriented  Airway & Oxygen Therapy: Patient Spontanous Breathing and Patient connected to nasal cannula oxygen  Post-op Assessment: Report given to PACU RN and Post -op Vital signs reviewed and stable  Post vital signs: Reviewed and stable  Complications: No apparent anesthesia complications

## 2011-08-17 NOTE — Anesthesia Procedure Notes (Signed)
Procedure Name: LMA Insertion Date/Time: 08/17/2011 1:21 PM Performed by: Karleen Dolphin Pre-anesthesia Checklist: Patient identified, Patient being monitored, Emergency Drugs available, Timeout performed and Suction available Patient Re-evaluated:Patient Re-evaluated prior to inductionOxygen Delivery Method: Circle System Utilized Preoxygenation: Pre-oxygenation with 100% oxygen Intubation Type: IV induction Ventilation: Mask ventilation without difficulty LMA: LMA inserted LMA Size: 4.0 Number of attempts: 1 Placement Confirmation: positive ETCO2 and breath sounds checked- equal and bilateral Tube secured with: Tape Dental Injury: Teeth and Oropharynx as per pre-operative assessment

## 2011-08-17 NOTE — Op Note (Signed)
Preoperative diagnosis: Postmenopausal bleeding  Postoperative diagnosis: Same  Procedure: D&C, hysteroscopy  Surgeon: Marcelle Overlie  Anesthesia: Gen.  Specimens removed: Dmytro curettings, to pathology  EBL: Minimal  Complications: None  Procedure and findings:  The patient was taken to the operating room after an adequate level of general anesthesia was obtained with the legs in stirrups the perineum and vagina were prepped and draped in the usual manner for vaginal procedures, Foley catheter in and out fashion used to drain the bladder he UA carried out uterus was normal size mid to posterior adnexa negative, mobile. Speculum was positioned cervix grasped with tenaculum paracervical block created by infiltrating at 3 and 9:00 submucosally, 5-7 cc of 1% Xylocaine on either side after negative aspiration. The uterus was then sounded to 8 cm, progressively dilated to a 27 Pratt dilator, the continuous flow diagnostic hysteroscope was then used to explore the cavity revealing no unusual buildup, no evidence of polyps at the fundus. Once this was noted sharp curettage was carried out only very scant tissue, this was all sent to pathology she tolerated this well went to recovery room in good condition.  Lestine Rahe M. Milana Obey.D.

## 2011-08-17 NOTE — Anesthesia Postprocedure Evaluation (Signed)
Anesthesia Post Note  Patient: [name]  Procedure(s) Performed:  DILATATION AND CURETTAGE (D&C) /HYSTEROSCOPY  Anesthesia type: General  Patient location: PACU  Post pain: Pain level controlled  Post assessment: Post-op Vital signs reviewed  Last Vitals:  Filed Vitals:   08/17/11 1425  BP:   Pulse: 71  Temp:   Resp: 16    Post vital signs: Reviewed  Level of consciousness: sedated  Complications: No apparent anesthesia complicationsfj

## 2011-08-20 ENCOUNTER — Encounter (HOSPITAL_COMMUNITY): Payer: Self-pay | Admitting: Obstetrics and Gynecology

## 2011-12-14 ENCOUNTER — Telehealth: Payer: Self-pay | Admitting: Family Medicine

## 2011-12-14 NOTE — Telephone Encounter (Signed)
Pt called and has sch an ov with Dr Felicity Coyer in Sept, at drs first avail appt. Pt said that she is going to need refills of atorvastatin (LIPITOR) 20 MG tablet,  buPROPion (WELLBUTRIN XL) 300 MG 24 hr tablet,  furosemide (LASIX) 40 MG tablet, to [facility] mail order for 90 day supply to last until her appt date.

## 2011-12-17 NOTE — Telephone Encounter (Signed)
She surely has appt before September.  Go ahead and refill for 2 months and see if they can get her appt within this time frame.

## 2011-12-17 NOTE — Telephone Encounter (Signed)
Pt last seen 01/18/11.  Pls advise.

## 2011-12-18 MED ORDER — ATORVASTATIN CALCIUM 20 MG PO TABS: 20.0000 mg | ORAL_TABLET | Freq: Every day | ORAL | Status: DC

## 2011-12-18 MED ORDER — FUROSEMIDE 40 MG PO TABS: 40.0000 mg | ORAL_TABLET | Freq: Every day | ORAL | Status: DC

## 2011-12-18 MED ORDER — BUPROPION HCL ER (XL) 300 MG PO TB24: 300.0000 mg | ORAL_TABLET | Freq: Every day | ORAL | Status: DC

## 2011-12-18 NOTE — Telephone Encounter (Signed)
Schedule f/u here with someone until she can be seen there.

## 2011-12-18 NOTE — Telephone Encounter (Signed)
Rx have been sent to pharmacy.  Pls see if pt's appt can be moved up.

## 2011-12-18 NOTE — Telephone Encounter (Signed)
I called & spoke with [name] at [facility]. Dr. Felicity Coyer only sees one new pt per day. Ms. Outten did not call to set up an appt to est with El[facility] until 12/14/11. Dr. Felicity Coyer really is booking into September.

## 2011-12-19 NOTE — Telephone Encounter (Signed)
Pls schedule fu

## 2011-12-20 NOTE — Telephone Encounter (Signed)
I spoke with [name] today. Please come check with me so I can give you the details on scheduling a f/u appt.

## 2012-01-23 ENCOUNTER — Encounter: Payer: Self-pay | Admitting: Gastroenterology

## 2012-01-23 ENCOUNTER — Encounter (HOSPITAL_COMMUNITY): Payer: Self-pay

## 2012-01-23 ENCOUNTER — Emergency Department (HOSPITAL_COMMUNITY)
Admission: EM | Admit: 2012-01-23 | Discharge: 2012-01-23 | Disposition: A | Discharge status: Home / Self Care | Payer: 59 | Source: Home / Self Care | Attending: Emergency Medicine | Admitting: Emergency Medicine

## 2012-01-23 DIAGNOSIS — A09 Infectious gastroenteritis and colitis, unspecified: Secondary | ICD-10-CM

## 2012-01-23 LAB — POCT I-STAT, CHEM 8
BUN: 13 mg/dL (ref 6–23)
Chloride: 100 mEq/L (ref 96–112)
Glucose, Bld: 97 mg/dL (ref 70–99)
HCT: 54 % — ABNORMAL HIGH (ref 36.0–46.0)
Potassium: 3.9 mEq/L (ref 3.5–5.1)

## 2012-01-23 MED ORDER — SULFAMETHOXAZOLE-TRIMETHOPRIM 800-160 MG PO TABS: 1.0000 | ORAL_TABLET | Freq: Two times a day (BID) | ORAL | Status: AC

## 2012-01-23 MED ORDER — ONDANSETRON 8 MG PO TBDP: ORAL_TABLET | ORAL | Status: AC

## 2012-01-23 NOTE — Discharge Instructions (Signed)
Do not take the Imodium and Lomotil, as they both are the same thing. Make sure you finish all Bactrim, even if you feel better. Continue the probiotics. They will replace that the bacteria. Followup with the GI specialist as scheduled. Below is a list of additional primary care practices, in case you cannot be seen in a timely fashion by a primary care physician.  [facility]: [address] [place] [address] [phone]  [facility]: [address] [place] [address]   [phone]  [facility]: [address] [place] [address] [phone]  [facility] : [address] [place] [address] [phone]  [facility]: [address] [place] [address] [phone]  [facility]: [address] [place] [address] [phone]  Dr. Oneal Grout [address] [facility] [place] [address] [phone]

## 2012-01-23 NOTE — ED Provider Notes (Signed)
History     CSN: [id number]  Arrival date & time 01/23/12  1315   First MD Initiated Contact with Patient 01/23/12 1318      Chief Complaint  Patient presents with  . Diarrhea    (Consider location/radiation/quality/duration/timing/severity/associated sxs/prior treatment) HPI Comments: Patient reports 10-15 episodes of watery, nonbloody diarrhea for the past 6 days. States her symptoms started after eating some shellfish, and some questionable seafood. Reports several episodes of emesis during this time, but is tolerating by mouth. Took half of a Cipro the first day, and then took Cipro 500 mg twice a day for about 2 days with some improvement in her symptoms. Has also taking Imodium, Lomotil. States she feels slightly weak, but No fevers, decreased urine output, presyncope, syncope.  No abdominal distention, bloating. No increased flatulence. No  antibiotic use prior to her symptoms starting, known sick contacts, camping, other questionable leftovers. Patient has a remote history of colectomy due to ischemic bowel from severe constipation. No other abdominal surgeries. She's not been taking her Lasix during this time.  ROS as noted in HPI. All other ROS negative.   Patient is a 61 y.o. female presenting with diarrhea. The history is provided by the patient. No language interpreter was used.  Diarrhea The primary symptoms include diarrhea. The illness began 6 to 7 days ago. The onset was sudden. The problem has not changed since onset. The illness does not include chills, anorexia, dysphagia, odynophagia, bloating, constipation or back pain. Significant associated medical issues include bowel resection.    Past Medical History  Diagnosis Date  . GERD (gastroesophageal reflux disease)   . Allergy   . Obesity   . Hyperlipidemia   . Depression   . Insomnia   . Seasonal allergies   . Hypertension     on lasix but per patient not used for HTN   . Abnormal uterine bleeding   .  Headache     history - last one one 06/2011  . Cancer     skin - removed by derm.  . Arthritis     knees, hands    Past Surgical History  Procedure Date  . Bunionectomy     right  and left foot  . Neck fusion 12/18/1999    C5/6 and C6/7  . Breast surgery 1983    augmentation  . Breast reduction surgery 08/2010    and removal of implants  . Eye surgery 2009    lasix eye - bilateral  . Back surgery     lower back  . Colectomy   . Knee surgery     right knee arthroscopy x 2, and total reconstruction right knee  . Right thumb surgery   . Left foot surgery     removed foreign object  . Abdominoplasty   . Eyelid surgery   . Endosocpy   . Colonscopy   . Hysteroscopy w/d&c 08/17/2011    Procedure: DILATATION AND CURETTAGE (D&C) /HYSTEROSCOPY;  Surgeon: Meriel Pica;  Location: [facility];  Service: Gynecology;  Laterality: N/A;    Family History  Problem Relation Age of Onset  . Hypertension Mother     History  Substance Use Topics  . Smoking status: Never Smoker   . Smokeless tobacco: Never Used  . Alcohol Use: 0.0 oz/week     socially    OB History    Grav Para Term Preterm Abortions TAB SAB Ect Mult Living  Review of Systems  Constitutional: Negative for chills.  Gastrointestinal: Positive for diarrhea. Negative for dysphagia, constipation, bloating and anorexia.  Musculoskeletal: Negative for back pain.    Allergies  Nsaids  Home Medications   Current Outpatient Rx  Name Route Sig Dispense Refill  . BUPROPION HCL ER (XL) 300 MG PO TB24 Oral Take 1 tablet (300 mg total) by mouth daily. 30 tablet 2  . CALCIUM CITRATE-VITAMIN D 315-200 MG-UNIT PO TABS Oral Take 1 tablet by mouth daily.      Marland Kitchen VITAMIN D 1000 UNITS PO TABS Oral Take 5,000 Units by mouth daily.      Marland Kitchen DIPHENHYDRAMINE-APAP (SLEEP) 25-500 MG PO TABS Oral Take 1 tablet by mouth at bedtime as needed. sleep     . ESTRADIOL 0.05 MG/24HR TD PTTW Transdermal Place 1 patch onto the  skin 2 (two) times a week. Change on Wednesday and Saturday    . FUROSEMIDE 40 MG PO TABS Oral Take 1 tablet (40 mg total) by mouth daily. 30 tablet 2  . PROGESTERONE MICRONIZED 100 MG PO CAPS Oral Take 100 mg by mouth at bedtime.      . ATORVASTATIN CALCIUM 20 MG PO TABS Oral Take 1 tablet (20 mg total) by mouth daily. 30 tablet 2  . ESZOPICLONE 1 MG PO TABS Oral Take 1 mg by mouth at bedtime. Take immediately before bedtime     . GLUCOSAMINE-CHONDROITIN-MSM 500-400-125 MG PO TABS Oral Take 1 tablet by mouth daily. Triflex supplement     . HYDROCODONE-ACETAMINOPHEN 10-650 MG PO TABS Oral Take 0.5 tablets by mouth every 4 (four) hours as needed. For back pain or headache. Do not exceed 3 tabs per day.     Marland Kitchen LORATADINE 10 MG PO TABS Oral Take 10 mg by mouth daily.     . MOMETASONE FUROATE 50 MCG/ACT NA SUSP Nasal Place 2 sprays into the nose daily.     Marland Kitchen ONDANSETRON 8 MG PO TBDP  1/2- 1 tablet q 8 hr prn nausea, vomiting 20 tablet 0  . SIMETHICONE 125 MG PO CHEW Oral Chew 125-250 mg by mouth daily.      . SULFAMETHOXAZOLE-TRIMETHOPRIM 800-160 MG PO TABS Oral Take 1 tablet by mouth 2 (two) times daily. X 3 days 6 tablet 0    BP 136/85  Pulse 92  Temp(Src) 98.7 F (37.1 C) (Oral)  Resp 18  SpO2 98%   Physical Exam  Nursing note and vitals reviewed. Constitutional: She is oriented to person, place, and time. She appears well-developed and well-nourished.  HENT:  Head: Normocephalic and atraumatic.  Eyes: Conjunctivae and EOM are normal.  Neck: Normal range of motion.  Cardiovascular: Normal rate, regular rhythm, normal heart sounds and intact distal pulses.   Pulmonary/Chest: Effort normal and breath sounds normal.  Abdominal: Soft. Bowel sounds are normal. She exhibits no distension. There is no hepatosplenomegaly. There is no tenderness. There is no rebound, no guarding, no CVA tenderness, no tenderness at McBurney's point and negative Murphy's sign.       Well-healed surgical scars.     Genitourinary: Rectum normal. Rectal exam shows no external hemorrhoid, no internal hemorrhoid and no fissure.       Hemoccult weakly positive no gross blood  Musculoskeletal: Normal range of motion. She exhibits no edema and no tenderness.  Neurological: She is alert and oriented to person, place, and time.  Skin: Skin is warm and dry.  Psychiatric: She has a normal mood and affect. Her behavior is normal. Judgment  and thought content normal.    ED Course  Procedures (including critical care time)  Labs Reviewed  POCT I-STAT, CHEM 8 - Abnormal; Notable for the following:    Hemoglobin 18.4 (*)    HCT 54.0 (*)    All other components within normal limits  LAB REPORT - SCANNED   No results found.   1. Infectious diarrhea    Results for orders placed during the hospital encounter of 01/23/12  POCT I-STAT, CHEM 8      Component Value Range   Sodium 138  135 - 145 (mEq/L)   Potassium 3.9  3.5 - 5.1 (mEq/L)   Chloride 100  96 - 112 (mEq/L)   BUN 13  6 - 23 (mg/dL)   Creatinine, Ser 4.78  0.50 - 1.10 (mg/dL)   Glucose, Bld 97  70 - 99 (mg/dL)   Calcium, Ion 2.95  6.21 - 1.32 (mmol/L)   TCO2 28  0 - 100 (mmol/L)   Hemoglobin 18.4 (*) 12.0 - 15.0 (g/dL)   HCT 30.8 (*) 65.7 - 46.0 (%)     MDM  Checking i-STAT to rule out hypokinemia given patient's history of extensive diarrhea. Hemoccult is weakly positive, suspect that this is from the extensive diarrhea. Pt abd exam is benign, no peritoneal signs. No evidence of surgical abd. Doubt SBO, mesenteric ischemia, appendicitis, hepatitis, cholecystitis, pancreatitis, or perforated viscus. No evidence to support or suggest GYN pathology such as ovarian torsion or infection.   i-STAT noted. Patient hemoconcentrated, but electrolytes and kidney function normal. Patient is tolerating by mouth. Will send her home with Bactrim, as patient has partially treated herself with Cipro. Will also have her start probiotics. Also send home with some  Zofran for nausea. She'll followup with her Dr. in several days. Discussed signs and symptoms that would prompt a return to the ED. Patient agrees with plan.     Luiz Blare, MD 01/24/12 [phone]

## 2012-01-23 NOTE — ED Notes (Signed)
Had an episode of diarrhea and vomiting after eating shellfish in [place] over the weekend, better after a few hours, as has returned x 2 episodes since then; husband travels a lot on business, so has cipro and imodium at home which did little to care for her syx. As she had a colectomy in 1986, she has 'nearly no colon' and has frequent diarrhea, but this has caused her to have soiling of her clothes

## 2012-02-07 ENCOUNTER — Encounter: Payer: Self-pay | Admitting: *Deleted

## 2012-02-11 ENCOUNTER — Encounter: Payer: Self-pay | Admitting: *Deleted

## 2012-02-14 ENCOUNTER — Ambulatory Visit: Payer: 59 | Admitting: Gastroenterology

## 2012-03-19 ENCOUNTER — Encounter: Payer: Self-pay | Admitting: Physician Assistant

## 2012-03-19 ENCOUNTER — Ambulatory Visit (INDEPENDENT_AMBULATORY_CARE_PROVIDER_SITE_OTHER): Payer: 59 | Admitting: Physician Assistant

## 2012-03-19 VITALS — BP 138/82 | HR 86 | Temp 98.3°F | Resp 16 | Ht 67.0 in | Wt 164.8 lb

## 2012-03-19 DIAGNOSIS — H109 Unspecified conjunctivitis: Secondary | ICD-10-CM

## 2012-03-19 DIAGNOSIS — J40 Bronchitis, not specified as acute or chronic: Secondary | ICD-10-CM

## 2012-03-19 DIAGNOSIS — R059 Cough, unspecified: Secondary | ICD-10-CM

## 2012-03-19 DIAGNOSIS — R05 Cough: Secondary | ICD-10-CM

## 2012-03-19 MED ORDER — OFLOXACIN 0.3 % OP SOLN: OPHTHALMIC | Status: DC

## 2012-03-19 MED ORDER — AZITHROMYCIN 250 MG PO TABS: ORAL_TABLET | ORAL | Status: AC

## 2012-03-19 MED ORDER — HYDROCODONE-HOMATROPINE 5-1.5 MG/5ML PO SYRP: ORAL_SOLUTION | ORAL | Status: AC

## 2012-03-19 NOTE — Progress Notes (Signed)
Patient ID: [name] MRN: [id number], DOB: December 26, 1950, 61 y.o. Date of Encounter: 03/19/2012, 9:27 AM  Primary Physician: No primary provider on file.  Chief Complaint: cough and conjunctivitis   HPI: 61 y.o. year old female with history below presents with one week history of symptoms that began with sore throat, nasal congestion, cough, then progressed to left eye discharge, injection, and matting when she woke up the previous day, followed by similar symptoms in the right eye this morning when she woke, up. Her sore throat has improved, but still feels like there is post nasal drip causing her cough. Her vision is at baseline, as she had Lasik corrective surgery 2 years prior with the left eye set for distance vision and the right eye set as close vision. No pain in the eyes. Eyes are pruritic and with clear discharge.  Her cough is non productive, and worse at night time secondary to her post nasal drip. No SOB, chest pain, wheezing, palpitations, or edema. Afebrile. Has upcoming travel to [facility] to play golf in a week.   No sedentary periods, or recent surgery.    Past Medical History  Diagnosis Date  . GERD (gastroesophageal reflux disease)   . Allergy   . Obesity   . Hyperlipidemia   . Depression   . Insomnia   . Seasonal allergies   . Hypertension     on lasix but per patient not used for HTN   . Abnormal uterine bleeding   . Headache     history - last one one 06/2011  . Skin cancer   . Arthritis     knees, hands  . Irritable bowel syndrome   . Actinic keratosis 07/06/2010    Qualifier: Diagnosis of  By: Abner Greenspan MD, Misty Stanley    . BACK PAIN, CHRONIC 02/17/2008    Qualifier: Diagnosis of  By: Alphonzo Severance MD, Loni Dolly CARCINOMA, SKIN, SQUAMOUS CELL 07/06/2010    Qualifier: Diagnosis of  By: Abner Greenspan MD, Misty Stanley    . DEPRESSION 06/08/2009    Qualifier: Diagnosis of  By: Alphonzo Severance MD, Loni Dolly   . Diarrhea 06/08/2009    Qualifier: Diagnosis of  By: Alphonzo Severance MD,  Loni Dolly ELEVATED BLOOD PRESSURE WITHOUT DIAGNOSIS OF HYPERTENSION 08/10/2010    Qualifier: Diagnosis of  By: Amador Cunas  MD, Janett Labella   . HYPERLIPIDEMIA, CONTROLLED 06/08/2009    Qualifier: Diagnosis of  By: Alphonzo Severance MD, Loni Dolly   . HYPERTENSION 02/05/2007    Qualifier: Diagnosis of  By: Lillia Mountain RN, Brien Few MIGRAINE HEADACHE 02/05/2007    Qualifier: Diagnosis of  By: Lillia Mountain RN, Brien Few NEOPLASM, MALIGNANT, BASAL CELL, CARCINOMA, HX OF 07/06/2010    Qualifier: Diagnosis of  By: Abner Greenspan MD, Misty Stanley    . PLANTAR FASCIITIS, RIGHT 06/08/2009    Qualifier: Diagnosis of  By: Alphonzo Severance MD, Loni Dolly POSTMENOPAUSAL STATUS 06/08/2009    Qualifier: Diagnosis of  By: Alphonzo Severance MD, Loni Dolly      Home Meds: Prior to Admission medications   Medication Sig Start Date End Date Taking? Authorizing Provider  acetaminophen (TYLENOL) 500 MG tablet Take 500 mg by mouth every 6 (six) hours as needed. 2 or 3 tablets as needed for headache or pain   Yes Historical Provider, MD  atorvastatin (LIPITOR) 20 MG tablet Take 1 tablet (20 mg total) by mouth daily. 12/18/11  Yes Bruce Colletta [place],  MD  buPROPion (WELLBUTRIN XL) 300 MG 24 hr tablet Take 1 tablet (300 mg total) by mouth daily. 12/18/11  Yes Kristian Covey, MD  calcium citrate-vitamin D (CITRACAL+D) 315-200 MG-UNIT per tablet Take 1 tablet by mouth daily.     Yes Historical Provider, MD  cholecalciferol (VITAMIN D) 1000 UNITS tablet Take 5,000 Units by mouth daily.     Yes Historical Provider, MD  DiphenhydrAMINE HCl, Sleep, ([facility] SLEEP AID PO) Take 1 tablet by mouth at bedtime.   Yes Historical Provider, MD  estradiol (VIVELLE-DOT) 0.05 MG/24HR Place 1 patch onto the skin 2 (two) times a week. Change on Wednesday and Saturday   Yes Historical Provider, MD  furosemide (LASIX) 40 MG tablet Take 1 tablet (40 mg total) by mouth daily. 12/18/11  Yes Kristian Covey, MD  HYDROcodone-acetaminophen (LORCET) 10-650 MG per tablet Take 0.5 tablets by mouth  every 4 (four) hours as needed. For back pain or headache. Do not exceed 3 tabs per day.  01/18/11  Yes Damian Leavell., MD  loratadine (CLARITIN) 10 MG tablet Take 10 mg by mouth daily.    Yes Historical Provider, MD  mometasone (NASONEX) 50 MCG/ACT nasal spray Place 2 sprays into the nose daily.    Yes Historical Provider, MD  OVER THE COUNTER MEDICATION Take 1 tablet by mouth daily. triflex   Yes Historical Provider, MD  progesterone (PROMETRIUM) 100 MG capsule Take 100 mg by mouth at bedtime.   11/15/10  Yes Damian Leavell., MD  promethazine (PHENERGAN) 25 MG tablet Take 25 mg by mouth every 6 (six) hours as needed. 1/4 to 1/2 tablet as needed for nausea/migraines   Yes Historical Provider, MD  simethicone (MYLICON) 125 MG chewable tablet Chew 125-250 mg by mouth daily.     Yes Historical Provider, MD         diphenhydramine-acetaminophen (TYLENOL PM) 25-500 MG TABS Take 1 tablet by mouth at bedtime as needed. sleep     Historical Provider, MD  eszopiclone (LUNESTA) 1 MG TABS Take 1 mg by mouth at bedtime. Take immediately before bedtime     Damian Leavell., MD  Glucosamine-Chondroitin-MSM [phone] MG TABS Take 1 tablet by mouth daily. Triflex supplement     Historical Provider, MD                  Allergies:  Allergies  Allergen Reactions  . Nsaids Other (See Comments)    Hurts stomach    History   Social History  . Marital Status: Married    Spouse Name: N/A    Number of Children: N/A  . Years of Education: N/A   Occupational History  . Not on file.   Social History Main Topics  . Smoking status: Never Smoker   . Smokeless tobacco: Never Used  . Alcohol Use: 0.0 oz/week     socially  . Drug Use: Yes    Special: Hydrocodone  . Sexually Active: Yes    Birth Control/ Protection: Post-menopausal, Surgical   Other Topics Concern  . Not on file   Social History Narrative  . No narrative on file     Review of Systems: Constitutional:  negative for chills, fever, night sweats, weight changes, or fatigue  HEENT: see above Cardiovascular: negative for chest pain or palpitations Respiratory: negative for hemoptysis, wheezing, or shortness of breath Abdominal: negative for abdominal pain, nausea, vomiting, diarrhea, or constipation Dermatological: negative for rash Neurologic: negative for headache, dizziness, or syncope All  other systems reviewed and are otherwise negative with the exception to those above and in the HPI.   Physical Exam: Blood pressure 138/82, pulse 86, temperature 98.3 F (36.8 C), temperature source Oral, resp. rate 16, height 5\' 7"  (1.702 m), weight 164 lb 12.8 oz (74.753 kg), SpO2 98.00%., Body mass index is 25.81 kg/(m^2). General: Well developed, well nourished, in no acute distress. Head: Normocephalic, atraumatic, eyes with clear ropey discharge bilaterally. Conjunctiva injected. Sclera non-icteric. Posterior eye grams unremarkable. Nares are congested. Bilateral auditory canals clear, TM's are without perforation, pearly grey and translucent with reflective cone of light bilaterally. Oral cavity moist, posterior pharynx without exudate, erythema, peritonsillar abscess, or post nasal drip.  Neck: Supple. No thyromegaly. Full ROM. No lymphadenopathy. Lungs: Clear bilaterally to auscultation without wheezes, rales, or rhonchi. Breathing is unlabored. Heart: RRR with S1 S2. No murmurs, rubs, or gallops appreciated. Msk:  Strength and tone normal for age. Extremities/Skin: Warm and dry. No clubbing or cyanosis. No edema. No rashes or suspicious lesions. Neuro: Alert and oriented X 3. Moves all extremities spontaneously. Gait is normal. CNII-XII grossly in tact. Psych:  Responds to questions appropriately with a normal affect.     ASSESSMENT AND PLAN:  62 y.o. year old female with bronchitis, conjunctivitis, and cough likely initially secondary to adenovirus -Azithromycin 250 MG #6 2 po first day then 1  po next 4 days no RF -Hycodan #4oz 1 tsp po q 4-6 hours prn cough no RF SED -Ocuflox 1-2 gtt both eyes q2-4 hours x 2 days, then 1-2 gtt qid x 5 days #1 no RF -Hand washing -RTC precautions  Signed, Eula Listen, PA-C 03/19/2012 9:27 AM

## 2012-05-29 ENCOUNTER — Other Ambulatory Visit: Payer: Self-pay | Admitting: Obstetrics and Gynecology

## 2012-05-29 ENCOUNTER — Ambulatory Visit (INDEPENDENT_AMBULATORY_CARE_PROVIDER_SITE_OTHER): Payer: 59 | Admitting: Internal Medicine

## 2012-05-29 ENCOUNTER — Encounter: Payer: Self-pay | Admitting: Internal Medicine

## 2012-05-29 VITALS — BP 128/82 | HR 84 | Temp 97.2°F | Resp 16 | Ht 67.0 in | Wt 166.5 lb

## 2012-05-29 DIAGNOSIS — E785 Hyperlipidemia, unspecified: Secondary | ICD-10-CM

## 2012-05-29 DIAGNOSIS — N6489 Other specified disorders of breast: Secondary | ICD-10-CM

## 2012-05-29 DIAGNOSIS — Z23 Encounter for immunization: Secondary | ICD-10-CM

## 2012-05-29 DIAGNOSIS — Z0000 Encounter for general adult medical examination without abnormal findings: Secondary | ICD-10-CM

## 2012-05-29 DIAGNOSIS — R197 Diarrhea, unspecified: Secondary | ICD-10-CM

## 2012-05-29 DIAGNOSIS — M549 Dorsalgia, unspecified: Secondary | ICD-10-CM

## 2012-05-29 DIAGNOSIS — Z1211 Encounter for screening for malignant neoplasm of colon: Secondary | ICD-10-CM

## 2012-05-29 MED ORDER — TETANUS-DIPHTH-ACELL PERTUSSIS 5-2.5-18.5 LF-MCG/0.5 IM SUSP: 0.5000 mL | Freq: Once | INTRAMUSCULAR | Status: DC

## 2012-05-29 MED ORDER — FUROSEMIDE 40 MG PO TABS: 40.0000 mg | ORAL_TABLET | Freq: Every day | ORAL | Status: DC

## 2012-05-29 MED ORDER — BUPROPION HCL ER (XL) 300 MG PO TB24: 300.0000 mg | ORAL_TABLET | Freq: Every day | ORAL | Status: DC

## 2012-05-29 MED ORDER — ATORVASTATIN CALCIUM 20 MG PO TABS: 20.0000 mg | ORAL_TABLET | Freq: Every day | ORAL | Status: DC

## 2012-05-29 NOTE — Assessment & Plan Note (Signed)
On statin - check annually

## 2012-05-29 NOTE — Progress Notes (Signed)
Subjective:    Patient ID: [name], female    DOB: 31-Jul-1951, 62 y.o.   MRN: [id number]  HPI patient is here today for annual physical. Patient feels well and has no complaints.  Past Medical History  Diagnosis Date  . GERD (gastroesophageal reflux disease)   . Hyperlipidemia     on statin  . Depression   . Insomnia   . Seasonal allergies   . Hypertension   . Abnormal uterine bleeding   . Arthritis     knees, hands  . Irritable bowel syndrome     s/p subtotal colectomy '86>diarrhea prone  . NEOPLASM, MALIGNANT, BASAL CELL, CARCINOMA, HX OF   . POSTMENOPAUSAL STATUS     on HRT   Family History  Problem Relation Age of Onset  . Hypertension Mother    History  Substance Use Topics  . Smoking status: Never Smoker   . 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 Alcohol Use: 0.0 oz/week     socially    Review of Systems Constitutional: Negative for fever or weight change.  Respiratory: Negative for cough and shortness of breath.   Cardiovascular: Negative for chest pain or palpitations.  Gastrointestinal: Negative for abdominal pain, no bowel changes.  Musculoskeletal: Negative for gait problem or joint swelling.  Skin: Negative for rash.  Neurological: Negative for dizziness or headache.  No other specific complaints in a complete review of systems (except as listed in HPI above).     Objective:   Physical Exam BP 128/82  Pulse 84  Temp 97.2 F (36.2 C) (Oral)  Resp 16  Ht 5\' 7"  (1.702 m)  Wt 166 lb 8 oz (75.524 kg)  BMI 26.08 kg/m2  SpO2 92% Wt Readings from Last 3 Encounters:  05/29/12 166 lb 8 oz (75.524 kg)  03/19/12 164 lb 12.8 oz (74.753 kg)  08/10/11 170 lb (77.111 kg)   Constitutional: She appears well-developed and well-nourished. No distress.  HENT: Head: Normocephalic and atraumatic. Ears: B TMs ok, no erythema or effusion; Nose: Nose  normal. Mouth/Throat: Oropharynx is clear and moist. No oropharyngeal exudate.  Eyes: Conjunctivae and EOM are normal. Pupils are equal, round, and reactive to light. No scleral icterus.  Neck: Normal range of motion. Neck supple. No JVD present. No thyromegaly present.  Cardiovascular: Normal rate, regular rhythm and normal heart sounds.  No murmur heard. No BLE edema. Pulmonary/Chest: Effort normal and breath sounds normal. No respiratory distress. She has no wheezes.  Abdominal: Soft. Bowel sounds are normal. She exhibits no distension. There is no tenderness. no masses. Well-healed scars without hernia  Musculoskeletal: Normal range of motion, no joint effusions. No gross deformities Neurological: She is alert and oriented to person, place, and time. No cranial nerve deficit. Coordination normal.  Skin: Skin is warm and dry. No rash noted. No erythema.  Psychiatric: She has a normal mood and affect. Her behavior is normal. Judgment and thought content normal.   Lab Results  Component Value Date   WBC 6.8 07/06/2010   HGB 18.4* 01/23/2012   HCT 54.0* 01/23/2012   PLT 237.0 07/06/2010   GLUCOSE 97 01/23/2012   CHOL 147 01/10/2011   TRIG 68.0 01/10/2011   HDL 51.30 01/10/2011   LDLCALC 82 01/10/2011   ALT 23 07/06/2010   AST 27 07/06/2010   NA 138 01/23/2012   K 3.9 01/23/2012   CL  100 01/23/2012   CREATININE 1.00 01/23/2012   BUN 13 01/23/2012   CO2 27 07/06/2010   TSH 1.37 07/06/2010   HGBA1C 5.7 05/31/2009       Assessment & Plan:   CPX/v70.0 - Patient has been counseled on age-appropriate routine health concerns for screening and prevention. These are reviewed and up-to-date. Immunizations are up-to-date or declined. Labs ordered and reviewed.  Also See problem list. Medications and labs reviewed today

## 2012-05-29 NOTE — Assessment & Plan Note (Signed)
Chronic loose stools related to subtotal colectomy in 1986 -  Controlled with medications as needed Refer for followup sigmoidoscopy to screen for colon cancer at this time

## 2012-05-29 NOTE — Patient Instructions (Signed)
It was good to see you today. We have reviewed your prior records including labs and tests today Health Maintenance reviewed - flu shot and tetanus booster given today, referral for sigmoidoscopy to screen for colon cancer  -all other recommended immunizations and age-appropriate screenings are up-to-date. Test(s) ordered today. Return you are fasting. Your results will be called to you or released to my chart after review (48-72hours after test completion). If any changes need to be made, you will be notified at that time. Medications reviewed, no changes at this time. Refill on medication(s) as discussed today. Please schedule followup in 1 year for physical and labs, call sooner if problems.  Health Maintenance, Females A healthy lifestyle and preventative care can promote health and wellness.  Maintain regular health, dental, and eye exams.   Eat a healthy diet. Foods like vegetables, fruits, whole grains, low-fat dairy products, and lean protein foods contain the nutrients you need without too many calories. Decrease your intake of foods high in solid fats, added sugars, and salt. Get information about a proper diet from your caregiver, if necessary.   Regular physical exercise is one of the most important things you can do for your health. Most adults should get at least 150 minutes of moderate-intensity exercise (any activity that increases your heart rate and causes you to sweat) each week. In addition, most adults need muscle-strengthening exercises on 2 or more days a week.     Maintain a healthy weight. The body mass index (BMI) is a screening tool to identify possible weight problems. It provides an estimate of body fat based on height and weight. Your caregiver can help determine your BMI, and can help you achieve or maintain a healthy weight. For adults 20 years and older:   A BMI below 18.5 is considered underweight.   A BMI of 18.5 to 24.9 is normal.   A BMI of 25 to 29.9 is  considered overweight.   A BMI of 30 and above is considered obese.   Maintain normal blood lipids and cholesterol by exercising and minimizing your intake of saturated fat. Eat a balanced diet with plenty of fruits and vegetables. Blood tests for lipids and cholesterol should begin at age 5 and be repeated every 5 years. If your lipid or cholesterol levels are high, you are over 50, or you are a high risk for heart disease, you may need your cholesterol levels checked more frequently. Ongoing high lipid and cholesterol levels should be treated with medicines if diet and exercise are not effective.   If you smoke, find out from your caregiver how to quit. If you do not use tobacco, do not start.   If you are pregnant, do not drink alcohol. If you are breastfeeding, be very cautious about drinking alcohol. If you are not pregnant and choose to drink alcohol, do not exceed 1 drink per day. One drink is considered to be 12 ounces (355 mL) of beer, 5 ounces (148 mL) of wine, or 1.5 ounces (44 mL) of liquor.   Avoid use of street drugs. Do not share needles with anyone. Ask for help if you need support or instructions about stopping the use of drugs.   High blood pressure causes heart disease and increases the risk of stroke. Blood pressure should be checked at least every 1 to 2 years. Ongoing high blood pressure should be treated with medicines, if weight loss and exercise are not effective.   If you are 55 to 62 years  old, ask your caregiver if you should take aspirin to prevent strokes.   Diabetes screening involves taking a blood sample to check your fasting blood sugar level. This should be done once every 3 years, after age 82, if you are within normal weight and without risk factors for diabetes. Testing should be considered at a younger age or be carried out more frequently if you are overweight and have at least 1 risk factor for diabetes.   Breast cancer screening is essential preventative  care for women. You should practice "breast self-awareness." This means understanding the normal appearance and feel of your breasts and may include breast self-examination. Any changes detected, no matter how small, should be reported to a caregiver. Women in their 34s and 30s should have a clinical breast exam (CBE) by a caregiver as part of a regular health exam every 1 to 3 years. After age 9, women should have a CBE every year. Starting at age 28, women should consider having a mammogram (breast X-ray) every year. Women who have a family history of breast cancer should talk to their caregiver about genetic screening. Women at a high risk of breast cancer should talk to their caregiver about having an MRI and a mammogram every year.   The Pap test is a screening test for cervical cancer. Women should have a Pap test starting at age 67. Between ages 70 and 83, Pap tests should be repeated every 2 years. Beginning at age 58, you should have a Pap test every 3 years as long as the past 3 Pap tests have been normal. If you had a hysterectomy for a problem that was not cancer or a condition that could lead to cancer, then you no longer need Pap tests. If you are between ages 7 and 81, and you have had normal Pap tests going back 10 years, you no longer need Pap tests. If you have had past treatment for cervical cancer or a condition that could lead to cancer, you need Pap tests and screening for cancer for at least 20 years after your treatment. If Pap tests have been discontinued, risk factors (such as a new sexual partner) need to be reassessed to determine if screening should be resumed. Some women have medical problems that increase the chance of getting cervical cancer. In these cases, your caregiver may recommend more frequent screening and Pap tests.   The human papillomavirus (HPV) test is an additional test that may be used for cervical cancer screening. The HPV test looks for the virus that can cause  the cell changes on the cervix. The cells collected during the Pap test can be tested for HPV. The HPV test could be used to screen women aged 39 years and older, and should be used in women of any age who have unclear Pap test results. After the age of 58, women should have HPV testing at the same frequency as a Pap test.   Colorectal cancer can be detected and often prevented. Most routine colorectal cancer screening begins at the age of 69 and continues through age 69. However, your caregiver may recommend screening at an earlier age if you have risk factors for colon cancer. On a yearly basis, your caregiver may provide home test kits to check for hidden blood in the stool. Use of a small camera at the end of a tube, to directly examine the colon (sigmoidoscopy or colonoscopy), can detect the earliest forms of colorectal cancer. Talk to your caregiver about  this at age 57, when routine screening begins. Direct examination of the colon should be repeated every 5 to 10 years through age 40, unless early forms of pre-cancerous polyps or small growths are found.   Hepatitis C blood testing is recommended for all people born from [birth year] through 1966 and any individual with known risks for hepatitis C.   Practice safe sex. Use condoms and avoid high-risk sexual practices to reduce the spread of sexually transmitted infections (STIs). Sexually active women aged 61 and younger should be checked for Chlamydia, which is a common sexually transmitted infection. Older women with new or multiple partners should also be tested for Chlamydia. Testing for other STIs is recommended if you are sexually active and at increased risk.   Osteoporosis is a disease in which the bones lose minerals and strength with aging. This can result in serious bone fractures. The risk of osteoporosis can be identified using a bone density scan. Women ages 59 and over and women at risk for fractures or osteoporosis should discuss screening  with their caregivers. Ask your caregiver whether you should be taking a calcium supplement or vitamin D to reduce the rate of osteoporosis.   Menopause can be associated with physical symptoms and risks. Hormone replacement therapy is available to decrease symptoms and risks. You should talk to your caregiver about whether hormone replacement therapy is right for you.   Use sunscreen with a sun protection factor (SPF) of 30 or greater. Apply sunscreen liberally and repeatedly throughout the day. You should seek shade when your shadow is shorter than you. Protect yourself by wearing long sleeves, pants, a wide-brimmed hat, and sunglasses year round, whenever you are outdoors.   Notify your caregiver of new moles or changes in moles, especially if there is a change in shape or color. Also notify your caregiver if a mole is larger than the size of a pencil eraser.   Stay current with your immunizations.  Document Released: 03/12/2011 Document Revised: 08/16/2011 Document Reviewed: 03/12/2011 [facility] Patient Information 2012 [place], [place].

## 2012-05-29 NOTE — Assessment & Plan Note (Signed)
History of prior lumbar decompression 1999 with residual pain symptoms Uses hydrocodone as needed and twice monthly therapeutic back and body massage [facility]) Currently symptoms stable, continue medications as ongoing

## 2012-05-30 ENCOUNTER — Encounter: Payer: Self-pay | Admitting: Gastroenterology

## 2012-06-04 ENCOUNTER — Other Ambulatory Visit (INDEPENDENT_AMBULATORY_CARE_PROVIDER_SITE_OTHER): Payer: 59

## 2012-06-04 DIAGNOSIS — Z0000 Encounter for general adult medical examination without abnormal findings: Secondary | ICD-10-CM

## 2012-06-04 LAB — URINALYSIS, ROUTINE W REFLEX MICROSCOPIC
Bilirubin Urine: NEGATIVE
Total Protein, Urine: NEGATIVE
Urine Glucose: NEGATIVE
pH: 6 (ref 5.0–8.0)

## 2012-06-04 LAB — HEPATIC FUNCTION PANEL
Albumin: 4.1 g/dL (ref 3.5–5.2)
Bilirubin, Direct: 0.1 mg/dL (ref 0.0–0.3)
Total Protein: 6.6 g/dL (ref 6.0–8.3)

## 2012-06-04 LAB — CBC WITH DIFFERENTIAL/PLATELET
Basophils Relative: 1.4 % (ref 0.0–3.0)
Eosinophils Relative: 5 % (ref 0.0–5.0)
HCT: 43 % (ref 36.0–46.0)
Hemoglobin: 13.9 g/dL (ref 12.0–15.0)
Lymphs Abs: 1.7 10*3/uL (ref 0.7–4.0)
MCV: 93.3 fl (ref 78.0–100.0)
Monocytes Absolute: 0.6 10*3/uL (ref 0.1–1.0)
Monocytes Relative: 9.9 % (ref 3.0–12.0)
Neutro Abs: 3.7 10*3/uL (ref 1.4–7.7)
RBC: 4.61 Mil/uL (ref 3.87–5.11)
WBC: 6.5 10*3/uL (ref 4.5–10.5)

## 2012-06-04 LAB — BASIC METABOLIC PANEL
BUN: 19 mg/dL (ref 6–23)
Chloride: 108 mEq/L (ref 96–112)
Potassium: 5.4 mEq/L — ABNORMAL HIGH (ref 3.5–5.1)
Sodium: 145 mEq/L (ref 135–145)

## 2012-06-04 LAB — LIPID PANEL
Cholesterol: 155 mg/dL (ref 0–200)
HDL: 52.8 mg/dL (ref >39.00)
LDL Cholesterol: 80 mg/dL (ref 0–99)
VLDL: 22 mg/dL (ref 0.0–40.0)

## 2012-06-05 ENCOUNTER — Telehealth: Payer: Self-pay | Admitting: Gastroenterology

## 2012-06-06 ENCOUNTER — Encounter: Payer: Self-pay | Admitting: *Deleted

## 2012-06-06 NOTE — Telephone Encounter (Signed)
lmom for pt to call back

## 2012-06-06 NOTE — Telephone Encounter (Signed)
lmom for pt to call back. She's had a subtotal Colectomy and only her sigmoid to rectal area remains.

## 2012-06-06 NOTE — Telephone Encounter (Signed)
Informed pt she is correct and will only need a Flex Sig. She is scheduled for a PV and COLON in November; mailed pt her appts.

## 2012-06-06 NOTE — Telephone Encounter (Signed)
Opened in error

## 2012-07-23 ENCOUNTER — Ambulatory Visit
Admission: RE | Admit: 2012-07-23 | Discharge: 2012-07-23 | Disposition: A | Discharge status: Home / Self Care | Payer: 59 | Source: Ambulatory Visit | Attending: Obstetrics and Gynecology | Admitting: Obstetrics and Gynecology

## 2012-07-23 DIAGNOSIS — N6489 Other specified disorders of breast: Secondary | ICD-10-CM

## 2012-07-28 ENCOUNTER — Other Ambulatory Visit: Payer: 59 | Admitting: Gastroenterology

## 2012-07-28 ENCOUNTER — Ambulatory Visit: Payer: 59 | Admitting: *Deleted

## 2012-07-28 ENCOUNTER — Encounter: Payer: Self-pay | Admitting: Gastroenterology

## 2012-07-28 VITALS — Ht 67.0 in | Wt 172.6 lb

## 2012-07-28 DIAGNOSIS — Z1211 Encounter for screening for malignant neoplasm of colon: Secondary | ICD-10-CM

## 2012-08-01 ENCOUNTER — Ambulatory Visit (AMBULATORY_SURGERY_CENTER): Payer: 59 | Admitting: Gastroenterology

## 2012-08-01 ENCOUNTER — Encounter: Payer: Self-pay | Admitting: Gastroenterology

## 2012-08-01 VITALS — BP 115/75 | HR 75 | Temp 98.5°F | Resp 21 | Ht 67.0 in | Wt 172.0 lb

## 2012-08-01 DIAGNOSIS — Z9889 Other specified postprocedural states: Secondary | ICD-10-CM

## 2012-08-01 DIAGNOSIS — Z9049 Acquired absence of other specified parts of digestive tract: Secondary | ICD-10-CM

## 2012-08-01 DIAGNOSIS — Z1211 Encounter for screening for malignant neoplasm of colon: Secondary | ICD-10-CM

## 2012-08-01 MED ORDER — SODIUM CHLORIDE 0.9 % IV SOLN: 500.0000 mL | INTRAVENOUS | Status: DC

## 2012-08-01 NOTE — Op Note (Signed)
[facility] [address] [place], [address]   FLEXIBLE SIGMOIDOSCOPY PROCEDURE REPORT  PATIENT: Jessica, Vang  MR#: [id number] BIRTHDATE: 03-06-1951 , 61  yrs. old GENDER: Female ENDOSCOPIST: Mardella Layman, MD, [facility] REFERRED BY: PROCEDURE DATE:  08/01/2012 PROCEDURE:   Sigmoidoscopy, screening ASA CLASS:   Class II INDICATIONS:average risk patient for colorectal cancer. ,hx. of subtotal colectomy... MEDICATIONS: Propofol (Diprivan) 120 mg IV  DESCRIPTION OF PROCEDURE:   After the risks benefits and alternatives of the procedure were thoroughly explained, informed consent was obtained.  revealed no abnormalities of the rectum. The [id number]  endoscope was introduced through the anus and advanced to the surgical anastomosis , limited by No adverse events experienced.   The quality of the prep was adequate .  The instrument was then slowly withdrawn as the mucosa was fully examined.       COLON FINDINGS: The colonoscope was introduced into the rectum without difficulty.  subtotal colectomy with small bowel mucosa noted up to 50 cm.There were no mucosal or polypoid lesions.  She tolerated this procedure well. Retroflexed views revealed no abnormalities.    The scope was then withdrawn from the patient and the procedure terminated.  COMPLICATIONS: There were no complications.  ENDOSCOPIC IMPRESSION: The colonoscope was introduced into the rectum without difficulty. subtotal colectomy with  small bowel mucosa noted up to 50 cm.There were no mucosal o rpolypoid lesions.  She tolerated this procedure well.Marland Kitchen  RECOMMENDATIONS: continue current meds,f/u prn   REPEAT EXAM:   _______________________________ eSignedMardella Layman, MD, [facility] 08/01/2012 1:38 PM   CC:

## 2012-08-01 NOTE — Progress Notes (Signed)
Patient did not have preoperative order for IV antibiotic SSI prophylaxis. (G8918)  Patient did not experience any of the following events: a burn prior to discharge; a fall within the facility; wrong site/side/patient/procedure/implant event; or a hospital transfer or hospital admission upon discharge from the facility. (G8907)  

## 2012-08-01 NOTE — Patient Instructions (Addendum)
YOU HAD AN ENDOSCOPIC PROCEDURE TODAY AT THE [facility]: Refer to the procedure report that was given to you for any specific questions about what was found during the examination.  If the procedure report does not answer your questions, please call your gastroenterologist to clarify.  If you requested that your care partner not be given the details of your procedure findings, then the procedure report has been included in a sealed envelope for you to review at your convenience later.  YOU SHOULD EXPECT: Some feelings of bloating in the abdomen. Passage of more gas than usual.  Walking can help get rid of the air that was put into your GI tract during the procedure and reduce the bloating. If you had a lower endoscopy (such as a colonoscopy or flexible sigmoidoscopy) you may notice spotting of blood in your stool or on the toilet paper. If you underwent a bowel prep for your procedure, then you may not have a normal bowel movement for a few days.  DIET: Your first meal following the procedure should be a light meal and then it is ok to progress to your normal diet.  A half-sandwich or bowl of soup is an example of a good first meal.  Heavy or fried foods are harder to digest and may make you feel nauseous or bloated.  Likewise meals heavy in dairy and vegetables can cause extra gas to form and this can also increase the bloating.  Drink plenty of fluids but you should avoid alcoholic beverages for 24 hours.  ACTIVITY: Your care partner should take you home directly after the procedure.  You should plan to take it easy, moving slowly for the rest of the day.  You can resume normal activity the day after the procedure however you should NOT DRIVE or use heavy machinery for 24 hours (because of the sedation medicines used during the test).    SYMPTOMS TO REPORT IMMEDIATELY: A gastroenterologist can be reached at any hour.  During normal business hours, 8:30 AM to 5:00 PM Monday through Friday,  call [phone].  After hours and on weekends, please call the GI answering service at [phone] who will take a message and have the physician on call contact you.   Following lower endoscopy (colonoscopy or flexible sigmoidoscopy):  Excessive amounts of blood in the stool  Significant tenderness or worsening of abdominal pains  Swelling of the abdomen that is new, acute  Fever of 100F or higher    FOLLOW UP: If any biopsies were taken you will be contacted by phone or by letter within the next 1-3 weeks.  Call your gastroenterologist if you have not heard about the biopsies in 3 weeks.  Our staff will call the home number listed on your records the next business day following your procedure to check on you and address any questions or concerns that you may have at that time regarding the information given to you following your procedure. This is a courtesy call and so if there is no answer at the home number and we have not heard from you through the emergency physician on call, we will assume that you have returned to your regular daily activities without incident.  SIGNATURES/CONFIDENTIALITY: You and/or your care partner have signed paperwork which will be entered into your electronic medical record.  These signatures attest to the fact that that the information above on your After Visit Summary has been reviewed and is understood.  Full responsibility of the confidentiality   of this discharge information lies with you and/or your care-partner.     

## 2012-08-01 NOTE — Progress Notes (Signed)
Patient did not have preoperative order for IV antibiotic SSI prophylaxis. (G8918)Patient did not have preoperative order for IV antibiotic SSI prophylaxis. (G8918)Patient did not have preoperative order for IV antibiotic SSI prophylaxis. (G8918)Patient did not experience any of the following events: a burn prior to discharge; a fall within the facility; wrong site/side/patient/procedure/implant event; or a hospital transfer or hospital admission upon discharge from the facility. (G8907) 

## 2012-08-04 ENCOUNTER — Telehealth: Payer: Self-pay | Admitting: *Deleted

## 2012-08-04 NOTE — Telephone Encounter (Signed)
  Follow up Call-  Call back number 08/01/2012  Post procedure Call Back phone  # [phone]  Permission to leave phone message Yes     Patient questions:  Do you have a fever, pain , or abdominal swelling? no Pain Score  0 *  Have you tolerated food without any problems? yes  Have you been able to return to your normal activities? yes  Do you have any questions about your discharge instructions: Diet   no Medications  no Follow up visit  no  Do you have questions or concerns about your Care? no  Actions: * If pain score is 4 or above: No action needed, pain <4.

## 2013-05-19 ENCOUNTER — Other Ambulatory Visit: Payer: Self-pay | Admitting: Internal Medicine

## 2013-05-21 ENCOUNTER — Other Ambulatory Visit: Payer: Self-pay

## 2013-05-21 DIAGNOSIS — Z9889 Other specified postprocedural states: Secondary | ICD-10-CM

## 2013-05-21 DIAGNOSIS — Z1231 Encounter for screening mammogram for malignant neoplasm of breast: Secondary | ICD-10-CM

## 2013-06-29 ENCOUNTER — Other Ambulatory Visit: Payer: Self-pay | Admitting: Internal Medicine

## 2013-07-16 ENCOUNTER — Other Ambulatory Visit: Payer: Self-pay

## 2013-07-24 ENCOUNTER — Ambulatory Visit
Admission: RE | Admit: 2013-07-24 | Discharge: 2013-07-24 | Disposition: A | Discharge status: Home / Self Care | Payer: 59 | Source: Ambulatory Visit

## 2013-07-24 DIAGNOSIS — Z1231 Encounter for screening mammogram for malignant neoplasm of breast: Secondary | ICD-10-CM

## 2013-07-24 DIAGNOSIS — Z9889 Other specified postprocedural states: Secondary | ICD-10-CM

## 2013-07-26 ENCOUNTER — Other Ambulatory Visit: Payer: Self-pay | Admitting: Internal Medicine

## 2013-07-27 ENCOUNTER — Other Ambulatory Visit: Payer: Self-pay | Admitting: Obstetrics and Gynecology

## 2013-07-27 DIAGNOSIS — R928 Other abnormal and inconclusive findings on diagnostic imaging of breast: Secondary | ICD-10-CM

## 2013-08-03 ENCOUNTER — Encounter: Payer: Self-pay | Admitting: Internal Medicine

## 2013-08-03 ENCOUNTER — Ambulatory Visit (INDEPENDENT_AMBULATORY_CARE_PROVIDER_SITE_OTHER): Payer: 59 | Admitting: Internal Medicine

## 2013-08-03 ENCOUNTER — Other Ambulatory Visit (INDEPENDENT_AMBULATORY_CARE_PROVIDER_SITE_OTHER): Payer: 59

## 2013-08-03 VITALS — BP 130/82 | HR 79 | Temp 97.8°F | Ht 66.5 in | Wt 166.8 lb

## 2013-08-03 DIAGNOSIS — Z0000 Encounter for general adult medical examination without abnormal findings: Secondary | ICD-10-CM

## 2013-08-03 DIAGNOSIS — Z23 Encounter for immunization: Secondary | ICD-10-CM

## 2013-08-03 DIAGNOSIS — E785 Hyperlipidemia, unspecified: Secondary | ICD-10-CM

## 2013-08-03 DIAGNOSIS — I10 Essential (primary) hypertension: Secondary | ICD-10-CM

## 2013-08-03 DIAGNOSIS — N951 Menopausal and female climacteric states: Secondary | ICD-10-CM

## 2013-08-03 DIAGNOSIS — Z2911 Encounter for prophylactic immunotherapy for respiratory syncytial virus (RSV): Secondary | ICD-10-CM

## 2013-08-03 LAB — HEPATIC FUNCTION PANEL
ALT: 19 U/L (ref 0–35)
Bilirubin, Direct: 0.1 mg/dL (ref 0.0–0.3)
Total Bilirubin: 0.8 mg/dL (ref 0.3–1.2)

## 2013-08-03 LAB — CBC WITH DIFFERENTIAL/PLATELET
Basophils Absolute: 0.1 10*3/uL (ref 0.0–0.1)
Eosinophils Relative: 1.9 % (ref 0.0–5.0)
HCT: 46.9 % — ABNORMAL HIGH (ref 36.0–46.0)
Hemoglobin: 15.5 g/dL — ABNORMAL HIGH (ref 12.0–15.0)
Lymphs Abs: 2.3 10*3/uL (ref 0.7–4.0)
MCV: 91 fl (ref 78.0–100.0)
Monocytes Absolute: 0.7 10*3/uL (ref 0.1–1.0)
Monocytes Relative: 6.4 % (ref 3.0–12.0)
Neutro Abs: 7 10*3/uL (ref 1.4–7.7)
Platelets: 240 10*3/uL (ref 150.0–400.0)
RDW: 13 % (ref 11.5–14.6)

## 2013-08-03 LAB — BASIC METABOLIC PANEL
BUN: 15 mg/dL (ref 6–23)
Calcium: 9.9 mg/dL (ref 8.4–10.5)
Chloride: 98 mEq/L (ref 96–112)
GFR: 55.62 mL/min — ABNORMAL LOW (ref >60.00)
Glucose, Bld: 88 mg/dL (ref 70–99)
Potassium: 3.7 mEq/L (ref 3.5–5.1)
Sodium: 139 mEq/L (ref 135–145)

## 2013-08-03 LAB — LIPID PANEL
HDL: 60.5 mg/dL (ref >39.00)
LDL Cholesterol: 90 mg/dL (ref 0–99)
Total CHOL/HDL Ratio: 3
VLDL: 26.4 mg/dL (ref 0.0–40.0)

## 2013-08-03 LAB — URINALYSIS, ROUTINE W REFLEX MICROSCOPIC
Bilirubin Urine: NEGATIVE
Ketones, ur: NEGATIVE
Total Protein, Urine: NEGATIVE
pH: 6 (ref 5.0–8.0)

## 2013-08-03 LAB — TSH: TSH: 1.81 u[IU]/mL (ref 0.35–5.50)

## 2013-08-03 MED ORDER — FUROSEMIDE 40 MG PO TABS: 40.0000 mg | ORAL_TABLET | Freq: Every day | ORAL | Status: DC

## 2013-08-03 MED ORDER — ATORVASTATIN CALCIUM 20 MG PO TABS: 20.0000 mg | ORAL_TABLET | Freq: Every day | ORAL | Status: DC

## 2013-08-03 MED ORDER — BUPROPION HCL ER (XL) 300 MG PO TB24: 300.0000 mg | ORAL_TABLET | Freq: Every day | ORAL | Status: DC

## 2013-08-03 NOTE — Progress Notes (Signed)
Subjective:    Patient ID: [name], female    DOB: 09-02-1951, 62 y.o.   MRN: [id number]  HPI patient is here today for annual physical. Patient feels well and has no complaints.  Reviewed chronic medical issues and interval medical events  Past Medical History  Diagnosis Date  . GERD (gastroesophageal reflux disease)   . Hyperlipidemia     on statin  . Depression   . Insomnia   . Seasonal allergies   . Hypertension   . Abnormal uterine bleeding   . Arthritis     knees, hands  . Irritable bowel syndrome     s/p subtotal colectomy '86>diarrhea prone  . NEOPLASM, MALIGNANT, BASAL CELL, CARCINOMA, HX OF   . POSTMENOPAUSAL STATUS     on HRT   Family History  Problem Relation Age of Onset  . Hypertension Mother   . Stomach cancer Father 67  . Colon cancer Neg Hx    History  Substance Use Topics  . Smoking status: Never Smoker   . 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 Alcohol Use: 0.0 oz/week     Comment: occasional/ 2 drinks a month    Review of Systems Constitutional: Negative for fever or weight change.  Respiratory: Negative for cough and shortness of breath.   Cardiovascular: Negative for chest pain or palpitations.  Gastrointestinal: Negative for abdominal pain, no bowel changes.  Musculoskeletal: Negative for gait problem or joint swelling.  Skin: Negative for rash.  Neurological: Negative for dizziness or headache.  No other specific complaints in a complete review of systems (except as listed in HPI above).     Objective:   Physical Exam BP 130/82  Pulse 79  Temp(Src) 97.8 F (36.6 C) (Oral)  Ht 5' 6.5" (1.689 m)  Wt 166 lb 12.8 oz (75.66 kg)  BMI 26.52 kg/m2  SpO2 97% Wt Readings from Last 3 Encounters:  08/03/13 166 lb 12.8 oz (75.66 kg)  08/01/12 172 lb (78.019 kg)  07/28/12 172 lb 9.6 oz (78.291 kg)   Constitutional: She appears  well-developed and well-nourished. No distress.  HENT: Head: Normocephalic and atraumatic. Ears: B TMs ok, no erythema or effusion; Nose: Nose normal. Mouth/Throat: Oropharynx is clear and moist. No oropharyngeal exudate.  Eyes: Conjunctivae and EOM are normal. Pupils are equal, round, and reactive to light. No scleral icterus.  Neck: Normal range of motion. Neck supple. No JVD present. No thyromegaly present.  Cardiovascular: Normal rate, regular rhythm and normal heart sounds.  No murmur heard. No BLE edema. Pulmonary/Chest: Effort normal and breath sounds normal. No respiratory distress. She has no wheezes.  Abdominal: Soft. Bowel sounds are normal. She exhibits no distension. There is no tenderness. no masses. Well-healed scars without hernia  Musculoskeletal: Normal range of motion, no joint effusions. No gross deformities Neurological: She is alert and oriented to person, place, and time. No cranial nerve deficit. Coordination normal.  Skin: Few AKs chest and BUE - Skin is warm and dry. No rash noted. No erythema.  Psychiatric: She has a normal mood and affect. Her behavior is normal. Judgment and thought content normal.   Lab Results  Component Value Date   WBC 6.5 06/04/2012   HGB 13.9 06/04/2012   HCT 43.0 06/04/2012   PLT 210.0 06/04/2012   GLUCOSE 99 06/04/2012   CHOL 155 06/04/2012   TRIG 110.0 06/04/2012  HDL 52.80 06/04/2012   LDLCALC 80 06/04/2012   ALT 18 06/04/2012   AST 18 06/04/2012   NA 145 06/04/2012   K 5.4* 06/04/2012   CL 108 06/04/2012   CREATININE 0.9 06/04/2012   BUN 19 06/04/2012   CO2 28 06/04/2012   TSH 1.87 06/04/2012   HGBA1C 5.7 05/31/2009   ECG: sinus @ 75 bpm - no ischemic change or arrythmia     Assessment & Plan:   CPX/v70.0 - Patient has been counseled on age-appropriate routine health concerns for screening and prevention. These are reviewed and up-to-date. Immunizations are up-to-date or declined. Labs ordered and reviewed.  Also See problem list.  Medications and labs reviewed today

## 2013-08-03 NOTE — Assessment & Plan Note (Signed)
BP Readings from Last 3 Encounters:  08/03/13 130/82  08/01/12 115/75  05/29/12 128/82   The current medical regimen is effective;  continue present plan and medications.

## 2013-08-03 NOTE — Progress Notes (Signed)
Pre-visit discussion using our clinic review tool. No additional management support is needed unless otherwise documented below in the visit note.  

## 2013-08-03 NOTE — Assessment & Plan Note (Signed)
Follows with gynecology for same regularly On hormone replacement therapy, discussed risk-benefit of same Encourage consideration of weaning future date as able -defer to gynecology as he is managing same

## 2013-08-03 NOTE — Assessment & Plan Note (Signed)
On statin - check annually The current medical regimen is effective;  continue present plan and medications.  

## 2013-08-03 NOTE — Patient Instructions (Addendum)
It was good to see you today.  We have reviewed your prior records including labs and tests today  Health Maintenance reviewed - all recommended immunizations and age-appropriate screenings are up-to-date.  Test(s) ordered today. Your results will be released to MyChart (or called to you) after review, usually within 72hours after test completion. If any changes need to be made, you will be notified at that same time.  Medications reviewed and updated, no changes recommended at this time. Refill on medication(s) as discussed today.  Please schedule followup in 1 year for physical and labs, call sooner if problems.  Health Maintenance, Female A healthy lifestyle and preventative care can promote health and wellness.  Maintain regular health, dental, and eye exams.  Eat a healthy diet. Foods like vegetables, fruits, whole grains, low-fat dairy products, and lean protein foods contain the nutrients you need without too many calories. Decrease your intake of foods high in solid fats, added sugars, and salt. Get information about a proper diet from your caregiver, if necessary.  Regular physical exercise is one of the most important things you can do for your health. Most adults should get at least 150 minutes of moderate-intensity exercise (any activity that increases your heart rate and causes you to sweat) each week. In addition, most adults need muscle-strengthening exercises on 2 or more days a week.   Maintain a healthy weight. The body mass index (BMI) is a screening tool to identify possible weight problems. It provides an estimate of body fat based on height and weight. Your caregiver can help determine your BMI, and can help you achieve or maintain a healthy weight. For adults 20 years and older:  A BMI below 18.5 is considered underweight.  A BMI of 18.5 to 24.9 is normal.  A BMI of 25 to 29.9 is considered overweight.  A BMI of 30 and above is considered obese.  Maintain  normal blood lipids and cholesterol by exercising and minimizing your intake of saturated fat. Eat a balanced diet with plenty of fruits and vegetables. Blood tests for lipids and cholesterol should begin at age 20 and be repeated every 5 years. If your lipid or cholesterol levels are high, you are over 50, or you are a high risk for heart disease, you may need your cholesterol levels checked more frequently.Ongoing high lipid and cholesterol levels should be treated with medicines if diet and exercise are not effective.  If you smoke, find out from your caregiver how to quit. If you do not use tobacco, do not start.  Lung cancer screening is recommended for adults aged 62 80 years who are at high risk for developing lung cancer because of a history of smoking. Yearly low-dose computed tomography (CT) is recommended for people who have at least a 30-pack-year history of smoking and are a current smoker or have quit within the past 15 years. A pack year of smoking is smoking an average of 1 pack of cigarettes a day for 1 year (for example: 1 pack a day for 30 years or 2 packs a day for 15 years). Yearly screening should continue until the smoker has stopped smoking for at least 15 years. Yearly screening should also be stopped for people who develop a health problem that would prevent them from having lung cancer treatment.  If you are pregnant, do not drink alcohol. If you are breastfeeding, be very cautious about drinking alcohol. If you are not pregnant and choose to drink alcohol, do not exceed 1  drink per day. One drink is considered to be 12 ounces (355 mL) of beer, 5 ounces (148 mL) of wine, or 1.5 ounces (44 mL) of liquor.  Avoid use of street drugs. Do not share needles with anyone. Ask for help if you need support or instructions about stopping the use of drugs.  High blood pressure causes heart disease and increases the risk of stroke. Blood pressure should be checked at least every 1 to 2  years. Ongoing high blood pressure should be treated with medicines, if weight loss and exercise are not effective.  If you are 55 to 63 years old, ask your caregiver if you should take aspirin to prevent strokes.  Diabetes screening involves taking a blood sample to check your fasting blood sugar level. This should be done once every 3 years, after age 45, if you are within normal weight and without risk factors for diabetes. Testing should be considered at a younger age or be carried out more frequently if you are overweight and have at least 1 risk factor for diabetes.  Breast cancer screening is essential preventative care for women. You should practice "breast self-awareness." This means understanding the normal appearance and feel of your breasts and may include breast self-examination. Any changes detected, no matter how small, should be reported to a caregiver. Women in their 20s and 30s should have a clinical breast exam (CBE) by a caregiver as part of a regular health exam every 1 to 3 years. After age 40, women should have a CBE every year. Starting at age 40, women should consider having a mammogram (breast X-ray) every year. Women who have a family history of breast cancer should talk to their caregiver about genetic screening. Women at a high risk of breast cancer should talk to their caregiver about having an MRI and a mammogram every year.  Breast cancer gene (BRCA)-related cancer risk assessment is recommended for women who have family members with BRCA-related cancers. BRCA-related cancers include breast, ovarian, tubal, and peritoneal cancers. Having family members with these cancers may be associated with an increased risk for harmful changes (mutations) in the breast cancer genes BRCA1 and BRCA2. Results of the assessment will determine the need for genetic counseling and BRCA1 and BRCA2 testing.  The Pap test is a screening test for cervical cancer. Women should have a Pap test  starting at age 21. Between ages 21 and 29, Pap tests should be repeated every 2 years. Beginning at age 30, you should have a Pap test every 3 years as long as the past 3 Pap tests have been normal. If you had a hysterectomy for a problem that was not cancer or a condition that could lead to cancer, then you no longer need Pap tests. If you are between ages 65 and 70, and you have had normal Pap tests going back 10 years, you no longer need Pap tests. If you have had past treatment for cervical cancer or a condition that could lead to cancer, you need Pap tests and screening for cancer for at least 20 years after your treatment. If Pap tests have been discontinued, risk factors (such as a new sexual partner) need to be reassessed to determine if screening should be resumed. Some women have medical problems that increase the chance of getting cervical cancer. In these cases, your caregiver may recommend more frequent screening and Pap tests.  The human papillomavirus (HPV) test is an additional test that may be used for cervical cancer screening.   The HPV test looks for the virus that can cause the cell changes on the cervix. The cells collected during the Pap test can be tested for HPV. The HPV test could be used to screen women aged 10 years and older, and should be used in women of any age who have unclear Pap test results. After the age of 22, women should have HPV testing at the same frequency as a Pap test.  Colorectal cancer can be detected and often prevented. Most routine colorectal cancer screening begins at the age of 40 and continues through age 41. However, your caregiver may recommend screening at an earlier age if you have risk factors for colon cancer. On a yearly basis, your caregiver may provide home test kits to check for hidden blood in the stool. Use of a small camera at the end of a tube, to directly examine the colon (sigmoidoscopy or colonoscopy), can detect the earliest forms of  colorectal cancer. Talk to your caregiver about this at age 2, when routine screening begins. Direct examination of the colon should be repeated every 5 to 10 years through age 23, unless early forms of pre-cancerous polyps or small growths are found.  Hepatitis C blood testing is recommended for all people born from [birth year] through 1966 and any individual with known risks for hepatitis C.  Practice safe sex. Use condoms and avoid high-risk sexual practices to reduce the spread of sexually transmitted infections (STIs). Sexually active women aged 56 and younger should be checked for Chlamydia, which is a common sexually transmitted infection. Older women with new or multiple partners should also be tested for Chlamydia. Testing for other STIs is recommended if you are sexually active and at increased risk.  Osteoporosis is a disease in which the bones lose minerals and strength with aging. This can result in serious bone fractures. The risk of osteoporosis can be identified using a bone density scan. Women ages 55 and over and women at risk for fractures or osteoporosis should discuss screening with their caregivers. Ask your caregiver whether you should be taking a calcium supplement or vitamin D to reduce the rate of osteoporosis.  Menopause can be associated with physical symptoms and risks. Hormone replacement therapy is available to decrease symptoms and risks. You should talk to your caregiver about whether hormone replacement therapy is right for you.  Use sunscreen. Apply sunscreen liberally and repeatedly throughout the day. You should seek shade when your shadow is shorter than you. Protect yourself by wearing long sleeves, pants, a wide-brimmed hat, and sunglasses year round, whenever you are outdoors.  Notify your caregiver of new moles or changes in moles, especially if there is a change in shape or color. Also notify your caregiver if a mole is larger than the size of a pencil eraser.  Stay  current with your immunizations. Document Released: 03/12/2011 Document Revised: 12/22/2012 Document Reviewed: 03/12/2011 [facility] Patient Information 2014 [place], [place].

## 2013-08-04 ENCOUNTER — Encounter: Payer: Self-pay | Admitting: Internal Medicine

## 2013-08-05 MED ORDER — ATORVASTATIN CALCIUM 20 MG PO TABS: 20.0000 mg | ORAL_TABLET | Freq: Every day | ORAL | Status: DC

## 2013-08-05 MED ORDER — BUPROPION HCL ER (XL) 300 MG PO TB24: 300.0000 mg | ORAL_TABLET | Freq: Every day | ORAL | Status: DC

## 2013-08-05 MED ORDER — FUROSEMIDE 40 MG PO TABS: 40.0000 mg | ORAL_TABLET | Freq: Every day | ORAL | Status: DC

## 2013-08-13 ENCOUNTER — Ambulatory Visit
Admission: RE | Admit: 2013-08-13 | Discharge: 2013-08-13 | Disposition: A | Discharge status: Home / Self Care | Payer: 59 | Source: Ambulatory Visit | Attending: Obstetrics and Gynecology | Admitting: Obstetrics and Gynecology

## 2013-08-13 DIAGNOSIS — R928 Other abnormal and inconclusive findings on diagnostic imaging of breast: Secondary | ICD-10-CM

## 2014-05-19 ENCOUNTER — Other Ambulatory Visit: Payer: Self-pay | Admitting: Internal Medicine

## 2014-05-19 ENCOUNTER — Telehealth: Payer: Self-pay | Admitting: Internal Medicine

## 2014-05-19 DIAGNOSIS — N6320 Unspecified lump in the left breast, unspecified quadrant: Secondary | ICD-10-CM

## 2014-05-19 NOTE — Telephone Encounter (Signed)
LVM for pt to call.   Will send in the refills that she needs filled.

## 2014-05-19 NOTE — Telephone Encounter (Signed)
Patient called in to make CPE.  The first we could get her in was March 7th.  She will have scripts that will expire before them.  She would like to make sure that new [facility] once [facility] run out to get her though until March.

## 2014-05-24 ENCOUNTER — Ambulatory Visit
Admission: RE | Admit: 2014-05-24 | Discharge: 2014-05-24 | Disposition: A | Discharge status: Home / Self Care | Payer: 59 | Source: Ambulatory Visit | Attending: Internal Medicine | Admitting: Internal Medicine

## 2014-05-24 DIAGNOSIS — N6320 Unspecified lump in the left breast, unspecified quadrant: Secondary | ICD-10-CM

## 2014-06-18 ENCOUNTER — Ambulatory Visit (INDEPENDENT_AMBULATORY_CARE_PROVIDER_SITE_OTHER): Payer: 59 | Admitting: Family Medicine

## 2014-06-18 VITALS — BP 158/88 | HR 84 | Temp 97.8°F | Resp 16 | Ht 66.75 in | Wt 175.0 lb

## 2014-06-18 DIAGNOSIS — A09 Infectious gastroenteritis and colitis, unspecified: Secondary | ICD-10-CM

## 2014-06-18 DIAGNOSIS — R0981 Nasal congestion: Secondary | ICD-10-CM

## 2014-06-18 DIAGNOSIS — Z23 Encounter for immunization: Secondary | ICD-10-CM

## 2014-06-18 DIAGNOSIS — R059 Cough, unspecified: Secondary | ICD-10-CM

## 2014-06-18 DIAGNOSIS — R05 Cough: Secondary | ICD-10-CM

## 2014-06-18 MED ORDER — CIPROFLOXACIN HCL 500 MG PO TABS: 500.0000 mg | ORAL_TABLET | Freq: Two times a day (BID) | ORAL | Status: DC

## 2014-06-18 MED ORDER — HYDROCOD POLST-CHLORPHEN POLST 10-8 MG/5ML PO LQCR: 5.0000 mL | Freq: Every evening | ORAL | Status: DC | PRN

## 2014-06-18 NOTE — Patient Instructions (Signed)
Stop Claritin for 2-3 days Try mucinex and really increase fluids Afrin nasal spray for congestion and before flying if needed Sudafed- 1 tablet in the morning and 1 after lunch as needed for sinus pressure and congestion Saline nasal spray several times a day for dryness

## 2014-06-18 NOTE — Progress Notes (Signed)
   Subjective:    Patient ID: [name], female    DOB: 1951/02/19, 63 y.o.   MRN: [id number]  HPI Patient presents today with 1 week history of mild sore throat, headache under eyes, occasional productive cough. Productive in the morning of thick, yellow sputum at top of throat.Marland Kitchen  Has taken Delsym with some relief of cough and nasonex before bedtime with some relief of congestion. Also takes claritin daily. Feels like congestion "stuck." She also takes diphenhydramine nightly for sleep.  She will be traveling to [place] in about 10 days and requests something for Traveler's Diarrhea.    Review of Systems No fever, no ear pain, no CP, no SOB, no wheezing.     Objective:   Physical Exam  Vitals reviewed. Constitutional: She is oriented to person, place, and time. She appears well-developed and well-nourished.  HENT:  Head: Normocephalic and atraumatic.  Right Ear: Tympanic membrane, external ear and ear canal normal.  Left Ear: Tympanic membrane, external ear and ear canal normal.  Nose: Mucosal edema present. Right sinus exhibits no maxillary sinus tenderness and no frontal sinus tenderness. Left sinus exhibits no maxillary sinus tenderness and no frontal sinus tenderness.  Mouth/Throat: Uvula is midline and mucous membranes are normal. No oropharyngeal exudate, posterior oropharyngeal edema, posterior oropharyngeal erythema or tonsillar abscesses.  Eyes: Conjunctivae are normal.  Neck: Normal range of motion. Neck supple.  Cardiovascular: Normal rate, regular rhythm and normal heart sounds.   Pulmonary/Chest: Effort normal and breath sounds normal.  Musculoskeletal: Normal range of motion.  Lymphadenopathy:    She has no cervical adenopathy.  Neurological: She is alert and oriented to person, place, and time.  Skin: Skin is warm and dry.  Psychiatric: She has a normal mood and affect. Her behavior is normal. Judgment and thought content normal.      Assessment & Plan:  1.  Needs flu shot - Flu Vaccine QUAD 36+ mos IM  2. Traveler's diarrhea - ciprofloxacin (CIPRO) 500 MG tablet; Take 1 tablet (500 mg total) by mouth 2 (two) times daily.  Dispense: 14 tablet; Refill: 0  3. Cough - chlorpheniramine-HYDROcodone (TUSSIONEX PENNKINETIC ER) 10-8 MG/5ML LQCR; Take 5 mLs by mouth at bedtime as needed.  Dispense: 70 mL; Refill: 0  4. Sinus congestion - I think patient has gotten overly dry with Claritin and diphenhydramine. She does not have symptoms consistent with a bacterial infecction -patient instructions-  Stop Claritin for 2-3 days Try mucinex and really increase fluids Afrin nasal spray for congestion and before flying if needed Sudafed- 1 tablet in the morning and 1 after lunch as needed for sinus pressure and congestion Saline nasal spray several times a day for dryness  -I have instructed her to notify me by telephone if she is not better by Monday (3 days) and we can discuss further treatment.   Elby Beck, FNP-BC  [facility]  06/20/2014 8:35 PM

## 2014-06-19 ENCOUNTER — Encounter: Payer: Self-pay | Admitting: Internal Medicine

## 2014-06-19 ENCOUNTER — Other Ambulatory Visit: Payer: Self-pay

## 2014-06-19 MED ORDER — ATORVASTATIN CALCIUM 20 MG PO TABS: 20.0000 mg | ORAL_TABLET | Freq: Every day | ORAL | Status: DC

## 2014-06-19 MED ORDER — FUROSEMIDE 40 MG PO TABS: 40.0000 mg | ORAL_TABLET | Freq: Every day | ORAL | Status: DC

## 2014-06-19 MED ORDER — BUPROPION HCL ER (XL) 300 MG PO TB24: 300.0000 mg | ORAL_TABLET | Freq: Every day | ORAL | Status: DC

## 2014-06-24 ENCOUNTER — Ambulatory Visit (INDEPENDENT_AMBULATORY_CARE_PROVIDER_SITE_OTHER): Payer: 59 | Admitting: Family Medicine

## 2014-06-24 VITALS — BP 140/90 | HR 80 | Temp 97.6°F | Resp 16 | Ht 67.0 in | Wt 177.0 lb

## 2014-06-24 DIAGNOSIS — R05 Cough: Secondary | ICD-10-CM

## 2014-06-24 DIAGNOSIS — J0100 Acute maxillary sinusitis, unspecified: Secondary | ICD-10-CM

## 2014-06-24 DIAGNOSIS — R059 Cough, unspecified: Secondary | ICD-10-CM

## 2014-06-24 DIAGNOSIS — J209 Acute bronchitis, unspecified: Secondary | ICD-10-CM

## 2014-06-24 MED ORDER — METHYLPREDNISOLONE ACETATE 80 MG/ML IJ SUSP
80.0000 mg | Freq: Once | INTRAMUSCULAR | Status: AC
  Administered 2014-06-24: 80 mg via INTRAMUSCULAR

## 2014-06-24 MED ORDER — AZITHROMYCIN 250 MG PO TABS: ORAL_TABLET | ORAL | Status: DC

## 2014-06-24 NOTE — Progress Notes (Signed)
Patient ID: [name], female   DOB: Jun 26, 1951, 63 y.o.   MRN: [id number]  This chart was scribed for Robyn Haber, MD by Ladene Artist, ED Scribe. The patient was seen in room 4. Patient's care was started at 11:37 AM.  Patient ID: [name] MRN: [id number], DOB: 1951-07-12, 63 y.o. Date of Encounter: 06/24/2014, 11:41 AM  Primary Physician: Gwendolyn Grant, MD  Chief Complaint  Patient presents with   Nasal Congestion    x 2 weeks    HPI: 63 y.o. year old female with history below presents with nasal congestion over the past 2 weeks. Pt reports associated green/yellow discharge, chest congestion worse upon waking, cough, dry mouth. Symptoms are exacerbated when she is outside playing golf. She denies fever and sore throat. Pt was seen 06/18/14 by Clarene Reamer, FNP for similar symptoms and was advised to start Afrin and Sudafed. Pt tried Afrin for 3 days but reports R sided nose bleed the third day. She has also tried Delsym with relief.   Pt is going to [place] in 2 days. Her husband works there but is about to retire so they are "touring the world".   Past Medical History  Diagnosis Date   GERD (gastroesophageal reflux disease)    Hyperlipidemia     on statin   Depression    Insomnia    Seasonal allergies    Hypertension    Abnormal uterine bleeding    Arthritis     knees, hands   Irritable bowel syndrome     s/p subtotal colectomy '86>diarrhea prone   NEOPLASM, MALIGNANT, BASAL CELL, CARCINOMA, HX OF    POSTMENOPAUSAL STATUS     on HRT   Allergy      Home Meds: Prior to Admission medications   Medication Sig Start Date End Date Taking? Authorizing Provider  acetaminophen (TYLENOL) 500 MG tablet Take 500 mg by mouth every 6 (six) hours as needed. 2 or 3 tablets as needed for headache or pain   Yes Historical Provider, MD  atorvastatin (LIPITOR) 20 MG tablet Take 1 tablet (20 mg total) by mouth daily. 06/19/14  Yes Rowe Clack, MD    buPROPion (WELLBUTRIN XL) 300 MG 24 hr tablet Take 1 tablet (300 mg total) by mouth daily. 06/19/14  Yes Rowe Clack, MD  CALCIUM-VITAMIN D PO Take by mouth daily.   Yes Historical Provider, MD  chlorpheniramine-HYDROcodone (TUSSIONEX PENNKINETIC ER) 10-8 MG/5ML LQCR Take 5 mLs by mouth at bedtime as needed. 06/18/14  Yes Elby Beck, FNP  cholecalciferol (VITAMIN D) 1000 UNITS tablet Take 5,000 Units by mouth daily.     Yes Historical Provider, MD  ciprofloxacin (CIPRO) 500 MG tablet Take 1 tablet (500 mg total) by mouth 2 (two) times daily. 06/18/14  Yes Elby Beck, FNP  DiphenhydrAMINE HCl, Sleep, ([facility] SLEEP AID PO) Take 1 tablet by mouth at bedtime.   Yes Historical Provider, MD  diphenoxylate-atropine (LOMOTIL) 2.5-0.025 MG per tablet Take 1 tablet by mouth 4 (four) times daily as needed for diarrhea or loose stools.   Yes Historical Provider, MD  estradiol (VIVELLE-DOT) 0.05 MG/24HR Place 1 patch onto the skin 2 (two) times a week. Change on Wednesday and Saturday   Yes Historical Provider, MD  furosemide (LASIX) 40 MG tablet Take 1 tablet (40 mg total) by mouth daily. 06/19/14  Yes Rowe Clack, MD  Glucosamine-Chondroitin-MSM [phone] MG TABS Take 1 tablet by mouth daily. Triflex supplement    Yes Historical Provider,  MD  HYDROcodone-acetaminophen (LORCET) 10-650 MG per tablet Take 1 tablet by mouth every 6 (six) hours as needed for pain.   Yes Historical Provider, MD  lactase (LACTAID) 3000 UNITS tablet Take 3,000 Units by mouth 3 (three) times daily with meals.   Yes Historical Provider, MD  loratadine (CLARITIN) 10 MG tablet Take 10 mg by mouth daily.    Yes Historical Provider, MD  mometasone (NASONEX) 50 MCG/ACT nasal spray Place 2 sprays into the nose daily.    Yes Historical Provider, MD  promethazine (PHENERGAN) 25 MG tablet Take 25 mg by mouth every 6 (six) hours as needed for nausea or vomiting.   Yes Historical Provider, MD  Simethicone (PHAZYME PO) Take  by mouth daily.   Yes Historical Provider, MD    Allergies:  Allergies  Allergen Reactions   Nsaids Other (See Comments)    Hurts stomach    History   Social History   Marital Status: Married    Spouse Name: N/A    Number of Children: N/A   Years of Education: N/A   Occupational History   Not on file.   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 [employer] - 2 grown kids - enjoys golf   Alcohol Use: 0.0 oz/week     Comment: occasional/ 2 drinks a month   Drug Use: No   Sexual Activity: Yes    Museum/gallery curator: Post-menopausal, Surgical   Other Topics Concern   Not on file   Social History Narrative   No narrative on file     Review of Systems: Constitutional: negative for fever, chills, night sweats, weight changes, or fatigue  HEENT: negative for ST, vision changes, hearing loss, rhinorrhea, epistaxis, or sinus pressure, +congestion, +dry mouth, +nosebleed  Cardiovascular: negative for chest pain or palpitations Respiratory: negative for hemoptysis, wheezing, or shortness of breath, +cough Abdominal: negative for abdominal pain, nausea, vomiting, diarrhea, or constipation Dermatological: negative for rash Neurologic: negative for headache, dizziness, or syncope All other systems reviewed and are otherwise negative with the exception to those above and in the HPI.   Physical Exam: Triage Vitals: Blood pressure 140/90, pulse 80, temperature 97.6 F (36.4 C), temperature source Oral, resp. rate 16, height 5\' 7"  (1.702 m), weight 177 lb (80.287 kg), SpO2 97.00%., Body mass index is 27.72 kg/(m^2). General: Well developed, well nourished, in no acute distress. Head: Normocephalic, atraumatic, eyes without discharge, sclera non-icteric, nares are without discharge. Yellow eschar in R nasal septum. Bilateral auditory canals clear,  TM's are without perforation, pearly grey and translucent with reflective cone of light bilaterally. Oral cavity moist, posterior pharynx without exudate, erythema, peritonsillar abscess, or post nasal drip.  Neck: Supple. No thyromegaly. Full ROM. No lymphadenopathy. Lungs: Clear bilaterally to auscultation without wheezes, rales, or rhonchi. Breathing is unlabored. Heart: RRR with S1 S2. No murmurs, rubs, or gallops appreciated. Abdomen: Soft, non-tender, non-distended with normoactive bowel sounds. No hepatomegaly. No rebound/guarding. No obvious abdominal masses. Msk:  Strength and tone normal for age. Extremities/Skin: Warm and dry. No clubbing or cyanosis. No edema. No rashes or suspicious lesions. Neuro: Alert and oriented X 3. Moves all extremities spontaneously. Gait is normal. CNII-XII grossly in tact. Psych:  Responds to questions appropriately with a normal affect.   Labs:  ASSESSMENT AND PLAN:  63 y.o. year old female with  1. Cough  2. Acute bronchitis, unspecified organism   3. Acute maxillary sinusitis, recurrence not specified    I personally performed the services described in this documentation, which was scribed in my presence. The recorded information has been reviewed and is accurate. Cough - Plan: methylPREDNISolone acetate (DEPO-MEDROL) injection 80 mg  Acute bronchitis, unspecified organism - Plan: methylPREDNISolone acetate (DEPO-MEDROL) injection 80 mg  Acute maxillary sinusitis, recurrence not specified - Plan: methylPREDNISolone acetate (DEPO-MEDROL) injection 80 mg   Recommend ASA prior to the long distance flight  Signed, Robyn Haber, MD 06/24/2014 11:41 AM

## 2014-08-02 ENCOUNTER — Other Ambulatory Visit: Payer: Self-pay | Admitting: Obstetrics and Gynecology

## 2014-08-04 LAB — CYTOLOGY - PAP

## 2014-08-24 ENCOUNTER — Other Ambulatory Visit: Payer: Self-pay

## 2014-08-24 DIAGNOSIS — Z1231 Encounter for screening mammogram for malignant neoplasm of breast: Secondary | ICD-10-CM

## 2014-08-26 ENCOUNTER — Ambulatory Visit
Admission: RE | Admit: 2014-08-26 | Discharge: 2014-08-26 | Disposition: A | Discharge status: Home / Self Care | Payer: 59 | Source: Ambulatory Visit

## 2014-08-26 DIAGNOSIS — Z1231 Encounter for screening mammogram for malignant neoplasm of breast: Secondary | ICD-10-CM

## 2014-11-15 ENCOUNTER — Encounter: Payer: 59 | Admitting: Internal Medicine

## 2014-11-17 ENCOUNTER — Other Ambulatory Visit (INDEPENDENT_AMBULATORY_CARE_PROVIDER_SITE_OTHER): Payer: 59

## 2014-11-17 ENCOUNTER — Ambulatory Visit (INDEPENDENT_AMBULATORY_CARE_PROVIDER_SITE_OTHER): Payer: 59 | Admitting: Internal Medicine

## 2014-11-17 ENCOUNTER — Encounter: Payer: Self-pay | Admitting: Internal Medicine

## 2014-11-17 VITALS — BP 138/86 | HR 77 | Temp 98.4°F | Resp 16 | Wt 173.0 lb

## 2014-11-17 DIAGNOSIS — I10 Essential (primary) hypertension: Secondary | ICD-10-CM

## 2014-11-17 DIAGNOSIS — E785 Hyperlipidemia, unspecified: Secondary | ICD-10-CM

## 2014-11-17 DIAGNOSIS — Z0000 Encounter for general adult medical examination without abnormal findings: Secondary | ICD-10-CM

## 2014-11-17 DIAGNOSIS — Z85828 Personal history of other malignant neoplasm of skin: Secondary | ICD-10-CM

## 2014-11-17 LAB — COMPREHENSIVE METABOLIC PANEL
ALBUMIN: 4.9 g/dL (ref 3.5–5.2)
ALT: 16 U/L (ref 0–35)
AST: 17 U/L (ref 0–37)
Alkaline Phosphatase: 65 U/L (ref 39–117)
BUN: 16 mg/dL (ref 6–23)
CALCIUM: 10.2 mg/dL (ref 8.4–10.5)
CHLORIDE: 102 meq/L (ref 96–112)
CO2: 31 mEq/L (ref 19–32)
Creatinine, Ser: 0.9 mg/dL (ref 0.40–1.20)
GFR: 66.9 mL/min (ref >60.00)
Glucose, Bld: 82 mg/dL (ref 70–99)
POTASSIUM: 4 meq/L (ref 3.5–5.1)
SODIUM: 141 meq/L (ref 135–145)
TOTAL PROTEIN: 7.4 g/dL (ref 6.0–8.3)
Total Bilirubin: 0.6 mg/dL (ref 0.2–1.2)

## 2014-11-17 LAB — LIPID PANEL
CHOLESTEROL: 150 mg/dL (ref 0–200)
HDL: 56.8 mg/dL (ref >39.00)
LDL Cholesterol: 73 mg/dL (ref 0–99)
NonHDL: 93.2
Total CHOL/HDL Ratio: 3
Triglycerides: 101 mg/dL (ref 0.0–149.0)
VLDL: 20.2 mg/dL (ref 0.0–40.0)

## 2014-11-17 LAB — CBC
HCT: 44.6 % (ref 36.0–46.0)
Hemoglobin: 15 g/dL (ref 12.0–15.0)
MCHC: 33.7 g/dL (ref 30.0–36.0)
MCV: 89.4 fl (ref 78.0–100.0)
Platelets: 261 10*3/uL (ref 150.0–400.0)
RBC: 4.99 Mil/uL (ref 3.87–5.11)
RDW: 12.9 % (ref 11.5–15.5)
WBC: 6.7 10*3/uL (ref 4.0–10.5)

## 2014-11-17 LAB — TSH: TSH: 1.58 u[IU]/mL (ref 0.35–4.50)

## 2014-11-17 MED ORDER — PROMETHAZINE HCL 25 MG PO TABS: 25.0000 mg | ORAL_TABLET | Freq: Four times a day (QID) | ORAL | Status: DC | PRN

## 2014-11-17 MED ORDER — BUPROPION HCL ER (XL) 300 MG PO TB24: 300.0000 mg | ORAL_TABLET | Freq: Every day | ORAL | Status: DC

## 2014-11-17 MED ORDER — ATORVASTATIN CALCIUM 20 MG PO TABS: 20.0000 mg | ORAL_TABLET | Freq: Every day | ORAL | Status: DC

## 2014-11-17 MED ORDER — FUROSEMIDE 40 MG PO TABS: 40.0000 mg | ORAL_TABLET | Freq: Every day | ORAL | Status: DC

## 2014-11-17 NOTE — Patient Instructions (Signed)
We will check the blood work today and I have sent in the refills for your medicines including the phenergan.   We will call you back about the blood work.   Keep up the good work by staying active. Remember to cover in the sun or use sunscreen to protect your skin.   Health Maintenance Adopting a healthy lifestyle and getting preventive care can go a long way to promote health and wellness. Talk with your health care provider about what schedule of regular examinations is right for you. This is a good chance for you to check in with your provider about disease prevention and staying healthy. In between checkups, there are plenty of things you can do on your own. Experts have done a lot of research about which lifestyle changes and preventive measures are most likely to keep you healthy. Ask your health care provider for more information. WEIGHT AND DIET  Eat a healthy diet  Be sure to include plenty of vegetables, fruits, low-fat dairy products, and lean protein.  Do not eat a lot of foods high in solid fats, added sugars, or salt.  Get regular exercise. This is one of the most important things you can do for your health.  Most adults should exercise for at least 150 minutes each week. The exercise should increase your heart rate and make you sweat (moderate-intensity exercise).  Most adults should also do strengthening exercises at least twice a week. This is in addition to the moderate-intensity exercise.  Maintain a healthy weight  Body mass index (BMI) is a measurement that can be used to identify possible weight problems. It estimates body fat based on height and weight. Your health care provider can help determine your BMI and help you achieve or maintain a healthy weight.  For females 77 years of age and older:   A BMI below 18.5 is considered underweight.  A BMI of 18.5 to 24.9 is normal.  A BMI of 25 to 29.9 is considered overweight.  A BMI of 30 and above is considered  obese.  Watch levels of cholesterol and blood lipids  You should start having your blood tested for lipids and cholesterol at 65 years of age, then have this test every 5 years.  You may need to have your cholesterol levels checked more often if:  Your lipid or cholesterol levels are high.  You are older than 65 years of age.  You are at high risk for heart disease.  CANCER SCREENING   Lung Cancer  Lung cancer screening is recommended for adults 50-82 years old who are at high risk for lung cancer because of a history of smoking.  A yearly low-dose CT scan of the lungs is recommended for people who:  Currently smoke.  Have quit within the past 15 years.  Have at least a 30-pack-year history of smoking. A pack year is smoking an average of one pack of cigarettes a day for 1 year.  Yearly screening should continue until it has been 15 years since you quit.  Yearly screening should stop if you develop a health problem that would prevent you from having lung cancer treatment.  Breast Cancer  Practice breast self-awareness. This means understanding how your breasts normally appear and feel.  It also means doing regular breast self-exams. Let your health care provider know about any changes, no matter how small.  If you are in your 20s or 30s, you should have a clinical breast exam (CBE) by a health  care provider every 1-3 years as part of a regular health exam.  If you are 73 or older, have a CBE every year. Also consider having a breast X-ray (mammogram) every year.  If you have a family history of breast cancer, talk to your health care provider about genetic screening.  If you are at high risk for breast cancer, talk to your health care provider about having an MRI and a mammogram every year.  Breast cancer gene (BRCA) assessment is recommended for women who have family members with BRCA-related cancers. BRCA-related cancers  include:  Breast.  Ovarian.  Tubal.  Peritoneal cancers.  Results of the assessment will determine the need for genetic counseling and BRCA1 and BRCA2 testing. Cervical Cancer Routine pelvic examinations to screen for cervical cancer are no longer recommended for nonpregnant women who are considered low risk for cancer of the pelvic organs (ovaries, uterus, and vagina) and who do not have symptoms. A pelvic examination may be necessary if you have symptoms including those associated with pelvic infections. Ask your health care provider if a screening pelvic exam is right for you.   The Pap test is the screening test for cervical cancer for women who are considered at risk.  If you had a hysterectomy for a problem that was not cancer or a condition that could lead to cancer, then you no longer need Pap tests.  If you are older than 65 years, and you have had normal Pap tests for the past 10 years, you no longer need to have Pap tests.  If you have had past treatment for cervical cancer or a condition that could lead to cancer, you need Pap tests and screening for cancer for at least 20 years after your treatment.  If you no longer get a Pap test, assess your risk factors if they change (such as having a new sexual partner). This can affect whether you should start being screened again.  Some women have medical problems that increase their chance of getting cervical cancer. If this is the case for you, your health care provider may recommend more frequent screening and Pap tests.  The human papillomavirus (HPV) test is another test that may be used for cervical cancer screening. The HPV test looks for the virus that can cause cell changes in the cervix. The cells collected during the Pap test can be tested for HPV.  The HPV test can be used to screen women 48 years of age and older. Getting tested for HPV can extend the interval between normal Pap tests from three to five years.  An HPV  test also should be used to screen women of any age who have unclear Pap test results.  After 65 years of age, women should have HPV testing as often as Pap tests.  Colorectal Cancer  This type of cancer can be detected and often prevented.  Routine colorectal cancer screening usually begins at 65 years of age and continues through 65 years of age.  Your health care provider may recommend screening at an earlier age if you have risk factors for colon cancer.  Your health care provider may also recommend using home test kits to check for hidden blood in the stool.  A small camera at the end of a tube can be used to examine your colon directly (sigmoidoscopy or colonoscopy). This is done to check for the earliest forms of colorectal cancer.  Routine screening usually begins at age 62.  Direct examination of the  colon should be repeated every 5-10 years through 65 years of age. However, you may need to be screened more often if early forms of precancerous polyps or small growths are found. Skin Cancer  Check your skin from head to toe regularly.  Tell your health care provider about any new moles or changes in moles, especially if there is a change in a mole's shape or color.  Also tell your health care provider if you have a mole that is larger than the size of a pencil eraser.  Always use sunscreen. Apply sunscreen liberally and repeatedly throughout the day.  Protect yourself by wearing long sleeves, pants, a wide-brimmed hat, and sunglasses whenever you are outside. HEART DISEASE, DIABETES, AND HIGH BLOOD PRESSURE   Have your blood pressure checked at least every 1-2 years. High blood pressure causes heart disease and increases the risk of stroke.  If you are between 78 years and 29 years old, ask your health care provider if you should take aspirin to prevent strokes.  Have regular diabetes screenings. This involves taking a blood sample to check your fasting blood sugar  level.  If you are at a normal weight and have a low risk for diabetes, have this test once every three years after 66 years of age.  If you are overweight and have a high risk for diabetes, consider being tested at a younger age or more often. PREVENTING INFECTION  Hepatitis B  If you have a higher risk for hepatitis B, you should be screened for this virus. You are considered at high risk for hepatitis B if:  You were born in a country where hepatitis B is common. Ask your health care provider which countries are considered high risk.  Your parents were born in a high-risk country, and you have not been immunized against hepatitis B (hepatitis B vaccine).  You have HIV or AIDS.  You use needles to inject street drugs.  You live with someone who has hepatitis B.  You have had sex with someone who has hepatitis B.  You get hemodialysis treatment.  You take certain medicines for conditions, including cancer, organ transplantation, and autoimmune conditions. Hepatitis C  Blood testing is recommended for:  Everyone born from [birth year] through 1966.  Anyone with known risk factors for hepatitis C. Sexually transmitted infections (STIs)  You should be screened for sexually transmitted infections (STIs) including gonorrhea and chlamydia if:  You are sexually active and are younger than 65 years of age.  You are older than 65 years of age and your health care provider tells you that you are at risk for this type of infection.  Your sexual activity has changed since you were last screened and you are at an increased risk for chlamydia or gonorrhea. Ask your health care provider if you are at risk.  If you do not have HIV, but are at risk, it may be recommended that you take a prescription medicine daily to prevent HIV infection. This is called pre-exposure prophylaxis (PrEP). You are considered at risk if:  You are sexually active and do not regularly use condoms or know the HIV status  of your partner(s).  You take drugs by injection.  You are sexually active with a partner who has HIV. Talk with your health care provider about whether you are at high risk of being infected with HIV. If you choose to begin PrEP, you should first be tested for HIV. You should then be tested every 3 months  for as long as you are taking PrEP.  PREGNANCY   If you are premenopausal and you may become pregnant, ask your health care provider about preconception counseling.  If you may become pregnant, take 400 to 800 micrograms (mcg) of folic acid every day.  If you want to prevent pregnancy, talk to your health care provider about birth control (contraception). OSTEOPOROSIS AND MENOPAUSE   Osteoporosis is a disease in which the bones lose minerals and strength with aging. This can result in serious bone fractures. Your risk for osteoporosis can be identified using a bone density scan.  If you are 64 years of age or older, or if you are at risk for osteoporosis and fractures, ask your health care provider if you should be screened.  Ask your health care provider whether you should take a calcium or vitamin D supplement to lower your risk for osteoporosis.  Menopause may have certain physical symptoms and risks.  Hormone replacement therapy may reduce some of these symptoms and risks. Talk to your health care provider about whether hormone replacement therapy is right for you.  HOME CARE INSTRUCTIONS   Schedule regular health, dental, and eye exams.  Stay current with your immunizations.   Do not use any tobacco products including cigarettes, chewing tobacco, or electronic cigarettes.  If you are pregnant, do not drink alcohol.  If you are breastfeeding, limit how much and how often you drink alcohol.  Limit alcohol intake to no more than 1 drink per day for nonpregnant women. One drink equals 12 ounces of beer, 5 ounces of wine, or 1 ounces of hard liquor.  Do not use street  drugs.  Do not share needles.  Ask your health care provider for help if you need support or information about quitting drugs.  Tell your health care provider if you often feel depressed.  Tell your health care provider if you have ever been abused or do not feel safe at home. Document Released: 03/12/2011 Document Revised: 01/11/2014 Document Reviewed: 07/29/2013 [facility] Patient Information 2015 [place], [place]. This information is not intended to replace advice given to you by your health care provider. Make sure you discuss any questions you have with your health care provider.

## 2014-11-17 NOTE — Progress Notes (Signed)
Pre visit review using our clinic review tool, if applicable. No additional management support is needed unless otherwise documented below in the visit note. 

## 2014-11-18 ENCOUNTER — Other Ambulatory Visit: Payer: Self-pay | Admitting: Geriatric Medicine

## 2014-11-18 ENCOUNTER — Telehealth: Payer: Self-pay | Admitting: Internal Medicine

## 2014-11-18 DIAGNOSIS — Z0000 Encounter for general adult medical examination without abnormal findings: Secondary | ICD-10-CM | POA: Insufficient documentation

## 2014-11-18 MED ORDER — FUROSEMIDE 40 MG PO TABS: 40.0000 mg | ORAL_TABLET | Freq: Every day | ORAL | Status: DC

## 2014-11-18 MED ORDER — ATORVASTATIN CALCIUM 20 MG PO TABS: 20.0000 mg | ORAL_TABLET | Freq: Every day | ORAL | Status: DC

## 2014-11-18 MED ORDER — BUPROPION HCL ER (XL) 300 MG PO TB24: 300.0000 mg | ORAL_TABLET | Freq: Every day | ORAL | Status: DC

## 2014-11-18 MED ORDER — PROMETHAZINE HCL 25 MG PO TABS: 25.0000 mg | ORAL_TABLET | Freq: Four times a day (QID) | ORAL | Status: DC | PRN

## 2014-11-18 NOTE — Telephone Encounter (Signed)
Sent to pharmacy 

## 2014-11-18 NOTE — Progress Notes (Signed)
   Subjective:    Patient ID: [name], female    DOB: [birth year], 64 y.o.   MRN: [id number]  HPI The patient is a 64 YO female who is coming in for wellness. She is doing well overall and has no new problems since last visit. Her mood is doing well and is controlled. She is still having some off and on problems with her bowels but with dietary changes does okay.   PMH, [facility], social history reviewed and updated at today's visit.   Review of Systems  Constitutional: Negative for fever, activity change, appetite change, fatigue and unexpected weight change.  HENT: Negative.   Eyes: Negative.   Respiratory: Negative for cough, chest tightness, shortness of breath and wheezing.   Cardiovascular: Negative for chest pain, palpitations and leg swelling.  Gastrointestinal: Positive for nausea and diarrhea. Negative for vomiting, abdominal pain, constipation and abdominal distention.  Endocrine: Negative.   Musculoskeletal: Positive for arthralgias. Negative for back pain and gait problem.  Skin: Negative.   Neurological: Negative.   Psychiatric/Behavioral: Negative.       Objective:   Physical Exam  Constitutional: She is oriented to person, place, and time. She appears well-developed and well-nourished.  Overweight  HENT:  Head: Normocephalic and atraumatic.  Eyes: EOM are normal.  Neck: Normal range of motion.  Cardiovascular: Normal rate and regular rhythm.   Carotids without bruits.   Pulmonary/Chest: Effort normal and breath sounds normal. No respiratory distress. She has no wheezes. She has no rales.  Abdominal: Soft. She exhibits no distension. There is no tenderness.  Neurological: She is alert and oriented to person, place, and time. Coordination normal.  Skin: Skin is warm and dry.  Psychiatric: She has a normal mood and affect.   Filed Vitals:   11/17/14 0931  BP: 138/86  Pulse: 77  Temp: 98.4 F (36.9 C)  TempSrc: Oral  Resp: 16  Weight: 173 lb (78.472 kg)    SpO2: 96%      Assessment & Plan:

## 2014-11-18 NOTE — Assessment & Plan Note (Signed)
Follows with dermatology yearly and several more lesions were removed at her last visit.

## 2014-11-18 NOTE — Telephone Encounter (Signed)
Pt called said that 3 of her meds were sent to [facility] on the 9th but they were suppose to be sent to [facility]

## 2014-11-18 NOTE — Assessment & Plan Note (Signed)
BP well controlled on lasix 40 mg daily. If BP elevated in the future can consider change to HCTZ from lasix. Check BMP.

## 2014-11-18 NOTE — Assessment & Plan Note (Signed)
Continue atorvastatin. Check lipid panel. No side effects at this time.

## 2014-11-18 NOTE — Assessment & Plan Note (Signed)
Mammogram up to date, pap smear up to date (likely does not need further as almost aged out of screening). Colonoscopy due in 2023. Flu shot done. Shingles done. Pneumonia series done. Tetanus up to date (due in 2023). We did talk >5 minutes about sun safety and sunscreen and covering while in the sun. She is not sure she can remember to use sunscreen and does not cover well while she is golfing (almost every day in the summer). Non-smoker.

## 2015-06-08 DIAGNOSIS — C44729 Squamous cell carcinoma of skin of left lower limb, including hip: Secondary | ICD-10-CM | POA: Diagnosis not present

## 2015-06-08 DIAGNOSIS — L814 Other melanin hyperpigmentation: Secondary | ICD-10-CM | POA: Diagnosis not present

## 2015-06-08 DIAGNOSIS — L821 Other seborrheic keratosis: Secondary | ICD-10-CM | POA: Diagnosis not present

## 2015-06-08 DIAGNOSIS — Z85828 Personal history of other malignant neoplasm of skin: Secondary | ICD-10-CM | POA: Diagnosis not present

## 2015-06-08 DIAGNOSIS — D1801 Hemangioma of skin and subcutaneous tissue: Secondary | ICD-10-CM | POA: Diagnosis not present

## 2015-06-08 DIAGNOSIS — L851 Acquired keratosis [keratoderma] palmaris et plantaris: Secondary | ICD-10-CM | POA: Diagnosis not present

## 2015-06-08 DIAGNOSIS — L570 Actinic keratosis: Secondary | ICD-10-CM | POA: Diagnosis not present

## 2015-06-08 DIAGNOSIS — D485 Neoplasm of uncertain behavior of skin: Secondary | ICD-10-CM | POA: Diagnosis not present

## 2015-06-08 DIAGNOSIS — D225 Melanocytic nevi of trunk: Secondary | ICD-10-CM | POA: Diagnosis not present

## 2015-06-08 DIAGNOSIS — Z86018 Personal history of other benign neoplasm: Secondary | ICD-10-CM | POA: Diagnosis not present

## 2015-06-09 ENCOUNTER — Ambulatory Visit (INDEPENDENT_AMBULATORY_CARE_PROVIDER_SITE_OTHER): Payer: Medicare Other | Admitting: Family Medicine

## 2015-06-09 VITALS — BP 124/72 | HR 102 | Temp 98.4°F | Resp 17 | Ht 67.0 in | Wt 174.0 lb

## 2015-06-09 DIAGNOSIS — R109 Unspecified abdominal pain: Secondary | ICD-10-CM | POA: Diagnosis not present

## 2015-06-09 DIAGNOSIS — Z23 Encounter for immunization: Secondary | ICD-10-CM

## 2015-06-09 DIAGNOSIS — R319 Hematuria, unspecified: Secondary | ICD-10-CM | POA: Diagnosis not present

## 2015-06-09 LAB — POCT URINALYSIS DIP (MANUAL ENTRY)
Bilirubin, UA: NEGATIVE
Glucose, UA: NEGATIVE
Ketones, POC UA: NEGATIVE
Nitrite, UA: NEGATIVE
Protein Ur, POC: >=300 — AB
Spec Grav, UA: 1.025
Urobilinogen, UA: 0.2
pH, UA: 5.5

## 2015-06-09 LAB — POC MICROSCOPIC URINALYSIS (UMFC): Mucus: ABSENT

## 2015-06-09 MED ORDER — CIPROFLOXACIN HCL 500 MG PO TABS: 500.0000 mg | ORAL_TABLET | Freq: Two times a day (BID) | ORAL | Status: DC

## 2015-06-09 NOTE — Progress Notes (Addendum)
   Subjective:    Patient ID: [name], female    DOB: [birth year], 64 y.o.   MRN: [id number] This chart was scribed for Robyn Haber, MD by Zola Button, Medical Scribe. This patient was seen in Room 11 and the patient's care was started at 4:08 PM.   HPI HPI Comments: JEFFREY VOTH is a 64 y.o. female with a history of hypertension who presents to the [facility] complaining of blood clots in her urine. Patient states she first passed a blood clot in her urine around 10:00 AM today, then noticed a smaller blood clot after wiping around 2:00 PM this afternoon. She notes that she did have some urinary urgency and decreased urine this morning before passing the blood clot, but this was before she took her fluid pill. Patient has had left flank pain for about a month, which she initially thought was due to her hip. She also had diarrhea this afternoon. Patient denies fever. PSHx includes tubal ligation.  Her husband had radiation implants for prostate cancer today.  Review of Systems  Constitutional: Negative for fever.  Gastrointestinal: Positive for diarrhea.  Genitourinary: Positive for urgency, hematuria, flank pain and decreased urine volume.       Objective:   Physical Exam CONSTITUTIONAL: Well developed/well nourished HEAD: Normocephalic/atraumatic EYES: EOM/PERRL ENMT: Mucous membranes moist NECK: supple no meningeal signs SPINE: entire spine nontender CV: S1/S2 noted, no murmurs/rubs/gallops noted LUNGS: Lungs are clear to auscultation bilaterally, no apparent distress ABDOMEN: soft, nontender, no rebound or guarding GU: no cva tenderness NEURO: Pt is awake/alert, moves all extremitiesx4 EXTREMITIES: pulses normal, full ROM SKIN: warm, color normal PSYCH: no abnormalities of mood noted  Results for orders placed or performed in visit on 06/09/15  POCT urinalysis dipstick  Result Value Ref Range   Color, UA yellow yellow   Clarity, UA cloudy  (A) clear   Glucose, UA negative negative   Bilirubin, UA negative negative   Ketones, POC UA negative negative   Spec Grav, UA 1.025    Blood, UA large (A) negative   pH, UA 5.5    Protein Ur, POC >=300 (A) negative   Urobilinogen, UA 0.2    Nitrite, UA Negative Negative   Leukocytes, UA large (3+) (A) Negative  POCT Microscopic Urinalysis ([facility])  Result Value Ref Range   WBC,UR,HPF,POC Many (A) None WBC/hpf   RBC,UR,HPF,POC Many (A) None RBC/hpf   Bacteria Few (A) None   Mucus Absent Absent   Epithelial Cells, UR Per Microscopy None None cells/hpf         Assessment & Plan:   By signing my name below, I, Zola Button, attest that this documentation has been prepared under the direction and in the presence of Robyn Haber, MD.  Electronically Signed: Zola Button, Medical Scribe. 06/09/2015. 4:08 PM.   This chart was scribed in my presence and reviewed by me personally.    ICD-9-CM ICD-10-CM   1. Hematuria 599.70 R31.9 POCT urinalysis dipstick     POCT Microscopic Urinalysis ([facility])     Urine culture     ciprofloxacin (CIPRO) 500 MG tablet  2. Left flank pain 789.09 R10.9 POCT urinalysis dipstick     POCT Microscopic Urinalysis ([facility])     Urine culture     ciprofloxacin (CIPRO) 500 MG tablet  3. Need for prophylactic vaccination and inoculation against influenza V04.81 Z23 Flu Vaccine QUAD 36+ mos IM     Signed, Robyn Haber, MD

## 2015-06-09 NOTE — Patient Instructions (Signed)

## 2015-06-12 LAB — URINE CULTURE: Colony Count: 40000

## 2015-06-13 NOTE — Progress Notes (Signed)
Need orders in epic for 07-04-15 surgery

## 2015-06-16 ENCOUNTER — Ambulatory Visit: Payer: Self-pay | Admitting: Orthopedic Surgery

## 2015-06-16 NOTE — Progress Notes (Signed)
Preoperative surgical orders have been place into the Epic hospital system for [name] on 06/16/2015, 9:39 AM  by Mickel Crow for surgery on 07-04-2015.  Preop Hip orders including Experel Injecion, IV Tylenol, and IV Decadron as long as there are no contraindications to the above medications. Arlee Muslim, PA-C

## 2015-06-29 NOTE — Patient Instructions (Addendum)
YOUR PROCEDURE IS SCHEDULED ON :  07/04/15  REPORT TO [facility] MAIN ENTRANCE FOLLOW SIGNS TO EAST ELEVATOR - GO TO 3rd FLOOR CHECK IN AT 3 EAST NURSES STATION (SHORT STAY) AT:  10:45 AM  CALL THIS NUMBER IF YOU HAVE PROBLEMS THE MORNING OF SURGERY [phone]  REMEMBER:ONLY 1 PER PERSON MAY GO TO SHORT STAY WITH YOU TO GET READY THE MORNING OF YOUR SURGERY  DO NOT EAT FOOD  AFTER MIDNIGHT  MAY HAVE CLEAR LIQUIDS UNTIL 7:45 AM  TAKE THESE MEDICINES THE MORNING OF SURGERY:  LIPITOR / WELLBUTRIN / CLARITIN / MAY TAKE LORCET IF NEEDED / [place] IF NEEDED  CLEAR LIQUID DIET   Foods Allowed                                                                     Foods Excluded  Coffee and tea, regular and decaf                             liquids that you cannot  Plain Jell-O in any flavor                                             see through such as: Fruit ices (not with fruit pulp)                                     milk, soups, orange juice  Iced Popsicles                                                 All solid food Carbonated beverages, regular and diet                                    Cranberry, grape and apple juices Sports drinks like Gatorade Lightly seasoned clear broth or consume(fat free) Sugar, honey syrup  _____________________________________________________________________    YOU MAY NOT HAVE ANY METAL ON YOUR BODY INCLUDING HAIR PINS AND PIERCING'S. DO NOT WEAR JEWELRY, MAKEUP, LOTIONS, POWDERS OR PERFUMES. DO NOT WEAR NAIL POLISH. DO NOT SHAVE 48 HRS PRIOR TO SURGERY. MEN MAY SHAVE FACE AND NECK.  DO NOT [place] IS NOT RESPONSIBLE FOR VALUABLES.  CONTACTS, DENTURES OR PARTIALS MAY NOT BE WORN TO SURGERY. LEAVE SUITCASE IN CAR. CAN BE BROUGHT TO ROOM AFTER SURGERY.  PATIENTS DISCHARGED THE DAY OF SURGERY WILL NOT BE ALLOWED TO DRIVE HOME.  PLEASE READ OVER THE FOLLOWING INSTRUCTION  SHEETS _________________________________________________________________________________                                          [facility] - PREPARING FOR  SURGERY  Before surgery, you can play an important role.  Because skin is not sterile, your skin needs to be as free of germs as possible.  You can reduce the number of germs on your skin by washing with CHG (chlorahexidine gluconate) soap before surgery.  CHG is an antiseptic cleaner which kills germs and bonds with the skin to continue killing germs even after washing. Please DO NOT use if you have an allergy to CHG or antibacterial soaps.  If your skin becomes reddened/irritated stop using the CHG and inform your nurse when you arrive at Short Stay. Do not shave (including legs and underarms) for at least 48 hours prior to the first CHG shower.  You may shave your face. Please follow these instructions carefully:   1.  Shower with CHG Soap the night before surgery and the  morning of Surgery.   2.  If you choose to wash your hair, wash your hair first as usual with your  normal  Shampoo.   3.  After you shampoo, rinse your hair and body thoroughly to remove the  shampoo.                                         4.  Use CHG as you would any other liquid soap.  You can apply chg directly  to the skin and wash . Gently wash with scrungie or clean wascloth    5.  Apply the CHG Soap to your body ONLY FROM THE NECK DOWN.   Do not use on open                           Wound or open sores. Avoid contact with eyes, ears mouth and genitals (private parts).                        Genitals (private parts) with your normal soap.              6.  Wash thoroughly, paying special attention to the area where your surgery  will be performed.   7.  Thoroughly rinse your body with warm water from the neck down.   8.  DO NOT shower/wash with your normal soap after using and rinsing off  the CHG Soap .                9.  Pat yourself dry with a clean  towel.             10.  Wear clean night clothes to bed after shower             11.  Place clean sheets on your bed the night of your first shower and do not  sleep with pets.  Day of Surgery : Do not apply any lotions/deodorants the morning of surgery.  Please wear clean clothes to the hospital/surgery center.  FAILURE TO FOLLOW THESE INSTRUCTIONS MAY RESULT IN THE CANCELLATION OF YOUR SURGERY    PATIENT SIGNATURE_________________________________  ______________________________________________________________________     [name]  An incentive spirometer is a tool that can help keep your lungs clear and active. This tool measures how well you are filling your lungs with each breath. Taking long deep breaths may help reverse or decrease the chance of developing breathing (pulmonary) problems (especially infection) following:  A long period of time when you are unable to move or be active. BEFORE THE PROCEDURE   If the spirometer includes an indicator to show your best effort, your nurse or respiratory therapist will set it to a desired goal.  If possible, sit up straight or lean slightly forward. Try not to slouch.  Hold the incentive spirometer in an upright position. INSTRUCTIONS FOR USE  1. Sit on the edge of your bed if possible, or sit up as far as you can in bed or on a chair. 2. Hold the incentive spirometer in an upright position. 3. Breathe out normally. 4. Place the mouthpiece in your mouth and seal your lips tightly around it. 5. Breathe in slowly and as deeply as possible, raising the piston or the ball toward the top of the column. 6. Hold your breath for 3-5 seconds or for as long as possible. Allow the piston or ball to fall to the bottom of the column. 7. Remove the mouthpiece from your mouth and breathe out normally. 8. Rest for a few seconds and repeat Steps 1 through 7 at least 10 times every 1-2 hours when you are awake. Take your time and take  a few normal breaths between deep breaths. 9. The spirometer may include an indicator to show your best effort. Use the indicator as a goal to work toward during each repetition. 10. After each set of 10 deep breaths, practice coughing to be sure your lungs are clear. If you have an incision (the cut made at the time of surgery), support your incision when coughing by placing a pillow or rolled up towels firmly against it. Once you are able to get out of bed, walk around indoors and cough well. You may stop using the incentive spirometer when instructed by your caregiver.  RISKS AND COMPLICATIONS  Take your time so you do not get dizzy or light-headed.  If you are in pain, you may need to take or ask for pain medication before doing incentive spirometry. It is harder to take a deep breath if you are having pain. AFTER USE  Rest and breathe slowly and easily.  It can be helpful to keep track of a log of your progress. Your caregiver can provide you with a simple table to help with this. If you are using the spirometer at home, follow these instructions: [place] IF:   You are having difficultly using the spirometer.  You have trouble using the spirometer as often as instructed.  Your pain medication is not giving enough relief while using the spirometer.  You develop fever of 100.5 F (38.1 C) or higher. SEEK IMMEDIATE MEDICAL CARE IF:   You cough up bloody sputum that had not been present before.  You develop fever of 102 F (38.9 C) or greater.  You develop worsening pain at or near the incision site. MAKE SURE YOU:   Understand these instructions.  Will watch your condition.  Will get help right away if you are not doing well or get worse. Document Released: 01/07/2007 Document Revised: 11/19/2011 Document Reviewed: 03/10/2007 [facility] Patient Information 2014 [place], [place].   ________________________________________________________________________

## 2015-06-30 ENCOUNTER — Encounter (HOSPITAL_COMMUNITY)
Admission: RE | Admit: 2015-06-30 | Discharge: 2015-06-30 | Disposition: A | Discharge status: Home / Self Care | Payer: Medicare Other | Source: Ambulatory Visit | Attending: Orthopedic Surgery | Admitting: Orthopedic Surgery

## 2015-06-30 ENCOUNTER — Encounter (HOSPITAL_COMMUNITY): Payer: Self-pay

## 2015-06-30 DIAGNOSIS — Z01818 Encounter for other preprocedural examination: Secondary | ICD-10-CM | POA: Diagnosis not present

## 2015-06-30 DIAGNOSIS — M7072 Other bursitis of hip, left hip: Secondary | ICD-10-CM | POA: Diagnosis not present

## 2015-06-30 HISTORY — DX: Headache: R51

## 2015-06-30 HISTORY — DX: Headache, unspecified: R51.9

## 2015-06-30 HISTORY — DX: Other bursitis of hip, left hip: M70.72

## 2015-06-30 HISTORY — DX: Cardiac murmur, unspecified: R01.1

## 2015-06-30 LAB — CBC
HEMATOCRIT: 48.2 % — AB (ref 36.0–46.0)
Hemoglobin: 15.6 g/dL — ABNORMAL HIGH (ref 12.0–15.0)
MCH: 30.5 pg (ref 26.0–34.0)
MCHC: 32.4 g/dL (ref 30.0–36.0)
MCV: 94.3 fL (ref 78.0–100.0)
PLATELETS: 306 10*3/uL (ref 150–400)
RBC: 5.11 MIL/uL (ref 3.87–5.11)
RDW: 13.1 % (ref 11.5–15.5)
WBC: 7 10*3/uL (ref 4.0–10.5)

## 2015-06-30 LAB — BASIC METABOLIC PANEL
Anion gap: 8 (ref 5–15)
BUN: 16 mg/dL (ref 6–20)
CALCIUM: 10.1 mg/dL (ref 8.9–10.3)
CO2: 32 mmol/L (ref 22–32)
CREATININE: 0.98 mg/dL (ref 0.44–1.00)
Chloride: 105 mmol/L (ref 101–111)
GFR calc Af Amer: >60 mL/min (ref >60)
GFR calc non Af Amer: 59 mL/min — ABNORMAL LOW (ref >60)
GLUCOSE: 118 mg/dL — AB (ref 65–99)
Potassium: 4.9 mmol/L (ref 3.5–5.1)
Sodium: 145 mmol/L (ref 135–145)

## 2015-07-03 DIAGNOSIS — M7062 Trochanteric bursitis, left hip: Secondary | ICD-10-CM | POA: Diagnosis present

## 2015-07-03 NOTE — H&P (Signed)
CC- [name] is a 64 y.o. female who presents with left hip pain  Hip Pain: Patient complains of left hip pain. Onset of the symptoms was several months ago. Inciting event: none. Current symptoms include lateral hip pain. Associated symptoms: none. Aggravating symptoms: lying on left side. Evaluation to date: MRI with gluteal tendon tear.  Treatment to date: cortisone injection with slight short term improvement. She also has an arthritic right knee and requests cortisone injection  Past Medical History  Diagnosis Date  . Hyperlipidemia     on statin  . Depression   . Insomnia   . Seasonal allergies   . Arthritis     knees, hands  . Irritable bowel syndrome     s/p subtotal colectomy '86>diarrhea prone  . NEOPLASM, MALIGNANT, BASAL CELL, CARCINOMA, HX OF   . Heart murmur      MVP 30 yrs ago - no treatment needed  . Headache     HX MIGRAINES   . Bursitis of left hip   . GERD (gastroesophageal reflux disease)     INFREQUENT  . Cancer ([place])     hx skin cancer  . Hypertension     "flucuations" has never been on any med    Past Surgical History  Procedure Laterality Date  . Bunionectomy      right  and left foot  . Cervical spine surgery  12/18/1999    C5/6 and C6/7  . Breast enhancement surgery  1983  . Breast implant removal  08/2010  . Lasik  2009     bilateral  . Back surgery  1999    lower back  . Colectomy  1986  . Knee arthroscopy      right x 2  . Thumb surgery    . Left foot surgery      removed foreign object  . Abdominoplasty    . Eyelid surgery    . Hysteroscopy w/d&c  08/17/2011    Procedure: DILATATION AND CURETTAGE (D&C) /HYSTEROSCOPY;  Surgeon: Margarette Asal;  Location: [facility];  Service: Gynecology;  Laterality: N/A;  . Knee reconstruction, medial patellar femoral ligament      right  . Breast surgery    . Cosmetic surgery    . Eye surgery    . Fracture surgery    . Tubal ligation      Prior to Admission medications   Medication Sig  Start Date End Date Taking? Authorizing Provider  acetaminophen (TYLENOL) 500 MG tablet Take 1,000 mg by mouth 3 (three) times daily. 2 or 3 tablets as needed for headache or pain   Yes Historical Provider, MD  atorvastatin (LIPITOR) 20 MG tablet Take 1 tablet (20 mg total) by mouth daily. Patient taking differently: Take 20 mg by mouth every morning.  11/18/14  Yes Hoyt Koch, MD  buPROPion (WELLBUTRIN XL) 300 MG 24 hr tablet Take 1 tablet (300 mg total) by mouth daily. 11/18/14  Yes Hoyt Koch, MD  CALCIUM-VITAMIN D PO Take 500 Units by mouth daily.    Yes Historical Provider, MD  cholecalciferol (VITAMIN D) 1000 UNITS tablet Take 2,000 Units by mouth daily.    Yes Historical Provider, MD  DiphenhydrAMINE HCl, Sleep, ([facility] SLEEP AID PO) Take 1 tablet by mouth at bedtime.   Yes Historical Provider, MD  diphenoxylate-atropine (LOMOTIL) 2.5-0.025 MG per tablet Take 1 tablet by mouth 4 (four) times daily as needed for diarrhea or loose stools.   Yes Historical Provider, MD  furosemide (LASIX) 40 MG tablet Take 1 tablet (40 mg total) by mouth daily. 11/18/14  Yes Hoyt Koch, MD  Glucosamine-Chondroitin-MSM [phone] MG TABS Take 1 tablet by mouth daily. Triflex supplement    Yes Historical Provider, MD  HYDROcodone-acetaminophen (LORCET) 10-650 MG per tablet Take 0.5 tablets by mouth every 6 (six) hours as needed for pain.    Yes Historical Provider, MD  INFLUENZA VAC SPLIT HIGH-DOSE IM Inject into the muscle.   Yes Historical Provider, MD  loratadine (CLARITIN) 10 MG tablet Take 10 mg by mouth daily.    Yes Historical Provider, MD  mometasone (NASONEX) 50 MCG/ACT nasal spray Place 2 sprays into the nose daily as needed (congestion/allergies.).    Yes Historical Provider, MD  OVER THE COUNTER MEDICATION Apply 4 patches topically 2 (two) times daily. Salon pos patches on hip.   Yes Historical Provider, MD  PRESCRIPTION MEDICATION Inject 1 each into the skin. Cortisone  injections as needed in hip and knee.   Yes Historical Provider, MD  promethazine (PHENERGAN) 25 MG tablet Take 1 tablet (25 mg total) by mouth every 6 (six) hours as needed for nausea or vomiting. Patient taking differently: Take 0.5 mg by mouth every 6 (six) hours as needed for nausea (migraines).  11/18/14  Yes Hoyt Koch, MD  Simethicone (PHAZYME PO) Take 1 tablet by mouth every morning.    Yes Historical Provider, MD  chlorpheniramine-HYDROcodone (TUSSIONEX PENNKINETIC ER) 10-8 MG/5ML LQCR Take 5 mLs by mouth at bedtime as needed. 06/18/14   Elby Beck, FNP  ciprofloxacin (CIPRO) 500 MG tablet Take 1 tablet (500 mg total) by mouth 2 (two) times daily. Patient not taking: Reported on 06/27/2015 06/09/15   Robyn Haber, MD  fluorouracil (EFUDEX) 5 % cream Apply 1 application topically daily. 3 weeks then off for 1-3 weeks then resume for 3 weeks." For skin cancers" 05/31/15   Historical Provider, MD    Physical Examination: General appearance - alert, well appearing, and in no distress Mental status - alert, oriented to person, place, and time Chest - clear to auscultation, no wheezes, rales or rhonchi, symmetric air entry Heart - normal rate, regular rhythm, normal S1, S2, no murmurs, rubs, clicks or gallops Abdomen - soft, nontender, nondistended, no masses or organomegaly Neurological - alert, oriented, normal speech, no focal findings or movement disorder noted  A left hip exam was performed. GENERAL: no acute distress SKIN: intact SWELLING: none WARMTH: no warmth TENDERNESS: maximal at greater trochanter ROM: normal STRENGTH: normal  Right knee with no effusion. Moderate crepitus on range of motion. Tender medially  ASSESSMENT: Left hip bursitis with gluteal tendon tear. 2. Osteoarthritis right knee  Plan: Left hip bursectomy and gluteal tendon repair. Discussed in detail with patient who elects to proceed. Also will perform right knee cortisone injection for  symptomatic osteoarthritis at the request of the patient  [name]. Charman Blasco, MD    07/03/2015, 3:15 PM

## 2015-07-04 ENCOUNTER — Ambulatory Visit (HOSPITAL_COMMUNITY): Payer: Medicare Other | Admitting: Anesthesiology

## 2015-07-04 ENCOUNTER — Ambulatory Visit (HOSPITAL_COMMUNITY)
Admission: RE | Admit: 2015-07-04 | Discharge: 2015-07-04 | Disposition: A | Discharge status: Home / Self Care | Payer: Medicare Other | Source: Ambulatory Visit | Attending: Orthopedic Surgery | Admitting: Orthopedic Surgery

## 2015-07-04 ENCOUNTER — Encounter (HOSPITAL_COMMUNITY)
Admission: RE | Disposition: A | Discharge status: Home / Self Care | Payer: Self-pay | Source: Ambulatory Visit | Attending: Orthopedic Surgery

## 2015-07-04 ENCOUNTER — Encounter (HOSPITAL_COMMUNITY): Payer: Self-pay | Admitting: *Deleted

## 2015-07-04 DIAGNOSIS — M179 Osteoarthritis of knee, unspecified: Secondary | ICD-10-CM | POA: Diagnosis not present

## 2015-07-04 DIAGNOSIS — M7072 Other bursitis of hip, left hip: Secondary | ICD-10-CM | POA: Insufficient documentation

## 2015-07-04 DIAGNOSIS — M66852 Spontaneous rupture of other tendons, left thigh: Secondary | ICD-10-CM | POA: Insufficient documentation

## 2015-07-04 DIAGNOSIS — Z79899 Other long term (current) drug therapy: Secondary | ICD-10-CM | POA: Diagnosis not present

## 2015-07-04 DIAGNOSIS — F329 Major depressive disorder, single episode, unspecified: Secondary | ICD-10-CM | POA: Diagnosis not present

## 2015-07-04 DIAGNOSIS — K219 Gastro-esophageal reflux disease without esophagitis: Secondary | ICD-10-CM | POA: Insufficient documentation

## 2015-07-04 DIAGNOSIS — M1711 Unilateral primary osteoarthritis, right knee: Secondary | ICD-10-CM | POA: Diagnosis not present

## 2015-07-04 DIAGNOSIS — E785 Hyperlipidemia, unspecified: Secondary | ICD-10-CM | POA: Diagnosis not present

## 2015-07-04 DIAGNOSIS — M7062 Trochanteric bursitis, left hip: Secondary | ICD-10-CM | POA: Diagnosis not present

## 2015-07-04 HISTORY — PX: EXCISION/RELEASE BURSA HIP: SHX5014

## 2015-07-04 HISTORY — PX: INJECTION KNEE: SHX2446

## 2015-07-04 HISTORY — PX: OPEN SURGICAL REPAIR OF GLUTEAL TENDON: SHX5995

## 2015-07-04 SURGERY — RELEASE, BURSA, TROCHANTERIC
Anesthesia: General | Site: Knee | Laterality: Right

## 2015-07-04 MED ORDER — ROCURONIUM BROMIDE 100 MG/10ML IV SOLN
INTRAVENOUS | Status: DC | PRN
  Administered 2015-07-04: 30 mg via INTRAVENOUS

## 2015-07-04 MED ORDER — SODIUM CHLORIDE 0.9 % IJ SOLN
INTRAMUSCULAR | Status: AC
  Filled 2015-07-04: qty 10

## 2015-07-04 MED ORDER — SODIUM CHLORIDE 0.9 % IJ SOLN
INTRAMUSCULAR | Status: DC | PRN
  Administered 2015-07-04: 30 mL

## 2015-07-04 MED ORDER — CEFAZOLIN SODIUM-DEXTROSE 2-3 GM-% IV SOLR
2.0000 g | INTRAVENOUS | Status: AC
  Administered 2015-07-04: 2 g via INTRAVENOUS

## 2015-07-04 MED ORDER — PROPOFOL 10 MG/ML IV BOLUS
INTRAVENOUS | Status: AC
  Filled 2015-07-04: qty 20

## 2015-07-04 MED ORDER — PROMETHAZINE HCL 25 MG/ML IJ SOLN: 6.2500 mg | INTRAMUSCULAR | Status: DC | PRN

## 2015-07-04 MED ORDER — PROMETHAZINE HCL 25 MG/ML IJ SOLN
INTRAMUSCULAR | Status: AC
  Filled 2015-07-04: qty 1

## 2015-07-04 MED ORDER — MIDAZOLAM HCL 5 MG/5ML IJ SOLN
INTRAMUSCULAR | Status: DC | PRN
  Administered 2015-07-04: 2 mg via INTRAVENOUS

## 2015-07-04 MED ORDER — ACETAMINOPHEN 10 MG/ML IV SOLN
1000.0000 mg | Freq: Once | INTRAVENOUS | Status: AC
  Administered 2015-07-04: 1000 mg via INTRAVENOUS
  Filled 2015-07-04: qty 100

## 2015-07-04 MED ORDER — PROMETHAZINE HCL 25 MG PO TABS: 0.5000 mg | ORAL_TABLET | Freq: Four times a day (QID) | ORAL | Status: DC | PRN

## 2015-07-04 MED ORDER — METHYLPREDNISOLONE ACETATE 80 MG/ML IJ SUSP
INTRAMUSCULAR | Status: DC | PRN
  Administered 2015-07-04: 80 mg

## 2015-07-04 MED ORDER — ACETAMINOPHEN 10 MG/ML IV SOLN
INTRAVENOUS | Status: AC
  Filled 2015-07-04: qty 100

## 2015-07-04 MED ORDER — DEXAMETHASONE SODIUM PHOSPHATE 10 MG/ML IJ SOLN
10.0000 mg | Freq: Once | INTRAMUSCULAR | Status: AC
  Administered 2015-07-04: 10 mg via INTRAVENOUS

## 2015-07-04 MED ORDER — METHOCARBAMOL 500 MG PO TABS: 500.0000 mg | ORAL_TABLET | Freq: Four times a day (QID) | ORAL | Status: DC

## 2015-07-04 MED ORDER — FENTANYL CITRATE (PF) 100 MCG/2ML IJ SOLN
INTRAMUSCULAR | Status: AC
  Filled 2015-07-04: qty 2

## 2015-07-04 MED ORDER — SODIUM CHLORIDE 0.9 % IV SOLN: INTRAVENOUS | Status: DC

## 2015-07-04 MED ORDER — MIDAZOLAM HCL 2 MG/2ML IJ SOLN
INTRAMUSCULAR | Status: AC
  Filled 2015-07-04: qty 4

## 2015-07-04 MED ORDER — FENTANYL CITRATE (PF) 100 MCG/2ML IJ SOLN
25.0000 ug | INTRAMUSCULAR | Status: DC | PRN
  Administered 2015-07-04 (×3): 25 ug via INTRAVENOUS
  Filled 2015-07-04: qty 2

## 2015-07-04 MED ORDER — HYDROMORPHONE HCL 1 MG/ML IJ SOLN
0.2500 mg | INTRAMUSCULAR | Status: DC | PRN
  Administered 2015-07-04 (×4): 0.5 mg via INTRAVENOUS

## 2015-07-04 MED ORDER — HYDROCODONE-ACETAMINOPHEN 5-325 MG PO TABS: 1.0000 | ORAL_TABLET | Freq: Four times a day (QID) | ORAL | Status: DC | PRN

## 2015-07-04 MED ORDER — HYDROMORPHONE HCL 1 MG/ML IJ SOLN
INTRAMUSCULAR | Status: DC
Start: 2015-07-04 — End: 2015-07-04
  Filled 2015-07-04: qty 1

## 2015-07-04 MED ORDER — LIDOCAINE HCL (CARDIAC) 20 MG/ML IV SOLN
INTRAVENOUS | Status: DC | PRN
Start: 2015-07-04 — End: 2015-07-04
  Administered 2015-07-04: 50 mg via INTRAVENOUS

## 2015-07-04 MED ORDER — BUPIVACAINE HCL 0.25 % IJ SOLN: INTRAMUSCULAR | Status: DC | PRN

## 2015-07-04 MED ORDER — PROMETHAZINE HCL 25 MG/ML IJ SOLN
6.2500 mg | INTRAMUSCULAR | Status: DC | PRN
  Administered 2015-07-04: 6.25 mg via INTRAVENOUS

## 2015-07-04 MED ORDER — CEFAZOLIN SODIUM-DEXTROSE 2-3 GM-% IV SOLR
INTRAVENOUS | Status: AC
  Filled 2015-07-04: qty 50

## 2015-07-04 MED ORDER — CHLORHEXIDINE GLUCONATE 4 % EX LIQD: 60.0000 mL | Freq: Once | CUTANEOUS | Status: DC

## 2015-07-04 MED ORDER — LACTATED RINGERS IV SOLN
INTRAVENOUS | Status: DC
  Administered 2015-07-04: 1000 mL via INTRAVENOUS
  Administered 2015-07-04: 15:00:00 via INTRAVENOUS

## 2015-07-04 MED ORDER — PROPOFOL 10 MG/ML IV BOLUS
INTRAVENOUS | Status: DC | PRN
  Administered 2015-07-04: 150 mg via INTRAVENOUS

## 2015-07-04 MED ORDER — HYDROMORPHONE HCL 1 MG/ML IJ SOLN
INTRAMUSCULAR | Status: AC
  Filled 2015-07-04: qty 1

## 2015-07-04 MED ORDER — BUPIVACAINE HCL (PF) 0.25 % IJ SOLN
INTRAMUSCULAR | Status: DC | PRN
  Administered 2015-07-04: 30 mL

## 2015-07-04 MED ORDER — METHYLPREDNISOLONE ACETATE 40 MG/ML IJ SUSP
INTRAMUSCULAR | Status: AC
  Filled 2015-07-04: qty 2

## 2015-07-04 MED ORDER — SODIUM CHLORIDE 0.9 % IJ SOLN
INTRAMUSCULAR | Status: AC
  Filled 2015-07-04: qty 50

## 2015-07-04 MED ORDER — NEOSTIGMINE METHYLSULFATE 10 MG/10ML IV SOLN
INTRAVENOUS | Status: DC | PRN
  Administered 2015-07-04: 2 mg via INTRAVENOUS

## 2015-07-04 MED ORDER — BUPIVACAINE HCL (PF) 0.25 % IJ SOLN
INTRAMUSCULAR | Status: AC
  Filled 2015-07-04: qty 30

## 2015-07-04 MED ORDER — ATORVASTATIN CALCIUM 20 MG PO TABS: 20.0000 mg | ORAL_TABLET | Freq: Every morning | ORAL | Status: DC

## 2015-07-04 MED ORDER — FENTANYL CITRATE (PF) 250 MCG/5ML IJ SOLN
INTRAMUSCULAR | Status: AC
  Filled 2015-07-04: qty 25

## 2015-07-04 MED ORDER — ONDANSETRON HCL 4 MG/2ML IJ SOLN
INTRAMUSCULAR | Status: DC | PRN
  Administered 2015-07-04: 4 mg via INTRAVENOUS

## 2015-07-04 MED ORDER — PHENYLEPHRINE HCL 10 MG/ML IJ SOLN
INTRAMUSCULAR | Status: DC | PRN
  Administered 2015-07-04 (×3): 40 ug via INTRAVENOUS

## 2015-07-04 MED ORDER — HYDROCODONE-ACETAMINOPHEN 5-325 MG PO TABS
1.0000 | ORAL_TABLET | Freq: Four times a day (QID) | ORAL | Status: DC | PRN
  Filled 2015-07-04: qty 1

## 2015-07-04 MED ORDER — FENTANYL CITRATE (PF) 100 MCG/2ML IJ SOLN
INTRAMUSCULAR | Status: DC | PRN
  Administered 2015-07-04: 50 ug via INTRAVENOUS
  Administered 2015-07-04: 100 ug via INTRAVENOUS

## 2015-07-04 MED ORDER — BUPIVACAINE LIPOSOME 1.3 % IJ SUSP
20.0000 mL | Freq: Once | INTRAMUSCULAR | Status: DC
  Filled 2015-07-04: qty 20

## 2015-07-04 MED ORDER — HYDROMORPHONE HCL 1 MG/ML IJ SOLN: 0.2500 mg | INTRAMUSCULAR | Status: DC | PRN

## 2015-07-04 MED ORDER — BUPIVACAINE LIPOSOME 1.3 % IJ SUSP
INTRAMUSCULAR | Status: DC | PRN
  Administered 2015-07-04: 20 mL

## 2015-07-04 MED ORDER — SUCCINYLCHOLINE CHLORIDE 20 MG/ML IJ SOLN
INTRAMUSCULAR | Status: DC | PRN
  Administered 2015-07-04: 80 mg via INTRAVENOUS

## 2015-07-04 MED ORDER — GLYCOPYRROLATE 0.2 MG/ML IJ SOLN
INTRAMUSCULAR | Status: DC | PRN
  Administered 2015-07-04: 0.3 mg via INTRAVENOUS

## 2015-07-04 SURGICAL SUPPLY — 43 items
ANCHOR SUPER QUICK (Anchor) ×3 IMPLANT
BAG ZIPLOCK 12X15 (MISCELLANEOUS) IMPLANT
BIT DRILL 2.4X128 (BIT) IMPLANT
BLADE EXTENDED COATED 6.5IN (ELECTRODE) ×3 IMPLANT
DRAPE INCISE IOBAN 66X45 STRL (DRAPES) ×3 IMPLANT
DRAPE ORTHO SPLIT 77X108 STRL (DRAPES) ×2
DRAPE POUCH INSTRU U-SHP 10X18 (DRAPES) ×3 IMPLANT
DRAPE SURG ORHT 6 SPLT 77X108 (DRAPES) ×4 IMPLANT
DRAPE U-SHAPE 47X51 STRL (DRAPES) ×3 IMPLANT
DRSG ADAPTIC 3X8 NADH LF (GAUZE/BANDAGES/DRESSINGS) IMPLANT
DRSG AQUACEL AG ADV 3.5X 6 (GAUZE/BANDAGES/DRESSINGS) ×3 IMPLANT
DRSG MEPILEX BORDER 4X4 (GAUZE/BANDAGES/DRESSINGS) IMPLANT
DRSG MEPILEX BORDER 4X8 (GAUZE/BANDAGES/DRESSINGS) IMPLANT
DURAPREP 26ML APPLICATOR (WOUND CARE) ×3 IMPLANT
ELECT REM PT RETURN 9FT ADLT (ELECTROSURGICAL) ×3
ELECTRODE REM PT RTRN 9FT ADLT (ELECTROSURGICAL) ×2 IMPLANT
GAUZE SPONGE 4X4 12PLY STRL (GAUZE/BANDAGES/DRESSINGS) ×3 IMPLANT
GLOVE BIO SURGEON STRL SZ7.5 (GLOVE) ×15 IMPLANT
GLOVE BIO SURGEON STRL SZ8 (GLOVE) ×3 IMPLANT
GLOVE BIOGEL PI IND STRL 8 (GLOVE) ×4 IMPLANT
GLOVE BIOGEL PI INDICATOR 8 (GLOVE) ×2
GOWN STRL REUS W/TWL LRG LVL3 (GOWN DISPOSABLE) ×6 IMPLANT
GOWN STRL REUS W/TWL XL LVL3 (GOWN DISPOSABLE) ×3 IMPLANT
KIT BASIN OR (CUSTOM PROCEDURE TRAY) ×3 IMPLANT
MANIFOLD NEPTUNE II (INSTRUMENTS) ×3 IMPLANT
NDL SAFETY ECLIPSE 18X1.5 (NEEDLE) ×4 IMPLANT
NEEDLE HYPO 18GX1.5 SHARP (NEEDLE) ×2
NS IRRIG 1000ML POUR BTL (IV SOLUTION) ×3 IMPLANT
PACK TOTAL JOINT (CUSTOM PROCEDURE TRAY) ×3 IMPLANT
PASSER SUT SWANSON 36MM LOOP (INSTRUMENTS) IMPLANT
POSITIONER SURGICAL ARM (MISCELLANEOUS) ×3 IMPLANT
STAPLER VISISTAT 35W (STAPLE) IMPLANT
STRIP CLOSURE SKIN 1/2X4 (GAUZE/BANDAGES/DRESSINGS) ×6 IMPLANT
SUT ETHIBOND NAB CT1 #1 30IN (SUTURE) IMPLANT
SUT MNCRL AB 4-0 PS2 18 (SUTURE) ×3 IMPLANT
SUT VIC AB 1 CT1 27 (SUTURE) ×2
SUT VIC AB 1 CT1 27XBRD ANTBC (SUTURE) ×4 IMPLANT
SUT VIC AB 2-0 CT1 27 (SUTURE) ×2
SUT VIC AB 2-0 CT1 TAPERPNT 27 (SUTURE) ×4 IMPLANT
SUT VLOC 180 0 24IN GS25 (SUTURE) IMPLANT
SYR 20CC LL (SYRINGE) ×3 IMPLANT
SYR 50ML LL SCALE MARK (SYRINGE) ×3 IMPLANT
TOWEL OR 17X26 10 PK STRL BLUE (TOWEL DISPOSABLE) ×6 IMPLANT

## 2015-07-04 NOTE — Anesthesia Preprocedure Evaluation (Signed)
Anesthesia Evaluation  Patient identified by MRN, date of birth, ID band Patient awake    Reviewed: Allergy & Precautions, NPO status , Patient's Chart, lab work & pertinent test results  Airway Mallampati: II  TM Distance: >3 FB Neck ROM: Full    Dental no notable dental hx.    Pulmonary neg pulmonary ROS,    Pulmonary exam normal breath sounds clear to auscultation       Cardiovascular Exercise Tolerance: Good hypertension, Pt. on medications Normal cardiovascular exam Rhythm:Regular Rate:Normal     Neuro/Psych  Headaches, PSYCHIATRIC DISORDERS Depression    GI/Hepatic Neg liver ROS, GERD  ,  Endo/Other  negative endocrine ROS  Renal/GU negative Renal ROS  negative genitourinary   Musculoskeletal  (+) Arthritis ,   Abdominal   Peds negative pediatric ROS (+)  Hematology negative hematology ROS (+)   Anesthesia Other Findings   Reproductive/Obstetrics negative OB ROS                             Anesthesia Physical Anesthesia Plan  ASA: II  Anesthesia Plan: General   Post-op Pain Management:    Induction: Intravenous  Airway Management Planned: Oral ETT  Additional Equipment:   Intra-op Plan:   Post-operative Plan: Extubation in OR  Informed Consent: I have reviewed the patients History and Physical, chart, labs and discussed the procedure including the risks, benefits and alternatives for the proposed anesthesia with the patient or authorized representative who has indicated his/her understanding and acceptance.   Dental advisory given  Plan Discussed with: CRNA  Anesthesia Plan Comments:         Anesthesia Quick Evaluation

## 2015-07-04 NOTE — Transfer of Care (Signed)
Immediate Anesthesia Transfer of Care Note  Patient: [name]  Procedure(s) Performed: Procedure(s): LEFT HIP BURSECTOMY WITH  (Left) GLUTEAL TENDON REPAIR  (Left) CORTISONE INJECTION RIGHT KNEE  (Right)  Patient Location: PACU  Anesthesia Type:General  Level of Consciousness: awake, alert  and oriented  Airway & Oxygen Therapy: Patient Spontanous Breathing and Patient connected to face mask oxygen  Post-op Assessment: Report given to RN and Post -op Vital signs reviewed and stable  Post vital signs: Reviewed and stable  Last Vitals:  Filed Vitals:   07/04/15 1112  BP: 138/78  Pulse: 76  Temp: 36.6 C  Resp: 16    Complications: No apparent anesthesia complications

## 2015-07-04 NOTE — Brief Op Note (Signed)
07/04/2015  2:38 PM  PATIENT:  [name]  64 y.o. female  PRE-OPERATIVE DIAGNOSIS: 1.  LEFT HIP BURSITIS with gluteal tendon tear 2. Osteoarthritis right knee  POST-OPERATIVE DIAGNOSIS: 1.  LEFT HIP BURSITIS with gluteal tendon tear 2. Osteoarthritis right knee  PROCEDURE:  Procedure(s): LEFT HIP BURSECTOMY WITH  (Left) GLUTEAL TENDON REPAIR  (Left) CORTISONE INJECTION RIGHT KNEE  (Right)  SURGEON:  Surgeon(s) and Role:    * Gaynelle Arabian, MD - Primary  PHYSICIAN ASSISTANT:   ASSISTANTS: Arlee Muslim, PA-C   ANESTHESIA:   general  EBL:   minimal  DRAINS: none   LOCAL MEDICATIONS USED:  OTHER [employer]  COUNTS:  YES  TOURNIQUET:  * No tourniquets in log *  DICTATION: .Other Dictation: Dictation Number [id number]  PLAN OF CARE: Discharge to home after PACU  PATIENT DISPOSITION:  PACU - hemodynamically stable.

## 2015-07-04 NOTE — Interval H&P Note (Signed)
History and Physical Interval Note:  07/04/2015 1:34 PM  [name]  has presented today for surgery, with the diagnosis of LEFT HIP BURSITIS  The various methods of treatment have been discussed with the patient and family. After consideration of risks, benefits and other options for treatment, the patient has consented to  Procedure(s): LEFT HIP BURSECTOMY WITH  (Left) GLUTEAL TENDON REPAIR  (Left) CORTISONE INJECTION RIGHT KNEE  (Right) as a surgical intervention .  The patient's history has been reviewed, patient examined, no change in status, stable for surgery.  I have reviewed the patient's chart and labs.  Questions were answered to the patient's satisfaction.     [name]

## 2015-07-04 NOTE — Op Note (Signed)
NAMEKENNEDI, Jessica NO.:  [id number]  MEDICAL RECORD NO.:  [id number]  LOCATION:  [facility]                         FACILITY:  [facility]  PHYSICIAN:  Gaynelle Arabian, M.D.    DATE OF BIRTH:  20-Aug-1951  DATE OF PROCEDURE:  07/04/2015 DATE OF DISCHARGE:  07/04/2015                              OPERATIVE REPORT   PREOPERATIVE DIAGNOSIS: 1. Left hip intractable bursitis with gluteal tendon tear. 2. Osteoarthritis, right knee.  POSTOPERATIVE DIAGNOSIS: 1. Left hip intractable bursitis with gluteal tendon tear. 2. Osteoarthritis, right knee.  PROCEDURE: 1. Left hip bursectomy with gluteal tendon repair. 2. An intra-articular steroid injection, right knee.  SURGEON:  Gaynelle Arabian, M.D.  ASSISTANT:  Alexzandrew L. Perkins, P.A.C.  ANESTHESIA:  General.  ESTIMATED BLOOD LOSS:  Minimal.  DRAINS:  None.  COMPLICATIONS:  None.  CONDITION:  Stable to recovery.  BRIEF CLINICAL NOTE:  Ms. Pogorzelski is a 63 year old female, long history of left lateral hip pain which has been treated with injections. Unfortunately, the injections are no longer beneficial, and she has also had therapy with this.  The hip has gotten progressively worse.  An MRI showed a gluteal tendon tear.  She presents now for bursectomy and tendon repair.  In addition, she also has symptomatic osteoarthritis of the right knee and has had cortisone injections in the past.  The knee is bothering her again, and she requested a cortisone injection to the right knee.  PROCEDURE IN DETAIL:  After successful administration of general anesthetic, I prepped her right knee with Betadine and injected the right knee with 80 mg of Depo-Medrol with no problems.  A Band-Aid is placed.  We then placed her in the right lateral decubitus position with the left side up and held there with a hip positioner.  The left lower extremity was isolated from her perineum with plastic drapes and prepped and draped in the usual  sterile fashion.  About a 4-5 inch incision was made centered over the tip of the greater trochanter.  The skin was cut with a 10 blade through subcutaneous tissues to the level of the fascia lata which was incised in line with the skin incision.  There was immediate evidence of a large thickened calcified bursa.  The bursa was removed and meticulous hemostasis was achieved.  I then palpated around the edges and tip of the greater trochanter at the junction of the central and posterior thirds of the tendon there is a tear.  We placed a Mitek anchor into this portion of the greater trochanter and then passed the sutures through the free edge of the torn tendon.  I advanced it down to the bone.  It was over tied with the suture and then I sutured the repaired tendon back to the main tendon.  I then thoroughly irrigated the wound with saline solution.  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 A 20 mL of Exparel mixed with 30 mL of saline injected into gluteal muscles, fascia lata, subcu tissues.  Additional, 20 mL of 0.25% Marcaine injected into the same tissues.  The fascia lata was closed over the repair with interrupted #1 Vicryl.  Subcu was closed with #  1 and 2-0 Vicryl and subcuticular running 4-0 Monocryl.  Incisions cleaned and dried and Steri-Strips and a sterile dressing applied.  She was then awakened and transported to recovery in stable condition.  Note that a surgical assistant was medical necessity for appropriate retraction of the vital structures and for full exposure of the greater trochanter to remove the bursa as well as repair of the tendon.     Gaynelle Arabian, M.D.     FA/MEDQ  D:  07/04/2015  T:  07/04/2015  Job:  [id number]

## 2015-07-04 NOTE — Discharge Instructions (Signed)
Dr. Gaynelle Arabian Total Joint Specialist [facility] [address], [place], [place] [address] [phone]  Bursectomy / Gluteal Tendon Repair Discharge Instructions  HOME CARE INSTRUCTIONS  Remove items at home which could result in a fall. This includes throw rugs or furniture in walking pathways.   ICE to the affected hip every three hours for 30 minutes at a time and then as needed for pain and swelling.  Continue to use ice on the hip for pain and swelling from surgery. You may notice swelling that will progress down to the foot and ankle.  This is normal after surgery.  Elevate the leg when you are not up walking on it.    Continue to use the breathing machine which will help keep your temperature down.  It is common for your temperature to cycle up and down following surgery, especially at night when you are not up moving around and exerting yourself.  The breathing machine keeps your lungs expanded and your temperature down. Sit on high chairs which makes it easier to stand.  Sit on chairs with arms. Use the chair arms to help push yourself up when arising.   No Active Abduction of the leg (No pulling leg out to the side away from the body).  Use your walker for first several days until comfortable ambulating.  DIET You may resume your previous home diet once your are discharged from the hospital.  DRESSING / WOUND CARE / SHOWERING You may start showering two days following surgery but do not submerge the incision under water.Just pat the incision dry and apply a dry gauze dressing on daily. Change the surgical dressing daily and reapply a dry dressing each time.  ACTIVITY Walk with your walker as instructed. Use walker as long as suggested by your caregivers. Avoid periods of inactivity such as sitting longer than an hour when not asleep. This helps prevent blood clots.  You may resume a sexual relationship in one month or when given the OK by  your doctor.  You may return to work once you are cleared by your doctor.  Do not drive a car for 6 weeks or until released by you surgeon.  Do not drive while taking narcotics.  WEIGHT BEARING AS TOLERATED DO NOT KICK YOUR LEFT LEG OUT TO THE SIDE FOR 6 WEEKS  POSTOPERATIVE CONSTIPATION PROTOCOL Constipation - defined medically as fewer than three stools per week and severe constipation as less than one stool per week.  One of the most common issues patients have following surgery is constipation.  Even if you have a regular bowel pattern at home, your normal regimen is likely to be disrupted due to multiple reasons following surgery.  Combination of anesthesia, postoperative narcotics, change in appetite and fluid intake all can affect your bowels.  In order to avoid complications following surgery, here are some recommendations in order to help you during your recovery period.  Colace (docusate) - Pick up an over-the-counter form of Colace or another stool softener and take twice a day as long as you are requiring postoperative pain medications.  Take with a full glass of water daily.  If you experience loose stools or diarrhea, hold the colace until you stool forms back up.  If your symptoms do not get better within 1 week or if they get worse, check with your doctor.  Dulcolax (bisacodyl) - Pick up over-the-counter and take as directed by the product packaging as needed to assist with the  movement of your bowels.  Take with a full glass of water.  Use this product as needed if not relieved by Colace only.   MiraLax (polyethylene glycol) - Pick up over-the-counter to have on hand.  MiraLax is a solution that will increase the amount of water in your bowels to assist with bowel movements.  Take as directed and can mix with a glass of water, juice, soda, coffee, or tea.  Take if you go more than two days without a movement. Do not use MiraLax more than once per day. Call your doctor if you are  still constipated or irregular after using this medication for 7 days in a row.  If you continue to have problems with postoperative constipation, please contact the office for further assistance and recommendations.  If you experience "the worst abdominal pain ever" or develop nausea or vomiting, please contact the office immediatly for further recommendations for treatment.  ITCHING  If you experience itching with your medications, try taking only a single pain pill, or even half a pain pill at a time.  You can also use Benadryl over the counter for itching or also to help with sleep.   TED HOSE STOCKINGS Wear the elastic stockings on both legs for three weeks following surgery during the day but you may remove then at night for sleeping.  MEDICATIONS See your medication summary on the After Visit Summary that the nursing staff will review with you prior to discharge.  You may have some home medications which will be placed on hold until you complete the course of blood thinner medication.  It is important for you to complete the blood thinner medication as prescribed by your surgeon.  Continue your approved medications as instructed at time of discharge.  PRECAUTIONS If you experience chest pain or shortness of breath - call 911 immediately for transfer to the hospital emergency department.  If you develop a fever greater that 101 F, purulent drainage from wound, increased redness or drainage from wound, foul odor from the wound/dressing, or calf pain - CONTACT YOUR SURGEON.                                                   FOLLOW-UP APPOINTMENTS Make sure you keep all of your appointments after your operation with your surgeon and caregivers. You should call the office at the above phone number and make an appointment for approximately two weeks after the date of your surgery or on the date instructed by your surgeon outlined in the "After Visit Summary".   Pick up stool softner and  laxative for home use following surgery while on pain medications. Do not submerge incision under water. Please use good hand washing techniques while changing dressing each day. May shower starting twodays after surgery. Please use a clean towel to pat the dressing dry following showers. Continue to use ice for pain and swelling after surgery. Do not use any lotions or creams on the incision until instructed by your surgeon.

## 2015-07-04 NOTE — Anesthesia Procedure Notes (Addendum)
Procedure Name: Intubation Date/Time: 07/04/2015 1:44 PM Performed by: Anne Fu Pre-anesthesia Checklist: Patient identified, Emergency Drugs available, Suction available, Patient being monitored and Timeout performed Patient Re-evaluated:Patient Re-evaluated prior to inductionOxygen Delivery Method: Circle system utilized Preoxygenation: Pre-oxygenation with 100% oxygen Intubation Type: IV induction Ventilation: Mask ventilation without difficulty Laryngoscope Size: Mac and 4 Grade View: Grade I Tube type: Oral Tube size: 7.5 mm Number of attempts: 1 Airway Equipment and Method: Video-laryngoscopy (Patient with Hx of migraine headache if her head is tilted to high up, video scope used to maintain neutral position. ) Placement Confirmation: ETT inserted through vocal cords under direct vision,  positive ETCO2,  CO2 detector and breath sounds checked- equal and bilateral Secured at: 21 cm Tube secured with: Tape Dental Injury: Teeth and Oropharynx as per pre-operative assessment

## 2015-07-04 NOTE — Anesthesia Postprocedure Evaluation (Signed)
  Anesthesia Post-op Note  Patient: [name]  Procedure(s) Performed: Procedure(s) (LRB): LEFT HIP BURSECTOMY WITH  (Left) GLUTEAL TENDON REPAIR  (Left) CORTISONE INJECTION RIGHT KNEE  (Right)  Patient Location: PACU  Anesthesia Type: General  Level of Consciousness: awake and alert   Airway and Oxygen Therapy: Patient Spontanous Breathing  Post-op Pain: mild  Post-op Assessment: Post-op Vital signs reviewed, Patient's Cardiovascular Status Stable, Respiratory Function Stable, Patent Airway and No signs of Nausea or vomiting  Last Vitals:  Filed Vitals:   07/04/15 1657  BP: 132/71  Pulse: 75  Temp: 36.3 C  Resp: 16    Post-op Vital Signs: stable   Complications: No apparent anesthesia complications

## 2015-07-05 NOTE — Anesthesia Postprocedure Evaluation (Signed)
  Anesthesia Post-op Note  Patient: [name]  Procedure(s) Performed: Procedure(s) (LRB): LEFT HIP BURSECTOMY WITH  (Left) GLUTEAL TENDON REPAIR  (Left) CORTISONE INJECTION RIGHT KNEE  (Right)  Patient Location: PACU  Anesthesia Type: General  Level of Consciousness: awake and alert   Airway and Oxygen Therapy: Patient Spontanous Breathing  Post-op Pain: mild  Post-op Assessment: Post-op Vital signs reviewed, Patient's Cardiovascular Status Stable, Respiratory Function Stable, Patent Airway and No signs of Nausea or vomiting  Last Vitals:  Filed Vitals:   07/04/15 1800  BP: 132/70  Pulse: 82  Temp:   Resp: 20    Post-op Vital Signs: stable   Complications: No apparent anesthesia complications

## 2015-07-19 DIAGNOSIS — Z4789 Encounter for other orthopedic aftercare: Secondary | ICD-10-CM | POA: Diagnosis not present

## 2015-07-22 DIAGNOSIS — L905 Scar conditions and fibrosis of skin: Secondary | ICD-10-CM | POA: Diagnosis not present

## 2015-07-22 DIAGNOSIS — L570 Actinic keratosis: Secondary | ICD-10-CM | POA: Diagnosis not present

## 2015-07-22 DIAGNOSIS — C44729 Squamous cell carcinoma of skin of left lower limb, including hip: Secondary | ICD-10-CM | POA: Diagnosis not present

## 2015-07-28 ENCOUNTER — Other Ambulatory Visit: Payer: Self-pay

## 2015-07-28 DIAGNOSIS — Z1231 Encounter for screening mammogram for malignant neoplasm of breast: Secondary | ICD-10-CM

## 2015-08-08 ENCOUNTER — Encounter: Payer: Self-pay | Admitting: Internal Medicine

## 2015-08-16 DIAGNOSIS — Z4789 Encounter for other orthopedic aftercare: Secondary | ICD-10-CM | POA: Diagnosis not present

## 2015-08-18 DIAGNOSIS — Z01419 Encounter for gynecological examination (general) (routine) without abnormal findings: Secondary | ICD-10-CM | POA: Diagnosis not present

## 2015-08-18 DIAGNOSIS — Z124 Encounter for screening for malignant neoplasm of cervix: Secondary | ICD-10-CM | POA: Diagnosis not present

## 2015-08-18 DIAGNOSIS — Z6827 Body mass index (BMI) 27.0-27.9, adult: Secondary | ICD-10-CM | POA: Diagnosis not present

## 2015-09-14 ENCOUNTER — Ambulatory Visit (INDEPENDENT_AMBULATORY_CARE_PROVIDER_SITE_OTHER): Payer: Medicare Other | Admitting: Internal Medicine

## 2015-09-14 ENCOUNTER — Encounter: Payer: Self-pay | Admitting: Internal Medicine

## 2015-09-14 VITALS — BP 171/85 | HR 87 | Temp 98.7°F | Resp 16 | Ht 67.0 in | Wt 177.0 lb

## 2015-09-14 DIAGNOSIS — I10 Essential (primary) hypertension: Secondary | ICD-10-CM

## 2015-09-14 DIAGNOSIS — E785 Hyperlipidemia, unspecified: Secondary | ICD-10-CM

## 2015-09-14 DIAGNOSIS — Z0000 Encounter for general adult medical examination without abnormal findings: Secondary | ICD-10-CM | POA: Diagnosis not present

## 2015-09-14 DIAGNOSIS — F329 Major depressive disorder, single episode, unspecified: Secondary | ICD-10-CM

## 2015-09-14 DIAGNOSIS — Z23 Encounter for immunization: Secondary | ICD-10-CM | POA: Diagnosis not present

## 2015-09-14 LAB — CBC WITH DIFFERENTIAL/PLATELET
BASOS ABS: 0.1 10*3/uL (ref 0.0–0.1)
BASOS PCT: 1 % (ref 0–1)
Eosinophils Absolute: 0.2 10*3/uL (ref 0.0–0.7)
Eosinophils Relative: 3 % (ref 0–5)
HEMATOCRIT: 43.4 % (ref 36.0–46.0)
HEMOGLOBIN: 14.4 g/dL (ref 12.0–15.0)
LYMPHS PCT: 31 % (ref 12–46)
Lymphs Abs: 2 10*3/uL (ref 0.7–4.0)
MCH: 29.9 pg (ref 26.0–34.0)
MCHC: 33.2 g/dL (ref 30.0–36.0)
MCV: 90.2 fL (ref 78.0–100.0)
MPV: 10 fL (ref 8.6–12.4)
Monocytes Absolute: 0.6 10*3/uL (ref 0.1–1.0)
Monocytes Relative: 9 % (ref 3–12)
NEUTROS ABS: 3.5 10*3/uL (ref 1.7–7.7)
Neutrophils Relative %: 56 % (ref 43–77)
Platelets: 258 10*3/uL (ref 150–400)
RBC: 4.81 MIL/uL (ref 3.87–5.11)
RDW: 13.4 % (ref 11.5–15.5)
WBC: 6.3 10*3/uL (ref 4.0–10.5)

## 2015-09-14 LAB — LIPID PANEL
Cholesterol: 181 mg/dL (ref 125–200)
HDL: 59 mg/dL (ref >=46)
LDL CALC: 81 mg/dL (ref <130)
Total CHOL/HDL Ratio: 3.1 Ratio (ref <=5.0)
Triglycerides: 204 mg/dL — ABNORMAL HIGH (ref <150)
VLDL: 41 mg/dL — ABNORMAL HIGH (ref <30)

## 2015-09-14 LAB — COMPREHENSIVE METABOLIC PANEL
ALK PHOS: 72 U/L (ref 33–130)
ALT: 23 U/L (ref 6–29)
AST: 19 U/L (ref 10–35)
Albumin: 4.5 g/dL (ref 3.6–5.1)
BILIRUBIN TOTAL: 0.6 mg/dL (ref 0.2–1.2)
BUN: 12 mg/dL (ref 7–25)
CALCIUM: 9.6 mg/dL (ref 8.6–10.4)
CO2: 23 mmol/L (ref 20–31)
CREATININE: 0.89 mg/dL (ref 0.50–0.99)
Chloride: 104 mmol/L (ref 98–110)
GLUCOSE: 96 mg/dL (ref 65–99)
Potassium: 3.7 mmol/L (ref 3.5–5.3)
SODIUM: 138 mmol/L (ref 135–146)
Total Protein: 6.7 g/dL (ref 6.1–8.1)

## 2015-09-14 LAB — TSH: TSH: 2.478 u[IU]/mL (ref 0.350–4.500)

## 2015-09-14 MED ORDER — BUPROPION HCL ER (XL) 300 MG PO TB24: 300.0000 mg | ORAL_TABLET | Freq: Every day | ORAL | Status: DC

## 2015-09-14 MED ORDER — ATORVASTATIN CALCIUM 20 MG PO TABS: 20.0000 mg | ORAL_TABLET | Freq: Every morning | ORAL | Status: DC

## 2015-09-14 MED ORDER — FUROSEMIDE 40 MG PO TABS: 40.0000 mg | ORAL_TABLET | Freq: Every day | ORAL | Status: DC

## 2015-09-14 NOTE — Patient Instructions (Signed)
[name] [name]

## 2015-09-14 NOTE — Progress Notes (Signed)
Subjective:    Patient ID: [name], female    DOB: 1951-06-27, 65 y.o.   MRN: [id number]  HPIinitial OV w/ me--husb my patient//initial medicare welcome Was at [facility] but didn't like transition from [employer] in gen on meds for cho;les and htn--tho unclear if whitecoat Started lasix for knee swell p surgery and on it for BP ever since  [place]--happy in retir Lots of golf and travel  imm utd x prevnar [place] utd  meds--lasix for HTN wellbutr for depr--long term-ried to stop 3-4 y ago but to irritable  lipitor 20 for HL  Followed [facility] w/ sun relat skin Cas  Recent hip bursec Dr Alusio--awaiting knee repl but getting by w/ steroid injec  S/p br reduc doing well  Review of Systems 14pt ros neg per form x diarrhea rel to food and stress following colectomy for rec constip following 1st child at age 70--surg 10-15 yr later---uses lomotil prn She descibes some concern re memory but is also showing great distractabilty See asrs    Objective:   Physical Exam  Constitutional: She is oriented to person, place, and time. She appears well-developed and well-nourished. No distress.  HENT:  Head: Normocephalic.  Right Ear: External ear normal.  Left Ear: External ear normal.  Nose: Nose normal.  Mouth/Throat: Oropharynx is clear and moist.  Eyes: Conjunctivae and EOM are normal. Pupils are equal, round, and reactive to light.  Neck: Normal range of motion. Neck supple. No thyromegaly present.  Cardiovascular: Normal rate, regular rhythm, normal heart sounds and intact distal pulses.   No murmur heard. Pulmonary/Chest: Effort normal and breath sounds normal. She has no wheezes.  Abdominal: Soft. Bowel sounds are normal. She exhibits no distension and no mass. There is no tenderness. There is no rebound.  Musculoskeletal: Normal range of motion. She exhibits no edema or tenderness.  Lymphadenopathy:    She has no cervical adenopathy.  Neurological: She is  alert and oriented to person, place, and time. She has normal reflexes. No cranial nerve deficit.  Skin: Skin is warm and dry. No rash noted.  Psychiatric: She has a normal mood and affect. Her behavior is normal. Judgment and thought content normal.  Nursing note and vitals reviewed. BP 171/85 mmHg  Pulse 87  Temp(Src) 98.7 F (37.1 C)  Resp 16  Ht 5\' 7"  (1.702 m)  Wt 177 lb (80.287 kg)  BMI 27.72 kg/m2     Assessment & Plan:  Initial Medicare annual wellness visit - Plan: TSH due to fatigue(tho intermittent)  Hyperlipidemia - Plan: Lipid panel--? meds ok  Reactive depression--cont welbutr 300 for foreseeable futue  Essential hypertension - ? If accurate dx//and on lasix tho she is reluc to change --suggest home BP record to review  ?re memory--intact to me with high liklihood of undiag add--she declines further eval now but will consider  Meds ordered this encounter  Medications  . furosemide (LASIX) 40 MG tablet    Sig: Take 1 tablet (40 mg total) by mouth daily.    Dispense:  90 tablet    Refill:  3  . buPROPion (WELLBUTRIN XL) 300 MG 24 hr tablet    Sig: Take 1 tablet (300 mg total) by mouth daily.    Dispense:  90 tablet    Refill:  3  . atorvastatin (LIPITOR) 20 MG tablet    Sig: Take 1 tablet (20 mg total) by mouth every morning.    Dispense:  90 tablet  Refill:  3   Notify labs Needs vision f/u

## 2015-09-20 ENCOUNTER — Ambulatory Visit: Payer: Medicare Other

## 2015-09-21 ENCOUNTER — Ambulatory Visit
Admission: RE | Admit: 2015-09-21 | Discharge: 2015-09-21 | Disposition: A | Discharge status: Home / Self Care | Payer: Medicare Other | Source: Ambulatory Visit

## 2015-09-21 DIAGNOSIS — Z1231 Encounter for screening mammogram for malignant neoplasm of breast: Secondary | ICD-10-CM | POA: Diagnosis not present

## 2015-10-06 DIAGNOSIS — M7062 Trochanteric bursitis, left hip: Secondary | ICD-10-CM | POA: Diagnosis not present

## 2015-10-06 DIAGNOSIS — Z4789 Encounter for other orthopedic aftercare: Secondary | ICD-10-CM | POA: Diagnosis not present

## 2015-10-06 DIAGNOSIS — M1711 Unilateral primary osteoarthritis, right knee: Secondary | ICD-10-CM | POA: Diagnosis not present

## 2015-10-12 DIAGNOSIS — M5136 Other intervertebral disc degeneration, lumbar region: Secondary | ICD-10-CM | POA: Diagnosis not present

## 2015-10-12 DIAGNOSIS — M5442 Lumbago with sciatica, left side: Secondary | ICD-10-CM | POA: Diagnosis not present

## 2015-10-12 DIAGNOSIS — G8929 Other chronic pain: Secondary | ICD-10-CM | POA: Diagnosis not present

## 2015-10-19 DIAGNOSIS — M5136 Other intervertebral disc degeneration, lumbar region: Secondary | ICD-10-CM | POA: Diagnosis not present

## 2015-10-21 DIAGNOSIS — M1812 Unilateral primary osteoarthritis of first carpometacarpal joint, left hand: Secondary | ICD-10-CM | POA: Diagnosis not present

## 2015-11-07 DIAGNOSIS — M5442 Lumbago with sciatica, left side: Secondary | ICD-10-CM | POA: Diagnosis not present

## 2015-11-07 DIAGNOSIS — M5136 Other intervertebral disc degeneration, lumbar region: Secondary | ICD-10-CM | POA: Diagnosis not present

## 2015-11-07 DIAGNOSIS — G8929 Other chronic pain: Secondary | ICD-10-CM | POA: Diagnosis not present

## 2015-11-23 DIAGNOSIS — M5136 Other intervertebral disc degeneration, lumbar region: Secondary | ICD-10-CM | POA: Diagnosis not present

## 2015-12-08 DIAGNOSIS — M5136 Other intervertebral disc degeneration, lumbar region: Secondary | ICD-10-CM | POA: Diagnosis not present

## 2015-12-08 DIAGNOSIS — S63652D Sprain of metacarpophalangeal joint of right middle finger, subsequent encounter: Secondary | ICD-10-CM | POA: Diagnosis not present

## 2015-12-08 DIAGNOSIS — M5442 Lumbago with sciatica, left side: Secondary | ICD-10-CM | POA: Diagnosis not present

## 2015-12-08 DIAGNOSIS — G8929 Other chronic pain: Secondary | ICD-10-CM | POA: Diagnosis not present

## 2015-12-08 DIAGNOSIS — Z01812 Encounter for preprocedural laboratory examination: Secondary | ICD-10-CM | POA: Diagnosis not present

## 2015-12-08 DIAGNOSIS — M1812 Unilateral primary osteoarthritis of first carpometacarpal joint, left hand: Secondary | ICD-10-CM | POA: Diagnosis not present

## 2015-12-14 DIAGNOSIS — D485 Neoplasm of uncertain behavior of skin: Secondary | ICD-10-CM | POA: Diagnosis not present

## 2015-12-14 DIAGNOSIS — L814 Other melanin hyperpigmentation: Secondary | ICD-10-CM | POA: Diagnosis not present

## 2015-12-14 DIAGNOSIS — L821 Other seborrheic keratosis: Secondary | ICD-10-CM | POA: Diagnosis not present

## 2015-12-14 DIAGNOSIS — L570 Actinic keratosis: Secondary | ICD-10-CM | POA: Diagnosis not present

## 2015-12-14 DIAGNOSIS — Z86018 Personal history of other benign neoplasm: Secondary | ICD-10-CM | POA: Diagnosis not present

## 2015-12-14 DIAGNOSIS — D1801 Hemangioma of skin and subcutaneous tissue: Secondary | ICD-10-CM | POA: Diagnosis not present

## 2015-12-14 DIAGNOSIS — Z85828 Personal history of other malignant neoplasm of skin: Secondary | ICD-10-CM | POA: Diagnosis not present

## 2015-12-14 DIAGNOSIS — C44319 Basal cell carcinoma of skin of other parts of face: Secondary | ICD-10-CM | POA: Diagnosis not present

## 2015-12-14 DIAGNOSIS — D225 Melanocytic nevi of trunk: Secondary | ICD-10-CM | POA: Diagnosis not present

## 2015-12-19 DIAGNOSIS — M545 Low back pain: Secondary | ICD-10-CM | POA: Diagnosis not present

## 2015-12-29 DIAGNOSIS — G8929 Other chronic pain: Secondary | ICD-10-CM | POA: Diagnosis not present

## 2015-12-29 DIAGNOSIS — M5442 Lumbago with sciatica, left side: Secondary | ICD-10-CM | POA: Diagnosis not present

## 2016-01-18 DIAGNOSIS — M5442 Lumbago with sciatica, left side: Secondary | ICD-10-CM | POA: Diagnosis not present

## 2016-01-20 DIAGNOSIS — M1711 Unilateral primary osteoarthritis, right knee: Secondary | ICD-10-CM | POA: Diagnosis not present

## 2016-03-07 DIAGNOSIS — C44319 Basal cell carcinoma of skin of other parts of face: Secondary | ICD-10-CM | POA: Diagnosis not present

## 2016-04-30 DIAGNOSIS — M65351 Trigger finger, right little finger: Secondary | ICD-10-CM | POA: Diagnosis not present

## 2016-05-02 DIAGNOSIS — M1711 Unilateral primary osteoarthritis, right knee: Secondary | ICD-10-CM | POA: Diagnosis not present

## 2016-06-18 DIAGNOSIS — Z23 Encounter for immunization: Secondary | ICD-10-CM | POA: Diagnosis not present

## 2016-06-18 DIAGNOSIS — L570 Actinic keratosis: Secondary | ICD-10-CM | POA: Diagnosis not present

## 2016-06-18 DIAGNOSIS — D225 Melanocytic nevi of trunk: Secondary | ICD-10-CM | POA: Diagnosis not present

## 2016-06-18 DIAGNOSIS — L814 Other melanin hyperpigmentation: Secondary | ICD-10-CM | POA: Diagnosis not present

## 2016-06-18 DIAGNOSIS — D1801 Hemangioma of skin and subcutaneous tissue: Secondary | ICD-10-CM | POA: Diagnosis not present

## 2016-06-18 DIAGNOSIS — L821 Other seborrheic keratosis: Secondary | ICD-10-CM | POA: Diagnosis not present

## 2016-06-18 DIAGNOSIS — Z85828 Personal history of other malignant neoplasm of skin: Secondary | ICD-10-CM | POA: Diagnosis not present

## 2016-06-18 DIAGNOSIS — L219 Seborrheic dermatitis, unspecified: Secondary | ICD-10-CM | POA: Diagnosis not present

## 2016-06-18 DIAGNOSIS — Z86018 Personal history of other benign neoplasm: Secondary | ICD-10-CM | POA: Diagnosis not present

## 2016-07-23 ENCOUNTER — Other Ambulatory Visit: Payer: Self-pay | Admitting: Family Medicine

## 2016-07-23 ENCOUNTER — Other Ambulatory Visit: Payer: Self-pay | Admitting: Obstetrics and Gynecology

## 2016-07-23 DIAGNOSIS — Z1231 Encounter for screening mammogram for malignant neoplasm of breast: Secondary | ICD-10-CM

## 2016-08-22 DIAGNOSIS — M1711 Unilateral primary osteoarthritis, right knee: Secondary | ICD-10-CM | POA: Diagnosis not present

## 2016-09-12 DIAGNOSIS — Z6827 Body mass index (BMI) 27.0-27.9, adult: Secondary | ICD-10-CM | POA: Diagnosis not present

## 2016-09-12 DIAGNOSIS — Z01419 Encounter for gynecological examination (general) (routine) without abnormal findings: Secondary | ICD-10-CM | POA: Diagnosis not present

## 2016-09-19 ENCOUNTER — Ambulatory Visit (INDEPENDENT_AMBULATORY_CARE_PROVIDER_SITE_OTHER): Payer: Medicare Other | Admitting: Family Medicine

## 2016-09-19 VITALS — BP 130/84 | HR 85 | Temp 97.7°F | Resp 16 | Ht 67.0 in | Wt 178.0 lb

## 2016-09-19 DIAGNOSIS — Z0000 Encounter for general adult medical examination without abnormal findings: Secondary | ICD-10-CM

## 2016-09-19 DIAGNOSIS — Z1159 Encounter for screening for other viral diseases: Secondary | ICD-10-CM

## 2016-09-19 DIAGNOSIS — E782 Mixed hyperlipidemia: Secondary | ICD-10-CM | POA: Diagnosis not present

## 2016-09-19 DIAGNOSIS — I10 Essential (primary) hypertension: Secondary | ICD-10-CM

## 2016-09-19 DIAGNOSIS — Z23 Encounter for immunization: Secondary | ICD-10-CM

## 2016-09-19 MED ORDER — BUPROPION HCL ER (XL) 300 MG PO TB24: 300.0000 mg | ORAL_TABLET | Freq: Every day | ORAL | 3 refills | Status: DC

## 2016-09-19 MED ORDER — FUROSEMIDE 40 MG PO TABS: 40.0000 mg | ORAL_TABLET | Freq: Every day | ORAL | 3 refills | Status: DC

## 2016-09-19 MED ORDER — ATORVASTATIN CALCIUM 20 MG PO TABS: 20.0000 mg | ORAL_TABLET | Freq: Every morning | ORAL | 3 refills | Status: DC

## 2016-09-19 NOTE — Progress Notes (Signed)
QUICK REFERENCE INFORMATION: The ABCs of Providing the Annual Wellness Visit  CMS.gov Medicare Learning Network  [employer] Annual Wellness Visit  Subjective:   [name] is a 66 y.o. Female who presents for an Annual Wellness Visit.  Patient Active Problem List   Diagnosis Date Noted  . Trochanteric bursitis of left hip 07/03/2015  . Routine general medical examination at a health care facility 11/18/2014  . CARCINOMA, SKIN, SQUAMOUS CELL 07/06/2010  . ACTINIC KERATOSIS 07/06/2010  . NEOPLASM, MALIGNANT, BASAL CELL, CARCINOMA, HX OF 07/06/2010  . Hyperlipidemia 06/08/2009  . DEPRESSION 06/08/2009  . POSTMENOPAUSAL STATUS 06/08/2009  . IRRITABLE BOWEL SYNDROME 02/17/2008  . BACK PAIN, CHRONIC 02/17/2008  . MIGRAINE HEADACHE 02/05/2007  . Essential hypertension 02/05/2007    Past Medical History:  Diagnosis Date  . Arthritis    knees, hands  . Bursitis of left hip   . Cancer ([place])    hx skin cancer  . Depression   . GERD (gastroesophageal reflux disease)    INFREQUENT  . Headache    HX MIGRAINES   . Heart murmur     MVP 30 yrs ago - no treatment needed  . Hyperlipidemia    on statin  . Hypertension    "flucuations" has never been on any med  . Insomnia   . Irritable bowel syndrome    s/p subtotal colectomy '86>diarrhea prone  . NEOPLASM, MALIGNANT, BASAL CELL, CARCINOMA, HX OF   . Seasonal allergies      Past Surgical History:  Procedure Laterality Date  . ABDOMINOPLASTY    . [place]   lower back  . BREAST ENHANCEMENT SURGERY  1983  . BREAST IMPLANT REMOVAL  08/2010  . BREAST SURGERY    . BUNIONECTOMY     right  and left foot  . CERVICAL SPINE SURGERY  12/18/1999   C5/6 and C6/7  . COLECTOMY  1986  . COSMETIC SURGERY    . EXCISION/RELEASE BURSA HIP Left 07/04/2015   Procedure: LEFT HIP BURSECTOMY WITH ;  Surgeon: Gaynelle Arabian, MD;  Location: [facility];  Service: Orthopedics;  Laterality: Left;  . EYE SURGERY    . eyelid surgery    .  FRACTURE SURGERY    . HYSTEROSCOPY W/D&C  08/17/2011   Procedure: DILATATION AND CURETTAGE (D&C) /HYSTEROSCOPY;  Surgeon: Margarette Asal;  Location: [facility];  Service: Gynecology;  Laterality: N/A;  . INJECTION KNEE Right 07/04/2015   Procedure: CORTISONE INJECTION RIGHT KNEE ;  Surgeon: Gaynelle Arabian, MD;  Location: [facility];  Service: Orthopedics;  Laterality: Right;  . KNEE ARTHROSCOPY     right x 2  . KNEE RECONSTRUCTION, MEDIAL PATELLAR FEMORAL LIGAMENT     right  . LASIK  2009    bilateral  . left foot surgery     removed foreign object  . OPEN SURGICAL REPAIR OF GLUTEAL TENDON Left 07/04/2015   Procedure: GLUTEAL TENDON REPAIR ;  Surgeon: Gaynelle Arabian, MD;  Location: [facility];  Service: Orthopedics;  Laterality: Left;  . thumb surgery    . TUBAL LIGATION       Outpatient Medications Prior to Visit  Medication Sig Dispense Refill  . CALCIUM-VITAMIN D PO Take 500 Units by mouth daily.     . diphenoxylate-atropine (LOMOTIL) 2.5-0.025 MG per tablet Take 1 tablet by mouth 4 (four) times daily as needed for diarrhea or loose stools.    . fluorouracil (EFUDEX) 5 % cream Apply 1 application topically daily. 3 weeks then off for  1-3 weeks then resume for 3 weeks." For skin cancers"  0  . HYDROcodone-acetaminophen (LORCET) 10-650 MG per tablet Take 0.5 tablets by mouth every 6 (six) hours as needed for pain.     Marland Kitchen loratadine (CLARITIN) 10 MG tablet Take 10 mg by mouth daily.     . mometasone (NASONEX) 50 MCG/ACT nasal spray Place 2 sprays into the nose daily as needed (congestion/allergies.).     Marland Kitchen OVER THE COUNTER MEDICATION Apply 4 patches topically 2 (two) times daily. Salon pos patches on hip.    . promethazine (PHENERGAN) 25 MG tablet Take 0.5 tablets (12.5 mg total) by mouth every 6 (six) hours as needed for nausea (migraines). 30 tablet 3  . Simethicone (PHAZYME PO) Take 1 tablet by mouth every morning.     Marland Kitchen atorvastatin (LIPITOR) 20 MG tablet Take 1 tablet (20 mg total) by mouth  every morning. 90 tablet 3  . buPROPion (WELLBUTRIN XL) 300 MG 24 hr tablet Take 1 tablet (300 mg total) by mouth daily. 90 tablet 3  . furosemide (LASIX) 40 MG tablet Take 1 tablet (40 mg total) by mouth daily. 90 tablet 3  . DiphenhydrAMINE HCl, Sleep, ([facility] SLEEP AID PO) Take 1 tablet by mouth at bedtime.    . Glucosamine-Chondroitin-MSM 500-400-125 MG TABS Take 1 tablet by mouth daily. Triflex supplement      No facility-administered medications prior to visit.     Allergies  Allergen Reactions  . Nsaids Other (See Comments)    Hurts stomach     Family History  Problem Relation Age of Onset  . Hypertension Mother   . Heart disease Mother   . Stomach cancer Father 71  . Cancer Father     stomach and liver cancer  . Colon cancer Neg Hx   . Diabetes Brother   . Heart disease Brother   . Hypertension Brother   . Cancer Daughter     breast cancer  . Hypertension Daughter   . Hypertension Brother   . Heart disease Brother      Social History   Social History  . Marital status: Married    Spouse name: N/A  . Number of children: N/A  . Years of education: N/A   Social History Main Topics  . Smoking status: Never Smoker  . 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 Alcohol use 0.0 oz/week     Comment: occasional/ 2 drinks a month  . Drug use: No  . Sexual activity: Yes    Birth control/ protection: Post-menopausal, Surgical   Other Topics Concern  . Not on file   Social History Narrative  . No narrative on file      Recent Hospitalizations? no  Health Habits: Current exercise activities include: none, golfs during warm weather Exercise: 0 times/week. Diet: in general, a "healthy" diet    Alcohol intake: rarely, once per month  Health Risk Assessment: The patient has completed a Health Risk Assessment. This has been reveiwed with them and has been  scanned into the [place] system as an attached document.  Current Medical Providers and Suppliers: [facility] Patient Care Team: Leandrew Koyanagi, MD (Inactive) as PCP - General (Internal Medicine) Molli Posey, MD (Obstetrics and Gynecology) Devra Dopp, MD (Dermatology) Gaynelle Arabian, MD (Orthopedic Surgery) Future Appointments Date Time Provider [place]  09/21/2016 10:30 AM [facility] MM 3 [facility]  [facility]     Age-appropriate Screening Schedule: The list below includes current immunization status and future screening recommendations based on patient's age. Orders for these recommended tests are listed in the plan section. The patient has been provided with a written plan. Immunization History  Administered Date(s) Administered  . Influenza Split 05/29/2012  . Influenza Whole 06/07/2009, 07/06/2010  . Influenza,inj,Quad PF,36+ Mos 08/03/2013, 06/18/2014, 06/09/2015  . Influenza-Unspecified 06/10/2016  . Pneumococcal Conjugate-13 09/14/2015  . Pneumococcal Polysaccharide-23 09/19/2016  . Tdap 05/29/2012  . Zoster 08/03/2013    Health Maintenance reviewed  Mammogram scheduled Labs done   Depression Screen-PHQ2/9 completed today  Depression screen [facility] 2/9 09/19/2016 09/14/2015 06/09/2015  Decreased Interest 0 0 0  Down, Depressed, Hopeless 0 0 0  PHQ - 2 Score 0 0 0      Depression Severity and Treatment Recommendations:  0-4= None  5-9= Mild / Treatment: Support, educate to call if worse; return in one month  10-14= Moderate / Treatment: Support, watchful waiting; Antidepressant or Psycotherapy  15-19= Moderately severe / Treatment: Antidepressant OR Psychotherapy  >= 20 = Major depression, severe / Antidepressant AND Psychotherapy  Functional Status Survey: Is the patient deaf or have difficulty hearing?: No Does the patient have difficulty seeing, even when wearing glasses/contacts?: No Does the patient have difficulty concentrating, remembering,  or making decisions?: Yes (little problem remembering things) Does the patient have difficulty walking or climbing stairs?: Yes (due to the need of a knee replacement) Does the patient have difficulty dressing or bathing?: No Does the patient have difficulty doing errands alone such as visiting a doctor's office or shopping?: No  Hearing Evaluation: 1. Do you have trouble hearing the television when others do not? no 2. Do you have to strain to hear/understand conversations? no   Advanced Care Planning: 1. Patient has executed an Advance Directive: yes  2. If no, patient was given the opportunity to execute an Advance Directive today? n/a  3. Are the patient's advanced directives in [place]? no 4. This patient has the ability to prepare an Advance Directive: yes 5. Provider is willing to follow the patient's wishes: yes  Cognitive Assessment: Does the patient have evidence of cognitive impairment? No The patient does not have any evidence of any cognitive problems and denies any  change in mood/affect, appearance, speech, memory or motor skills.  Identification of Risk Factors: Risk factors include:  Review of Systems  Constitutional: Negative for chills, fever and weight loss.  HENT: Negative for tinnitus.   Respiratory: Negative for cough and hemoptysis.   Cardiovascular: Negative for chest pain and palpitations.  Gastrointestinal: Positive for diarrhea. Negative for abdominal pain, nausea and vomiting.  Musculoskeletal: Positive for back pain, joint pain, myalgias and neck pain. Negative for falls.  Skin: Negative for itching and rash.  Neurological: Positive for headaches. Negative for dizziness.  Endo/Heme/Allergies: Positive for environmental allergies.  Psychiatric/Behavioral: The patient is nervous/anxious.     Objective:    Vitals:   09/19/16 0903  BP: 130/84  Pulse: 85  Resp: 16  Temp: 97.7 F (36.5 C)  TempSrc: Oral  SpO2: 97%  Weight: 178 lb (80.7 kg)    Height: 5\' 7"  (1.702 m)    Body mass index is 27.88 kg/m.   Physical Exam  Constitutional: She is oriented to person, place, and time. She appears well-developed and well-nourished.  HENT:  Head: Normocephalic and atraumatic.  Eyes: Conjunctivae are normal.  Cardiovascular: Normal rate, regular rhythm and normal heart sounds.  Pulmonary/Chest: Effort normal and breath sounds normal. No respiratory distress. She has no wheezes.  Abdominal: Soft. Bowel sounds are normal. She exhibits no distension and no mass. There is no tenderness. There is no rebound and no guarding.  Neurological: She is alert and oriented to person, place, and time.  Psychiatric: She has a normal mood and affect. Her behavior is normal. Judgment and thought content normal.     Assessment/Plan:   Patient Self-Management and Personalized Health Advice The patient has been provided with information about:   Discussed health screenings  Hep c screening labs  During the course of the visit the patient was educated and counseled about appropriate screening and preventive services including:  Mammogram Screening for cholesterol pneumovax  Body mass index is 27.88 kg/m. Discussed the patient's BMI with her. The BMI BMI is not in the acceptable range; BMI management plan is completed  Sydni was seen today for annual exam.  Diagnoses and all orders for this visit:  Encounter for Medicare annual wellness exam  Essential hypertension- stable on current meds -     Microalbumin, urine -     Comprehensive metabolic panel  Mixed hyperlipidemia- stable on current meds -     Comprehensive metabolic panel -     Lipid panel  Need for hepatitis C screening test -     HCV Ab w/Rflx to Verification  Need for prophylactic vaccination against Streptococcus pneumoniae (pneumococcus)  Other orders -     atorvastatin (LIPITOR) 20 MG tablet; Take 1 tablet (20 mg total) by mouth every morning. -     buPROPion  (WELLBUTRIN XL) 300 MG 24 hr tablet; Take 1 tablet (300 mg total) by mouth daily. -     furosemide (LASIX) 40 MG tablet; Take 1 tablet (40 mg total) by mouth daily. -     Pneumococcal polysaccharide vaccine 23-valent greater than or equal to 2yo subcutaneous/IM      No Follow-up on file.  Future Appointments Date Time Provider [place]  09/21/2016 10:30 AM [facility] MM 3 GI-[facility]    Patient Instructions       IF you received an x-ray today, you will receive an invoice from [facility]. Please contact [facility] at [phone] with questions or concerns regarding your invoice.   IF you received labwork today, you will receive an invoice from [facility]. Please contact [facility] at [phone] with questions or concerns regarding your invoice.   Our billing staff will not be able to assist you with questions regarding bills from these companies.  You will be contacted with the lab results as soon as they are available. The fastest way to get your results is to activate your My Chart account. Instructions are located on the last page of this paperwork. If you have not heard from [place] regarding the results in 2 weeks, please contact this office.     Hypertension Hypertension, commonly called high blood pressure, is when the force of blood pumping through your arteries is too strong. Your arteries are the blood vessels that carry blood from your heart throughout your body. A blood pressure reading consists of a higher number over a lower number, such as 110/72. The higher number (systolic) is the pressure inside your arteries when your heart pumps. The lower number (diastolic) is the pressure inside your arteries when your heart relaxes. Ideally you want your blood pressure below 120/80. Hypertension forces your heart to work harder to pump blood. Your arteries may become narrow or  stiff. Having untreated or uncontrolled hypertension can cause heart  attack, stroke, kidney disease, and other problems. What increases the risk? Some risk factors for high blood pressure are controllable. Others are not. Risk factors you cannot control include:  Race. You may be at higher risk if you are African American.  Age. Risk increases with age.  Gender. Men are at higher risk than women before age 20 years. After age 36, women are at higher risk than men. Risk factors you can control include:  Not getting enough exercise or physical activity.  Being overweight.  Getting too much fat, sugar, calories, or salt in your diet.  Drinking too much alcohol. What are the signs or symptoms? Hypertension does not usually cause signs or symptoms. Extremely high blood pressure (hypertensive crisis) may cause headache, anxiety, shortness of breath, and nosebleed. How is this diagnosed? To check if you have hypertension, your health care provider will measure your blood pressure while you are seated, with your arm held at the level of your heart. It should be measured at least twice using the same arm. Certain conditions can cause a difference in blood pressure between your right and left arms. A blood pressure reading that is higher than normal on one occasion does not mean that you need treatment. If it is not clear whether you have high blood pressure, you may be asked to return on a different day to have your blood pressure checked again. Or, you may be asked to monitor your blood pressure at home for 1 or more weeks. How is this treated? Treating high blood pressure includes making lifestyle changes and possibly taking medicine. Living a healthy lifestyle can help lower high blood pressure. You may need to change some of your habits. Lifestyle changes may include:  Following the DASH diet. This diet is high in fruits, vegetables, and whole grains. It is low in salt, red meat, and added sugars.  Keep your sodium intake below 2,300 mg per day.  Getting at  least 30-45 minutes of aerobic exercise at least 4 times per week.  Losing weight if necessary.  Not smoking.  Limiting alcoholic beverages.  Learning ways to reduce stress. Your health care provider may prescribe medicine if lifestyle changes are not enough to get your blood pressure under control, and if one of the following is true:  You are 23-108 years of age and your systolic blood pressure is above 140.  You are 86 years of age or older, and your systolic blood pressure is above 150.  Your diastolic blood pressure is above 90.  You have diabetes, and your systolic blood pressure is over [id number] or your diastolic blood pressure is over 90.  You have kidney disease and your blood pressure is above 140/90.  You have heart disease and your blood pressure is above 140/90. Your personal target blood pressure may vary depending on your medical conditions, your age, and other factors. Follow these instructions at home:  Have your blood pressure rechecked as directed by your health care provider.  Take medicines only as directed by your health care provider. Follow the directions carefully. Blood pressure medicines must be taken as prescribed. The medicine does not work as well when you skip doses. Skipping doses also puts you at risk for problems.  Do not smoke.  Monitor your blood pressure at home as directed by your health care provider. Contact a health care provider if:  You think you are having a reaction to medicines taken.  You have recurrent headaches or feel dizzy.  You have swelling in your ankles.  You have trouble with your vision. Get help right away if:  You develop a severe headache or confusion.  You have unusual weakness, numbness, or feel faint.  You have severe chest or abdominal pain.  You vomit repeatedly.  You have trouble breathing. This information is not intended to replace advice given to you by your health care provider. Make sure you discuss  any questions you have with your health care provider. Document Released: 08/27/2005 Document Revised: 02/02/2016 Document Reviewed: 06/19/2013 Elsevier Interactive Patient Education  2017 [employer].    An after visit summary with all of these plans was given to the patient.

## 2016-09-19 NOTE — Patient Instructions (Addendum)
IF you received an x-ray today, you will receive an invoice from [facility]. Please contact [facility] at [phone] with questions or concerns regarding your invoice.   IF you received labwork today, you will receive an invoice from [place]. Please contact [facility] at [phone] with questions or concerns regarding your invoice.   Our billing staff will not be able to assist you with questions regarding bills from these companies.  You will be contacted with the lab results as soon as they are available. The fastest way to get your results is to activate your My Chart account. Instructions are located on the last page of this paperwork. If you have not heard from [place] regarding the results in 2 weeks, please contact this office.     Hypertension Hypertension, commonly called high blood pressure, is when the force of blood pumping through your arteries is too strong. Your arteries are the blood vessels that carry blood from your heart throughout your body. A blood pressure reading consists of a higher number over a lower number, such as 110/72. The higher number (systolic) is the pressure inside your arteries when your heart pumps. The lower number (diastolic) is the pressure inside your arteries when your heart relaxes. Ideally you want your blood pressure below 120/80. Hypertension forces your heart to work harder to pump blood. Your arteries may become narrow or stiff. Having untreated or uncontrolled hypertension can cause heart attack, stroke, kidney disease, and other problems. What increases the risk? Some risk factors for high blood pressure are controllable. Others are not. Risk factors you cannot control include:  Race. You may be at higher risk if you are African American.  Age. Risk increases with age.  Gender. Men are at higher risk than women before age 70 years. After age 34, women are at higher risk than men. Risk factors you can control  include:  Not getting enough exercise or physical activity.  Being overweight.  Getting too much fat, sugar, calories, or salt in your diet.  Drinking too much alcohol. What are the signs or symptoms? Hypertension does not usually cause signs or symptoms. Extremely high blood pressure (hypertensive crisis) may cause headache, anxiety, shortness of breath, and nosebleed. How is this diagnosed? To check if you have hypertension, your health care provider will measure your blood pressure while you are seated, with your arm held at the level of your heart. It should be measured at least twice using the same arm. Certain conditions can cause a difference in blood pressure between your right and left arms. A blood pressure reading that is higher than normal on one occasion does not mean that you need treatment. If it is not clear whether you have high blood pressure, you may be asked to return on a different day to have your blood pressure checked again. Or, you may be asked to monitor your blood pressure at home for 1 or more weeks. How is this treated? Treating high blood pressure includes making lifestyle changes and possibly taking medicine. Living a healthy lifestyle can help lower high blood pressure. You may need to change some of your habits. Lifestyle changes may include:  Following the DASH diet. This diet is high in fruits, vegetables, and whole grains. It is low in salt, red meat, and added sugars.  Keep your sodium intake below 2,300 mg per day.  Getting at least 30-45 minutes of aerobic exercise at least 4 times per week.  Losing weight if necessary.  Not smoking.  Limiting alcoholic beverages.  Learning ways to reduce stress. Your health care provider may prescribe medicine if lifestyle changes are not enough to get your blood pressure under control, and if one of the following is true:  You are 15-79 years of age and your systolic blood pressure is above 140.  You are 70  years of age or older, and your systolic blood pressure is above 150.  Your diastolic blood pressure is above 90.  You have diabetes, and your systolic blood pressure is over [id number] or your diastolic blood pressure is over 90.  You have kidney disease and your blood pressure is above 140/90.  You have heart disease and your blood pressure is above 140/90. Your personal target blood pressure may vary depending on your medical conditions, your age, and other factors. Follow these instructions at home:  Have your blood pressure rechecked as directed by your health care provider.  Take medicines only as directed by your health care provider. Follow the directions carefully. Blood pressure medicines must be taken as prescribed. The medicine does not work as well when you skip doses. Skipping doses also puts you at risk for problems.  Do not smoke.  Monitor your blood pressure at home as directed by your health care provider. Contact a health care provider if:  You think you are having a reaction to medicines taken.  You have recurrent headaches or feel dizzy.  You have swelling in your ankles.  You have trouble with your vision. Get help right away if:  You develop a severe headache or confusion.  You have unusual weakness, numbness, or feel faint.  You have severe chest or abdominal pain.  You vomit repeatedly.  You have trouble breathing. This information is not intended to replace advice given to you by your health care provider. Make sure you discuss any questions you have with your health care provider. Document Released: 08/27/2005 Document Revised: 02/02/2016 Document Reviewed: 06/19/2013 Elsevier Interactive Patient Education  2017 [employer].

## 2016-09-20 LAB — COMPREHENSIVE METABOLIC PANEL
A/G RATIO: 2.1 (ref 1.2–2.2)
ALT: 21 IU/L (ref 0–32)
AST: 17 IU/L (ref 0–40)
Albumin: 4.8 g/dL (ref 3.6–4.8)
Alkaline Phosphatase: 79 IU/L (ref 39–117)
BILIRUBIN TOTAL: 0.5 mg/dL (ref 0.0–1.2)
BUN/Creatinine Ratio: 19 (ref 12–28)
BUN: 18 mg/dL (ref 8–27)
CHLORIDE: 100 mmol/L (ref 96–106)
CO2: 27 mmol/L (ref 18–29)
Calcium: 10 mg/dL (ref 8.7–10.3)
Creatinine, Ser: 0.97 mg/dL (ref 0.57–1.00)
GFR calc Af Amer: 70 mL/min/{1.73_m2} (ref >59)
GFR calc non Af Amer: 61 mL/min/{1.73_m2} (ref >59)
GLOBULIN, TOTAL: 2.3 g/dL (ref 1.5–4.5)
Glucose: 88 mg/dL (ref 65–99)
POTASSIUM: 4.6 mmol/L (ref 3.5–5.2)
SODIUM: 145 mmol/L — AB (ref 134–144)
Total Protein: 7.1 g/dL (ref 6.0–8.5)

## 2016-09-20 LAB — LIPID PANEL
Chol/HDL Ratio: 2.7 ratio units (ref 0.0–4.4)
Cholesterol, Total: 176 mg/dL (ref 100–199)
HDL: 66 mg/dL (ref >39)
LDL Calculated: 81 mg/dL (ref 0–99)
TRIGLYCERIDES: 144 mg/dL (ref 0–149)
VLDL Cholesterol Cal: 29 mg/dL (ref 5–40)

## 2016-09-20 LAB — HCV AB W/RFLX TO VERIFICATION

## 2016-09-20 LAB — MICROALBUMIN, URINE: Microalbumin, Urine: 5.1 ug/mL

## 2016-09-20 LAB — HCV INTERPRETATION

## 2016-09-21 ENCOUNTER — Ambulatory Visit
Admission: RE | Admit: 2016-09-21 | Discharge: 2016-09-21 | Disposition: A | Discharge status: Home / Self Care | Payer: Medicare Other | Source: Ambulatory Visit | Attending: Family Medicine | Admitting: Family Medicine

## 2016-09-21 DIAGNOSIS — Z1231 Encounter for screening mammogram for malignant neoplasm of breast: Secondary | ICD-10-CM

## 2016-09-24 ENCOUNTER — Other Ambulatory Visit: Payer: Self-pay | Admitting: Family Medicine

## 2016-09-24 DIAGNOSIS — R928 Other abnormal and inconclusive findings on diagnostic imaging of breast: Secondary | ICD-10-CM

## 2016-10-08 ENCOUNTER — Ambulatory Visit
Admission: RE | Admit: 2016-10-08 | Discharge: 2016-10-08 | Disposition: A | Discharge status: Home / Self Care | Payer: Medicare Other | Source: Ambulatory Visit | Attending: Family Medicine | Admitting: Family Medicine

## 2016-10-08 DIAGNOSIS — R928 Other abnormal and inconclusive findings on diagnostic imaging of breast: Secondary | ICD-10-CM

## 2016-10-08 DIAGNOSIS — N6321 Unspecified lump in the left breast, upper outer quadrant: Secondary | ICD-10-CM | POA: Diagnosis not present

## 2016-11-13 DIAGNOSIS — D485 Neoplasm of uncertain behavior of skin: Secondary | ICD-10-CM | POA: Diagnosis not present

## 2016-11-13 DIAGNOSIS — L97829 Non-pressure chronic ulcer of other part of left lower leg with unspecified severity: Secondary | ICD-10-CM | POA: Diagnosis not present

## 2016-11-26 ENCOUNTER — Ambulatory Visit (INDEPENDENT_AMBULATORY_CARE_PROVIDER_SITE_OTHER): Payer: Medicare Other | Admitting: Urgent Care

## 2016-11-26 VITALS — BP 152/84 | HR 90 | Temp 98.3°F | Ht 67.0 in | Wt 180.4 lb

## 2016-11-26 DIAGNOSIS — Z8669 Personal history of other diseases of the nervous system and sense organs: Secondary | ICD-10-CM | POA: Diagnosis not present

## 2016-11-26 DIAGNOSIS — R519 Headache, unspecified: Secondary | ICD-10-CM

## 2016-11-26 DIAGNOSIS — R51 Headache: Secondary | ICD-10-CM

## 2016-11-26 DIAGNOSIS — R110 Nausea: Secondary | ICD-10-CM

## 2016-11-26 DIAGNOSIS — R05 Cough: Secondary | ICD-10-CM

## 2016-11-26 DIAGNOSIS — J029 Acute pharyngitis, unspecified: Secondary | ICD-10-CM | POA: Diagnosis not present

## 2016-11-26 DIAGNOSIS — R059 Cough, unspecified: Secondary | ICD-10-CM

## 2016-11-26 LAB — POCT RAPID STREP A (OFFICE): Rapid Strep A Screen: NEGATIVE

## 2016-11-26 MED ORDER — HYDROCOD POLST-CPM POLST ER 10-8 MG/5ML PO SUER: 5.0000 mL | Freq: Every evening | ORAL | 0 refills | Status: DC | PRN

## 2016-11-26 MED ORDER — BENZONATATE 100 MG PO CAPS: 100.0000 mg | ORAL_CAPSULE | Freq: Three times a day (TID) | ORAL | 0 refills | Status: DC | PRN

## 2016-11-26 NOTE — Patient Instructions (Addendum)
Influenza, Adult Influenza, more commonly known as "the flu," is a viral infection that primarily affects the respiratory tract. The respiratory tract includes organs that help you breathe, such as the lungs, nose, and throat. The flu causes many common cold symptoms, as well as a high fever and body aches. The flu spreads easily from person to person (is contagious). Getting a flu shot (influenza vaccination) every year is the best way to prevent influenza. What are the causes? Influenza is caused by a virus. You can catch the virus by:  Breathing in droplets from an infected person's cough or sneeze.  Touching something that was recently contaminated with the virus and then touching your mouth, nose, or eyes.  What increases the risk? The following factors may make you more likely to get the flu:  Not cleaning your hands frequently with soap and water or alcohol-based hand sanitizer.  Having close contact with many people during cold and flu season.  Touching your mouth, eyes, or nose without washing or sanitizing your hands first.  Not drinking enough fluids or not eating a healthy diet.  Not getting enough sleep or exercise.  Being under a high amount of stress.  Not getting a yearly (annual) flu shot.  You may be at a higher risk of complications from the flu, such as a severe lung infection (pneumonia), if you:  Are over the age of 65.  Are pregnant.  Have a weakened disease-fighting system (immune system). You may have a weakened immune system if you: ? Have HIV or AIDS. ? Are undergoing chemotherapy. ? Aretaking medicines that reduce the activity of (suppress) the immune system.  Have a long-term (chronic) illness, such as heart disease, kidney disease, diabetes, or lung disease.  Have a liver disorder.  Are obese.  Have anemia.  What are the signs or symptoms? Symptoms of this condition typically last 4-10 days and may  include:  Fever.  Chills.  Headache, body aches, or muscle aches.  Sore throat.  Cough.  Runny or congested nose.  Chest discomfort and cough.  Poor appetite.  Weakness or tiredness (fatigue).  Dizziness.  Nausea or vomiting.  How is this diagnosed? This condition may be diagnosed based on your medical history and a physical exam. Your health care provider may do a nose or throat swab test to confirm the diagnosis. How is this treated? If influenza is detected early, you can be treated with antiviral medicine that can reduce the length of your illness and the severity of your symptoms. This medicine may be given by mouth (orally) or through an IV tube that is inserted in one of your veins. The goal of treatment is to relieve symptoms by taking care of yourself at home. This may include taking over-the-counter medicines, drinking plenty of fluids, and adding humidity to the air in your home. In some cases, influenza goes away on its own. Severe influenza or complications from influenza may be treated in a hospital. Follow these instructions at home:  Take over-the-counter and prescription medicines only as told by your health care provider.  Use a cool mist humidifier to add humidity to the air in your home. This can make breathing easier.  Rest as needed.  Drink enough fluid to keep your urine clear or pale yellow.  Cover your mouth and nose when you cough or sneeze.  Wash your hands with soap and water often, especially after you cough or sneeze. If soap and water are not available, use hand sanitizer.    Stay home from work or school as told by your health care provider. Unless you are visiting your health care provider, try to avoid leaving home until your fever has been gone for 24 hours without the use of medicine.  Keep all follow-up visits as told by your health care provider. This is important. How is this prevented?  Getting an annual flu shot is the best way  to avoid getting the flu. You may get the flu shot in late summer, fall, or winter. Ask your health care provider when you should get your flu shot.  Wash your hands often or use hand sanitizer often.  Avoid contact with people who are sick during cold and flu season.  Eat a healthy diet, drink plenty of fluids, get enough sleep, and exercise regularly. Contact a health care provider if:  You develop new symptoms.  You have: ? Chest pain. ? Diarrhea. ? A fever.  Your cough gets worse.  You produce more mucus.  You feel nauseous or you vomit. Get help right away if:  You develop shortness of breath or difficulty breathing.  Your skin or nails turn a bluish color.  You have severe pain or stiffness in your neck.  You develop a sudden headache or sudden pain in your face or ear.  You cannot stop vomiting. This information is not intended to replace advice given to you by your health care provider. Make sure you discuss any questions you have with your health care provider. Document Released: 08/24/2000 Document Revised: 02/02/2016 Document Reviewed: 06/21/2015 Elsevier Interactive Patient Education  2017 [employer]    IF you received an x-ray today, you will receive an invoice from [facility]. Please contact [facility] at [phone] with questions or concerns regarding your invoice.   IF you received labwork today, you will receive an invoice from [facility]. Please contact [facility] at [phone] with questions or concerns regarding your invoice.   Our billing staff will not be able to assist you with questions regarding bills from these companies.  You will be contacted with the lab results as soon as they are available. The fastest way to get your results is to activate your My Chart account. Instructions are located on the last page of this paperwork. If you have not heard from us regarding the results in 2 weeks, please contact this office.       

## 2016-11-26 NOTE — Progress Notes (Signed)
  MRN: [id number] DOB: 01-10-1951  Subjective:   [name] is a 66 y.o. female presenting for chief complaint of Sore Throat (X 3 days); Headache (X 1 day ); and Nausea ( X 1 day with stomach pain)  Reports 3 day history of sore throat, dry cough worse when she lays down, headache, nausea without vomiting, mid-abdominal pain, belching, body aches, malaise. Has tried otc Vitamin C, DayQuil with some relief. Takes Claritin daily for allergies. Has a history of migraines, has had more blurred vision, double vision occasionally. She does not take migraine medications now. Has a supply of phenergan at home which she plans on using. Denies sinus pain, ear pain, chest pain, shob, rashes. Denies smoking cigarettes. Denies history of asthma.   [name] has a current medication list which includes the following prescription(s): atorvastatin, bupropion, calcium-vitamin d, diclofenac sodium, diphenoxylate-atropine, fluorouracil, furosemide, hydrocodone-acetaminophen, loratadine, mometasone, OVER THE COUNTER MEDICATION, promethazine, simethicone, diphenhydramine hcl (sleep), and glucosamine-chondroitin-msm. Also is allergic to nsaids.  [name]  has a past medical history of Arthritis; Bursitis of left hip; Cancer ([place]); Depression; GERD (gastroesophageal reflux disease); Headache; Heart murmur; Hyperlipidemia; Hypertension; Insomnia; Irritable bowel syndrome; NEOPLASM, MALIGNANT, BASAL CELL, CARCINOMA, HX OF; and Seasonal allergies. Also  has a past surgical history that includes Bunionectomy; Cervical spine surgery (12/18/1999); Breast enhancement surgery (1983); Breast implant removal (08/2010); LASIK (2009); Back surgery (1999); Colectomy (1986); Knee arthroscopy; thumb surgery; left foot surgery; Abdominoplasty; eyelid surgery; Hysteroscopy w/D&C (08/17/2011); Knee reconstruction, medial patellar femoral ligament; Breast surgery; Cosmetic surgery; Eye surgery; Fracture surgery; Tubal ligation; Excision/release bursa  hip (Left, 07/04/2015); Open surgical repair of gluteal tendon (Left, 07/04/2015); and Injection knee (Right, 07/04/2015).  Objective:   Vitals: BP (!) 152/84 (BP Location: Right Arm, Patient Position: Sitting, Cuff Size: Small)   Pulse 90   Temp 98.3 F (36.8 C) (Oral)   Ht 5\' 7"  (1.702 m)   Wt 180 lb 6.4 oz (81.8 kg)   SpO2 96%   BMI 28.25 kg/m   Physical Exam  Constitutional: She is oriented to person, place, and time. She appears well-developed and well-nourished.  HENT:  TM's intact bilaterally, no effusions or erythema. Nasal turbinates pink and moist, nasal passages patent. No sinus tenderness. Oropharynx with slight post-nasal drainage, mucous membranes moist, dentition in good repair.  Eyes: No scleral icterus.  Neck: Normal range of motion. Neck supple.  Cardiovascular: Normal rate, regular rhythm and intact distal pulses.  Exam reveals no gallop and no friction rub.   No murmur heard. Pulmonary/Chest: No respiratory distress. She has no wheezes. She has no rales.  Lymphadenopathy:    She has no cervical adenopathy.  Neurological: She is alert and oriented to person, place, and time.  Skin: Skin is warm and dry.   Results for orders placed or performed in visit on 11/26/16 (from the past 24 hour(s))  POCT rapid strep A     Status: None   Collection Time: 11/26/16 12:58 PM  Result Value Ref Range   Rapid Strep A Screen Negative Negative   Assessment and Plan :   1. Sore throat 2. Cough 3. Nausea without vomiting 4. Acute nonintractable headache, unspecified headache type 5. History of migraine headaches - Will manage as influenza. Start supportive care, strep culture pending.   Jaynee Eagles, PA-C [facility] [phone] 11/26/2016  12:22 PM

## 2016-11-29 LAB — CULTURE, GROUP A STREP: STREP A CULTURE: NEGATIVE

## 2016-12-17 DIAGNOSIS — Z86018 Personal history of other benign neoplasm: Secondary | ICD-10-CM | POA: Diagnosis not present

## 2016-12-17 DIAGNOSIS — L814 Other melanin hyperpigmentation: Secondary | ICD-10-CM | POA: Diagnosis not present

## 2016-12-17 DIAGNOSIS — Z85828 Personal history of other malignant neoplasm of skin: Secondary | ICD-10-CM | POA: Diagnosis not present

## 2016-12-17 DIAGNOSIS — D2122 Benign neoplasm of connective and other soft tissue of left lower limb, including hip: Secondary | ICD-10-CM | POA: Diagnosis not present

## 2016-12-17 DIAGNOSIS — D485 Neoplasm of uncertain behavior of skin: Secondary | ICD-10-CM | POA: Diagnosis not present

## 2016-12-17 DIAGNOSIS — D225 Melanocytic nevi of trunk: Secondary | ICD-10-CM | POA: Diagnosis not present

## 2016-12-17 DIAGNOSIS — L570 Actinic keratosis: Secondary | ICD-10-CM | POA: Diagnosis not present

## 2016-12-17 DIAGNOSIS — D1801 Hemangioma of skin and subcutaneous tissue: Secondary | ICD-10-CM | POA: Diagnosis not present

## 2016-12-17 DIAGNOSIS — L821 Other seborrheic keratosis: Secondary | ICD-10-CM | POA: Diagnosis not present

## 2016-12-21 DIAGNOSIS — M1711 Unilateral primary osteoarthritis, right knee: Secondary | ICD-10-CM | POA: Diagnosis not present

## 2016-12-31 DIAGNOSIS — D485 Neoplasm of uncertain behavior of skin: Secondary | ICD-10-CM | POA: Diagnosis not present

## 2016-12-31 DIAGNOSIS — C44799 Other specified malignant neoplasm of skin of left lower limb, including hip: Secondary | ICD-10-CM | POA: Diagnosis not present

## 2017-01-01 DIAGNOSIS — D485 Neoplasm of uncertain behavior of skin: Secondary | ICD-10-CM | POA: Diagnosis not present

## 2017-01-15 DIAGNOSIS — L570 Actinic keratosis: Secondary | ICD-10-CM | POA: Diagnosis not present

## 2017-03-12 DIAGNOSIS — M1711 Unilateral primary osteoarthritis, right knee: Secondary | ICD-10-CM | POA: Diagnosis not present

## 2017-04-15 DIAGNOSIS — S51811A Laceration without foreign body of right forearm, initial encounter: Secondary | ICD-10-CM | POA: Diagnosis not present

## 2017-04-15 DIAGNOSIS — Z23 Encounter for immunization: Secondary | ICD-10-CM | POA: Diagnosis not present

## 2017-05-09 DIAGNOSIS — M1711 Unilateral primary osteoarthritis, right knee: Secondary | ICD-10-CM | POA: Diagnosis not present

## 2017-05-22 NOTE — Progress Notes (Signed)
Please place orders in EPIC as patient is being scheduled for a pre-op appointment! Thank you! 

## 2017-05-28 DIAGNOSIS — Z86018 Personal history of other benign neoplasm: Secondary | ICD-10-CM | POA: Diagnosis not present

## 2017-05-28 DIAGNOSIS — D225 Melanocytic nevi of trunk: Secondary | ICD-10-CM | POA: Diagnosis not present

## 2017-05-28 DIAGNOSIS — Z85828 Personal history of other malignant neoplasm of skin: Secondary | ICD-10-CM | POA: Diagnosis not present

## 2017-05-28 DIAGNOSIS — L821 Other seborrheic keratosis: Secondary | ICD-10-CM | POA: Diagnosis not present

## 2017-05-28 DIAGNOSIS — L111 Transient acantholytic dermatosis [Grover]: Secondary | ICD-10-CM | POA: Diagnosis not present

## 2017-05-28 DIAGNOSIS — L570 Actinic keratosis: Secondary | ICD-10-CM | POA: Diagnosis not present

## 2017-05-28 DIAGNOSIS — D1801 Hemangioma of skin and subcutaneous tissue: Secondary | ICD-10-CM | POA: Diagnosis not present

## 2017-05-28 DIAGNOSIS — Z23 Encounter for immunization: Secondary | ICD-10-CM | POA: Diagnosis not present

## 2017-05-28 DIAGNOSIS — L814 Other melanin hyperpigmentation: Secondary | ICD-10-CM | POA: Diagnosis not present

## 2017-05-30 ENCOUNTER — Ambulatory Visit: Payer: Self-pay | Admitting: Orthopedic Surgery

## 2017-06-07 NOTE — Patient Instructions (Signed)
[name]  06/07/2017   Your procedure is scheduled on: 06-17-17   Report to [facility] Main  Entrance Report to Admitting at 6:55 AM   Call this number if you have problems the morning of surgery [phone]   Remember: ONLY 1 PERSON MAY GO WITH YOU TO SHORT STAY TO GET  READY MORNING OF YOUR SURGERY.  Do not eat food or drink liquids :After Midnight.     Take these medicines the morning of surgery with A SIP OF WATER:Bupropion (Wellbutrin), Atorvastatin (Lipitor) and Loratadine (Claritin)                                 You may not have any metal on your body including hair pins and              piercings  Do not wear jewelry, make-up, lotions, powders or perfumes, deodorant             Do not wear nail polish.  Do not shave  48 hours prior to surgery.               Do not bring valuables to the hospital. [place].  Contacts, dentures or bridgework may not be worn into surgery.  Leave suitcase in the car. After surgery it may be brought to your room.                 Please read over the following fact sheets you were given: _____________________________________________________________________             [facility] - Preparing for Surgery Before surgery, you can play an important role.  Because skin is not sterile, your skin needs to be as free of germs as possible.  You can reduce the number of germs on your skin by washing with CHG (chlorahexidine gluconate) soap before surgery.  CHG is an antiseptic cleaner which kills germs and bonds with the skin to continue killing germs even after washing. Please DO NOT use if you have an allergy to CHG or antibacterial soaps.  If your skin becomes reddened/irritated stop using the CHG and inform your nurse when you arrive at Short Stay. Do not shave (including legs and underarms) for at least 48 hours prior to the first CHG shower.  You may shave your  face/neck. Please follow these instructions carefully:  1.  Shower with CHG Soap the night before surgery and the  morning of Surgery.  2.  If you choose to wash your hair, wash your hair first as usual with your  normal  shampoo.  3.  After you shampoo, rinse your hair and body thoroughly to remove the  shampoo.                           4.  Use CHG as you would any other liquid soap.  You can apply chg directly  to the skin and wash                       Gently with a scrungie or clean washcloth.  5.  Apply the CHG Soap to your body ONLY FROM THE NECK DOWN.  Do not use on face/ open                           Wound or open sores. Avoid contact with eyes, ears mouth and genitals (private parts).                       Wash face,  Genitals (private parts) with your normal soap.             6.  Wash thoroughly, paying special attention to the area where your surgery  will be performed.  7.  Thoroughly rinse your body with warm water from the neck down.  8.  DO NOT shower/wash with your normal soap after using and rinsing off  the CHG Soap.                9.  Pat yourself dry with a clean towel.            10.  Wear clean pajamas.            11.  Place clean sheets on your bed the night of your first shower and do not  sleep with pets. Day of Surgery : Do not apply any lotions/deodorants the morning of surgery.  Please wear clean clothes to the hospital/surgery center.  FAILURE TO FOLLOW THESE INSTRUCTIONS MAY RESULT IN THE CANCELLATION OF YOUR SURGERY PATIENT SIGNATURE_________________________________  NURSE SIGNATURE__________________________________  ________________________________________________________________________   Adam Phenix  An incentive spirometer is a tool that can help keep your lungs clear and active. This tool measures how well you are filling your lungs with each breath. Taking long deep breaths may help reverse or decrease the chance of developing breathing  (pulmonary) problems (especially infection) following:  A long period of time when you are unable to move or be active. BEFORE THE PROCEDURE   If the spirometer includes an indicator to show your best effort, your nurse or respiratory therapist will set it to a desired goal.  If possible, sit up straight or lean slightly forward. Try not to slouch.  Hold the incentive spirometer in an upright position. INSTRUCTIONS FOR USE  1. Sit on the edge of your bed if possible, or sit up as far as you can in bed or on a chair. 2. Hold the incentive spirometer in an upright position. 3. Breathe out normally. 4. Place the mouthpiece in your mouth and seal your lips tightly around it. 5. Breathe in slowly and as deeply as possible, raising the piston or the ball toward the top of the column. 6. Hold your breath for 3-5 seconds or for as long as possible. Allow the piston or ball to fall to the bottom of the column. 7. Remove the mouthpiece from your mouth and breathe out normally. 8. Rest for a few seconds and repeat Steps 1 through 7 at least 10 times every 1-2 hours when you are awake. Take your time and take a few normal breaths between deep breaths. 9. The spirometer may include an indicator to show your best effort. Use the indicator as a goal to work toward during each repetition. 10. After each set of 10 deep breaths, practice coughing to be sure your lungs are clear. If you have an incision (the cut made at the time of surgery), support your incision when coughing by placing a pillow or rolled up towels firmly against it. Once you are able to get out of  bed, walk around indoors and cough well. You may stop using the incentive spirometer when instructed by your caregiver.  RISKS AND COMPLICATIONS  Take your time so you do not get dizzy or light-headed.  If you are in pain, you may need to take or ask for pain medication before doing incentive spirometry. It is harder to take a deep breath if you  are having pain. AFTER USE  Rest and breathe slowly and easily.  It can be helpful to keep track of a log of your progress. Your caregiver can provide you with a simple table to help with this. If you are using the spirometer at home, follow these instructions: [place] IF:   You are having difficultly using the spirometer.  You have trouble using the spirometer as often as instructed.  Your pain medication is not giving enough relief while using the spirometer.  You develop fever of 100.5 F (38.1 C) or higher. SEEK IMMEDIATE MEDICAL CARE IF:   You cough up bloody sputum that had not been present before.  You develop fever of 102 F (38.9 C) or greater.  You develop worsening pain at or near the incision site. MAKE SURE YOU:   Understand these instructions.  Will watch your condition.  Will get help right away if you are not doing well or get worse. Document Released: 01/07/2007 Document Revised: 11/19/2011 Document Reviewed: 03/10/2007 Exit[employer] Patient Information 2014 ExitCare, [place].   ________________________________________________________________________  WHAT IS A BLOOD TRANSFUSION? Blood Transfusion Information  A transfusion is the replacement of blood or some of its parts. Blood is made up of multiple cells which provide different functions.  Red blood cells carry oxygen and are used for blood loss replacement.  White blood cells fight against infection.  Platelets control bleeding.  Plasma helps clot blood.  Other blood products are available for specialized needs, such as hemophilia or other clotting disorders. BEFORE THE TRANSFUSION  Who gives blood for transfusions?   Healthy volunteers who are fully evaluated to make sure their blood is safe. This is blood bank blood. Transfusion therapy is the safest it has ever been in the practice of medicine. Before blood is taken from a donor, a complete history is taken to make sure that person has  no history of diseases nor engages in risky social behavior (examples are intravenous drug use or sexual activity with multiple partners). The donor's travel history is screened to minimize risk of transmitting infections, such as malaria. The donated blood is tested for signs of infectious diseases, such as HIV and hepatitis. The blood is then tested to be sure it is compatible with you in order to minimize the chance of a transfusion reaction. If you or a relative donates blood, this is often done in anticipation of surgery and is not appropriate for emergency situations. It takes many days to process the donated blood. RISKS AND COMPLICATIONS Although transfusion therapy is very safe and saves many lives, the main dangers of transfusion include:   Getting an infectious disease.  Developing a transfusion reaction. This is an allergic reaction to something in the blood you were given. Every precaution is taken to prevent this. The decision to have a blood transfusion has been considered carefully by your caregiver before blood is given. Blood is not given unless the benefits outweigh the risks. AFTER THE TRANSFUSION  Right after receiving a blood transfusion, you will usually feel much better and more energetic. This is especially true if your red blood  cells have gotten low (anemic). The transfusion raises the level of the red blood cells which carry oxygen, and this usually causes an energy increase.  The nurse administering the transfusion will monitor you carefully for complications. HOME CARE INSTRUCTIONS  No special instructions are needed after a transfusion. You may find your energy is better. Speak with your caregiver about any limitations on activity for underlying diseases you may have. SEEK MEDICAL CARE IF:   Your condition is not improving after your transfusion.  You develop redness or irritation at the intravenous (IV) site. SEEK IMMEDIATE MEDICAL CARE IF:  Any of the following  symptoms occur over the next 12 hours:  Shaking chills.  You have a temperature by mouth above 102 F (38.9 C), not controlled by medicine.  Chest, back, or muscle pain.  People around you feel you are not acting correctly or are confused.  Shortness of breath or difficulty breathing.  Dizziness and fainting.  You get a rash or develop hives.  You have a decrease in urine output.  Your urine turns a dark color or changes to pink, red, or brown. Any of the following symptoms occur over the next 10 days:  You have a temperature by mouth above 102 F (38.9 C), not controlled by medicine.  Shortness of breath.  Weakness after normal activity.  The white part of the eye turns yellow (jaundice).  You have a decrease in the amount of urine or are urinating less often.  Your urine turns a dark color or changes to pink, red, or brown. Document Released: 08/24/2000 Document Revised: 11/19/2011 Document Reviewed: 04/12/2008 [facility] Patient Information 2014 [place], [place].  _______________________________________________________________________

## 2017-06-10 ENCOUNTER — Encounter (HOSPITAL_COMMUNITY): Payer: Self-pay

## 2017-06-10 ENCOUNTER — Encounter (HOSPITAL_COMMUNITY)
Admission: RE | Admit: 2017-06-10 | Discharge: 2017-06-10 | Disposition: A | Discharge status: Home / Self Care | Payer: Medicare Other | Source: Ambulatory Visit | Attending: Orthopedic Surgery | Admitting: Orthopedic Surgery

## 2017-06-10 DIAGNOSIS — Z01810 Encounter for preprocedural cardiovascular examination: Secondary | ICD-10-CM | POA: Insufficient documentation

## 2017-06-10 DIAGNOSIS — M1711 Unilateral primary osteoarthritis, right knee: Secondary | ICD-10-CM | POA: Insufficient documentation

## 2017-06-10 DIAGNOSIS — Z01812 Encounter for preprocedural laboratory examination: Secondary | ICD-10-CM | POA: Insufficient documentation

## 2017-06-10 DIAGNOSIS — I10 Essential (primary) hypertension: Secondary | ICD-10-CM | POA: Insufficient documentation

## 2017-06-10 LAB — COMPREHENSIVE METABOLIC PANEL
ALBUMIN: 4.9 g/dL (ref 3.5–5.0)
ALK PHOS: 77 U/L (ref 38–126)
ALT: 23 U/L (ref 14–54)
AST: 24 U/L (ref 15–41)
Anion gap: 10 (ref 5–15)
BILIRUBIN TOTAL: 0.8 mg/dL (ref 0.3–1.2)
BUN: 16 mg/dL (ref 6–20)
CO2: 28 mmol/L (ref 22–32)
CREATININE: 0.88 mg/dL (ref 0.44–1.00)
Calcium: 10 mg/dL (ref 8.9–10.3)
Chloride: 104 mmol/L (ref 101–111)
GFR calc Af Amer: >60 mL/min (ref >60)
GLUCOSE: 84 mg/dL (ref 65–99)
Potassium: 4.3 mmol/L (ref 3.5–5.1)
Sodium: 142 mmol/L (ref 135–145)
TOTAL PROTEIN: 7.8 g/dL (ref 6.5–8.1)

## 2017-06-10 LAB — CBC
HEMATOCRIT: 44.5 % (ref 36.0–46.0)
HEMOGLOBIN: 15 g/dL (ref 12.0–15.0)
MCH: 30.7 pg (ref 26.0–34.0)
MCHC: 33.7 g/dL (ref 30.0–36.0)
MCV: 91 fL (ref 78.0–100.0)
Platelets: 232 10*3/uL (ref 150–400)
RBC: 4.89 MIL/uL (ref 3.87–5.11)
RDW: 13.4 % (ref 11.5–15.5)
WBC: 7 10*3/uL (ref 4.0–10.5)

## 2017-06-10 LAB — SURGICAL PCR SCREEN
MRSA, PCR: NEGATIVE
STAPHYLOCOCCUS AUREUS: NEGATIVE

## 2017-06-10 LAB — ABO/RH: ABO/RH(D): A POS

## 2017-06-10 LAB — PROTIME-INR
INR: 0.93
PROTHROMBIN TIME: 12.4 s (ref 11.4–15.2)

## 2017-06-10 LAB — APTT: aPTT: 30 seconds (ref 24–36)

## 2017-06-16 ENCOUNTER — Ambulatory Visit: Payer: Self-pay | Admitting: Orthopedic Surgery

## 2017-06-16 NOTE — H&P (Signed)
[name] DOB: 02/24/1951 Married / Language: English / Race: White Female Date of Admission:  06/17/17 CC:  Right knee pain History of Present Illness The patient is a 66 year old female who comes in  for a preoperative History and Physical. The patient is scheduled for a right total knee arthroplasty to be performed by Dr. [name]. Aluisio, MD at [facility] on 06-17-2017. The patient is a 66 year old female who presented for follow up of their knee. The patient is being followed for their right knee pain and osteoarthritis. They are now months out from cortisone injection. Symptoms reported include: pain, aching, popping, grinding, giving way and pain with weightbearing. The patient feels that they are doing poorly and report their pain level to be moderate. The following medication has been used for pain control: Hydrocodone (for back issues) and Tylenol. She does have osteoarthritis in the right knee. She has had cortisone injections in the past. They only helped her for a week or two. She is now ready to proceed with knee replacement at this time. They have been treated conservatively in the past for the above stated problem and despite conservative measures, they continue to have progressive pain and severe functional limitations and dysfunction. They have failed non-operative management including home exercise, medications, and injections. It is felt that they would benefit from undergoing total joint replacement. Risks and benefits of the procedure have been discussed with the patient and they elect to proceed with surgery. There are no active contraindications to surgery such as ongoing infection or rapidly progressive neurological disease.    Problem List/Past Medical  Trochanteric bursitis of left hip (M70.62)  Impingement syndrome of right shoulder (M75.41)  Primary osteoarthritis of first carpometacarpal joint of left hand (M18.12)  Degenerative lumbar disc (M51.36)   Primary osteoarthritis of right knee (M17.11)  Migraine Headache  Depression  Skin Cancer  Degenerative Disc Disease  Bursitis  Staphlococcus Infections  Menopause   Allergies  NSAIDs  intolerance  Family History Mother  Deceased. age 18, HTN, Heart Attack Father  Deceased. age 8, Liver and Stomach Cancer Siblings  Sister deceased with Breast Cancer, Sister living with HTN, Psoriasis, High Cholesterol, Brother deceased with DM, HTN, High Cholesterol, Brother deceased accidental eletrocution, Brother living with HTN, High Cholesterol, hip and knee replacements  Social History  Tobacco use  Never smoker. Alcohol use  Currently drinks alcohol. rarely; cause migraines Exercise  Light, Moderate, 2 x week. Marital status  Married. Living situation  Lives with spouse. Current work status  Retired. Post-Surgical Plans  Home With Family, Home, Straight to Outpatient Therapy. Advance Directives  Living Will, Healthcare Power of [place].  Medication History Lomotil (2.5-0.025MG  Tablet, Oral as needed) Active. Atorvastatin Calcium (20MG  Tablet, Oral) Active. Wellbutrin XL (300MG  Tablet ER 24HR, Oral) Active. Furosemide (40MG  Tablet, Oral) Active. Claritin (10MG  Tablet, Oral) Active. Citracal + D (Oral) Specific strength unknown - Active. Vitamin D (2000UNIT Tablet, Oral) Active. Phazyme (Oral) Specific strength unknown - Active. Salonpas Arthritis Pain Relief (External) Active. Hydrocodone-Acetaminophen (10-650MG  Tablet, Oral) Active. (on a rare occasion) Lomotil Active. Phenergan (25MG  Tablet, 1/4 to 1/2 tab Oral as needed) Active. Diclofenac Sodium (1% Gel, 1 (one) Gram Transdermal apply 3-4 times daily prn pain, Taken starting 05/10/2016) Active. Tylenol (Oral) Specific strength unknown - Active. Nasonex Active.  Past Surgical History Left Hip Bursectomy  Skin Surgery for Cancer  Ruptured Disc Surgery  Neck - Ruptured Disc Surgery   Colectomy  Right Total Knee  Bilateral Foot Bunion Surgery  Breast Augmentatoin  Breast Reduction and Removal of Implants  Abdominoplasty  D&C  Lasik Eye Surgery  Hysteroscopy with Polypectomy  Right Thumb Joint Fusion  Tonsils  Left Foot Foreign Object Removal  Wisdom Teeth Extraction     Review of Systems General Not Present- Chills, Fatigue, Fever, Memory Loss, Night Sweats, Weight Gain and Weight Loss. Skin Not Present- Eczema, Hives, Itching, Lesions and Rash. HEENT Present- Blurred Vision and Headache. Not Present- Dentures, Double Vision, Hearing Loss, Tinnitus and Visual Loss. Respiratory Present- Allergies and Shortness of breath with exertion. Not Present- Chronic Cough, Coughing up blood and Shortness of breath at rest. Cardiovascular Not Present- Chest Pain, Difficulty Breathing Lying Down, Murmur, Palpitations, Racing/skipping heartbeats and Swelling. Gastrointestinal Present- Belching, Bloating, Constipation, Diarrhea and Loss of bowel control. Not Present- Abdominal Pain, Bloody Stool, Difficulty Swallowing, Heartburn, Jaundice, Loss of appetitie, Nausea and Vomiting. Female Genitourinary Present- Urinating at Night. Not Present- Blood in Urine, Discharge, Flank Pain, Incontinence, Painful Urination, Urgency, Urinary frequency, Urinary Retention and Weak urinary stream. Musculoskeletal Present- Back Pain, Joint Pain, Joint Stiffness and Morning Stiffness. Not Present- Joint Swelling, Muscle Pain, Muscle Weakness and Spasms. Neurological Present- Decreased Memory, Dizziness and Headaches. Not Present- Blackout spells, Difficulty with balance, Paralysis, Tremor and Weakness. Psychiatric Present- Insomnia. Endocrine Present- Heat Intolerance.  Vitals Weight: 180 lb Height: 67in Weight was reported by patient. Height was reported by patient. Body Surface Area: 1.93 m Body Mass Index: 28.19 kg/m  Pulse: 56 (Regular)  BP: 132/74 (Sitting, Right  Arm, Standard)   Physical Exam  General Mental Status -Alert, cooperative and good historian. General Appearance-pleasant, Not in acute distress. Orientation-Oriented X3. Build & Nutrition-Well nourished and Well developed.  Head and Neck Head-normocephalic, atraumatic . Neck Global Assessment - supple, no bruit auscultated on the right, no bruit auscultated on the left.  Eye Pupil - Bilateral-Regular and Round. Motion - Bilateral-EOMI.  Chest and Lung Exam Auscultation Breath sounds - clear at anterior chest wall and clear at posterior chest wall. Adventitious sounds - No Adventitious sounds.  Cardiovascular Auscultation Rhythm - Regular rate and rhythm(with occasional skipped or ectopic beat). Heart Sounds - S1 WNL and S2 WNL. Murmurs & Other Heart Sounds - Auscultation of the heart reveals - No Murmurs.  Abdomen Palpation/Percussion Tenderness - Abdomen is non-tender to palpation. Rigidity (guarding) - Abdomen is soft. Auscultation Auscultation of the abdomen reveals - Bowel sounds normal.  Female Genitourinary Note: Not done, not pertinent to present illness   Musculoskeletal Note: She is in no acute distress. Range of motion is 5 to 120. She does have significant patellofemoral crepitus. She is tender along the medial and lateral joint lines. No effusion or instability. Distal pulses 2+.   Assessment & Plan  Primary osteoarthritis of one knee, right (M17.11)  Note:Surgical Plans: Right Total Knee Replacement  Disposition: Home, straight to outpatient on Friday 06/21/17  PCP: Dr. Sandria Senter  IV TXA  Anesthesia Issues: None  Patient was instructed on what medications to stop prior to surgery.  Signed electronically by Joelene Millin, III PA-C

## 2017-06-17 ENCOUNTER — Inpatient Hospital Stay (HOSPITAL_COMMUNITY)
Admission: RE | Admit: 2017-06-17 | Discharge: 2017-06-19 | DRG: 470 | Disposition: A | Discharge status: Home / Self Care | Payer: Medicare Other | Source: Ambulatory Visit | Attending: Orthopedic Surgery | Admitting: Orthopedic Surgery

## 2017-06-17 ENCOUNTER — Inpatient Hospital Stay (HOSPITAL_COMMUNITY): Payer: Medicare Other | Admitting: Anesthesiology

## 2017-06-17 ENCOUNTER — Encounter (HOSPITAL_COMMUNITY): Payer: Self-pay | Admitting: Anesthesiology

## 2017-06-17 ENCOUNTER — Encounter (HOSPITAL_COMMUNITY)
Admission: RE | Disposition: A | Discharge status: Home / Self Care | Payer: Self-pay | Source: Ambulatory Visit | Attending: Orthopedic Surgery

## 2017-06-17 DIAGNOSIS — M19042 Primary osteoarthritis, left hand: Secondary | ICD-10-CM | POA: Diagnosis present

## 2017-06-17 DIAGNOSIS — M19041 Primary osteoarthritis, right hand: Secondary | ICD-10-CM | POA: Diagnosis present

## 2017-06-17 DIAGNOSIS — Z981 Arthrodesis status: Secondary | ICD-10-CM

## 2017-06-17 DIAGNOSIS — Z800 Family history of malignant neoplasm of digestive organs: Secondary | ICD-10-CM | POA: Diagnosis not present

## 2017-06-17 DIAGNOSIS — M179 Osteoarthritis of knee, unspecified: Secondary | ICD-10-CM | POA: Diagnosis present

## 2017-06-17 DIAGNOSIS — E785 Hyperlipidemia, unspecified: Secondary | ICD-10-CM | POA: Diagnosis present

## 2017-06-17 DIAGNOSIS — Z9049 Acquired absence of other specified parts of digestive tract: Secondary | ICD-10-CM | POA: Diagnosis not present

## 2017-06-17 DIAGNOSIS — Z888 Allergy status to other drugs, medicaments and biological substances status: Secondary | ICD-10-CM

## 2017-06-17 DIAGNOSIS — Z85828 Personal history of other malignant neoplasm of skin: Secondary | ICD-10-CM

## 2017-06-17 DIAGNOSIS — Z8489 Family history of other specified conditions: Secondary | ICD-10-CM

## 2017-06-17 DIAGNOSIS — M1710 Unilateral primary osteoarthritis, unspecified knee: Secondary | ICD-10-CM

## 2017-06-17 DIAGNOSIS — Z79899 Other long term (current) drug therapy: Secondary | ICD-10-CM

## 2017-06-17 DIAGNOSIS — G8918 Other acute postprocedural pain: Secondary | ICD-10-CM | POA: Diagnosis not present

## 2017-06-17 DIAGNOSIS — Z803 Family history of malignant neoplasm of breast: Secondary | ICD-10-CM

## 2017-06-17 DIAGNOSIS — G43909 Migraine, unspecified, not intractable, without status migrainosus: Secondary | ICD-10-CM | POA: Diagnosis not present

## 2017-06-17 DIAGNOSIS — M1711 Unilateral primary osteoarthritis, right knee: Secondary | ICD-10-CM | POA: Diagnosis present

## 2017-06-17 DIAGNOSIS — Z96659 Presence of unspecified artificial knee joint: Secondary | ICD-10-CM | POA: Insufficient documentation

## 2017-06-17 HISTORY — PX: TOTAL KNEE ARTHROPLASTY: SHX125

## 2017-06-17 LAB — TYPE AND SCREEN
ABO/RH(D): A POS
Antibody Screen: NEGATIVE

## 2017-06-17 SURGERY — ARTHROPLASTY, KNEE, TOTAL
Anesthesia: Spinal | Site: Knee | Laterality: Right

## 2017-06-17 MED ORDER — LACTATED RINGERS IV SOLN
INTRAVENOUS | Status: DC
  Administered 2017-06-17 (×2): via INTRAVENOUS

## 2017-06-17 MED ORDER — PROMETHAZINE HCL 12.5 MG PO TABS
6.2500 mg | ORAL_TABLET | Freq: Four times a day (QID) | ORAL | Status: DC | PRN
  Filled 2017-06-17: qty 1

## 2017-06-17 MED ORDER — BISACODYL 10 MG RE SUPP: 10.0000 mg | Freq: Every day | RECTAL | Status: DC | PRN

## 2017-06-17 MED ORDER — ROPIVACAINE HCL 5 MG/ML IJ SOLN
INTRAMUSCULAR | Status: DC | PRN
  Administered 2017-06-17: 20 mL via PERINEURAL

## 2017-06-17 MED ORDER — RIVAROXABAN 10 MG PO TABS
10.0000 mg | ORAL_TABLET | Freq: Every day | ORAL | Status: DC
  Administered 2017-06-18 – 2017-06-19 (×2): 10 mg via ORAL
  Filled 2017-06-17 (×2): qty 1

## 2017-06-17 MED ORDER — CHLORHEXIDINE GLUCONATE 4 % EX LIQD: 60.0000 mL | Freq: Once | CUTANEOUS | Status: DC

## 2017-06-17 MED ORDER — CEFAZOLIN SODIUM-DEXTROSE 2-4 GM/100ML-% IV SOLN
INTRAVENOUS | Status: AC
  Filled 2017-06-17: qty 100

## 2017-06-17 MED ORDER — PHENOL 1.4 % MT LIQD: 1.0000 | OROMUCOSAL | Status: DC | PRN

## 2017-06-17 MED ORDER — FENTANYL CITRATE (PF) 100 MCG/2ML IJ SOLN
50.0000 ug | INTRAMUSCULAR | Status: DC | PRN
  Administered 2017-06-17: 50 ug via INTRAVENOUS

## 2017-06-17 MED ORDER — SODIUM CHLORIDE 0.9 % IR SOLN
Status: DC | PRN
  Administered 2017-06-17: 1000 mL

## 2017-06-17 MED ORDER — DEXAMETHASONE SODIUM PHOSPHATE 10 MG/ML IJ SOLN
10.0000 mg | Freq: Once | INTRAMUSCULAR | Status: AC
  Administered 2017-06-18: 10 mg via INTRAVENOUS
  Filled 2017-06-17: qty 1

## 2017-06-17 MED ORDER — METHOCARBAMOL 1000 MG/10ML IJ SOLN
500.0000 mg | Freq: Four times a day (QID) | INTRAVENOUS | Status: DC | PRN
  Administered 2017-06-17: 500 mg via INTRAVENOUS
  Filled 2017-06-17: qty 550

## 2017-06-17 MED ORDER — KETOROLAC TROMETHAMINE 15 MG/ML IJ SOLN
15.0000 mg | Freq: Four times a day (QID) | INTRAMUSCULAR | Status: DC
  Administered 2017-06-17 – 2017-06-19 (×7): 15 mg via INTRAVENOUS
  Filled 2017-06-17 (×7): qty 1

## 2017-06-17 MED ORDER — GABAPENTIN 300 MG PO CAPS
300.0000 mg | ORAL_CAPSULE | Freq: Once | ORAL | Status: AC
  Administered 2017-06-17: 300 mg via ORAL

## 2017-06-17 MED ORDER — CEFAZOLIN SODIUM-DEXTROSE 2-4 GM/100ML-% IV SOLN
2.0000 g | Freq: Four times a day (QID) | INTRAVENOUS | Status: AC
  Administered 2017-06-17 (×2): 2 g via INTRAVENOUS
  Filled 2017-06-17 (×2): qty 100

## 2017-06-17 MED ORDER — OXYCODONE HCL 5 MG PO TABS
5.0000 mg | ORAL_TABLET | ORAL | Status: DC | PRN
  Administered 2017-06-17 – 2017-06-18 (×2): 10 mg via ORAL
  Filled 2017-06-17 (×3): qty 2

## 2017-06-17 MED ORDER — ONDANSETRON HCL 4 MG/2ML IJ SOLN
INTRAMUSCULAR | Status: AC
  Filled 2017-06-17: qty 2

## 2017-06-17 MED ORDER — ACETAMINOPHEN 650 MG RE SUPP
650.0000 mg | Freq: Four times a day (QID) | RECTAL | Status: DC | PRN
  Administered 2017-06-18: 650 mg via RECTAL

## 2017-06-17 MED ORDER — DIPHENHYDRAMINE HCL 12.5 MG/5ML PO ELIX: 12.5000 mg | ORAL_SOLUTION | ORAL | Status: DC | PRN

## 2017-06-17 MED ORDER — MORPHINE SULFATE (PF) 4 MG/ML IV SOLN
1.0000 mg | INTRAVENOUS | Status: DC | PRN
  Administered 2017-06-17 (×2): 1 mg via INTRAVENOUS
  Filled 2017-06-17 (×2): qty 1

## 2017-06-17 MED ORDER — SODIUM CHLORIDE 0.9 % IJ SOLN
INTRAMUSCULAR | Status: DC | PRN
  Administered 2017-06-17: 60 mL

## 2017-06-17 MED ORDER — ONDANSETRON HCL 4 MG/2ML IJ SOLN
INTRAMUSCULAR | Status: DC | PRN
  Administered 2017-06-17: 4 mg via INTRAVENOUS

## 2017-06-17 MED ORDER — OXYCODONE HCL 5 MG/5ML PO SOLN: 5.0000 mg | Freq: Once | ORAL | Status: DC | PRN

## 2017-06-17 MED ORDER — TRANEXAMIC ACID 1000 MG/10ML IV SOLN
1000.0000 mg | INTRAVENOUS | Status: AC
  Administered 2017-06-17: 1000 mg via INTRAVENOUS
  Filled 2017-06-17: qty 10

## 2017-06-17 MED ORDER — LORATADINE 10 MG PO TABS
10.0000 mg | ORAL_TABLET | Freq: Every day | ORAL | Status: DC
  Administered 2017-06-18 – 2017-06-19 (×2): 10 mg via ORAL
  Filled 2017-06-17 (×2): qty 1

## 2017-06-17 MED ORDER — ACETAMINOPHEN 325 MG PO TABS
650.0000 mg | ORAL_TABLET | Freq: Four times a day (QID) | ORAL | Status: DC | PRN
  Filled 2017-06-17: qty 2

## 2017-06-17 MED ORDER — FENTANYL CITRATE (PF) 100 MCG/2ML IJ SOLN
INTRAMUSCULAR | Status: AC
  Administered 2017-06-17: 50 ug via INTRAVENOUS
  Filled 2017-06-17: qty 2

## 2017-06-17 MED ORDER — ACETAMINOPHEN 10 MG/ML IV SOLN
INTRAVENOUS | Status: AC
  Filled 2017-06-17: qty 100

## 2017-06-17 MED ORDER — GABAPENTIN 300 MG PO CAPS
ORAL_CAPSULE | ORAL | Status: AC
  Filled 2017-06-17: qty 1

## 2017-06-17 MED ORDER — DIPHENOXYLATE-ATROPINE 2.5-0.025 MG PO TABS: 1.0000 | ORAL_TABLET | Freq: Four times a day (QID) | ORAL | Status: DC | PRN

## 2017-06-17 MED ORDER — SODIUM CHLORIDE 0.9 % IJ SOLN
INTRAMUSCULAR | Status: AC
  Filled 2017-06-17: qty 50

## 2017-06-17 MED ORDER — METHOCARBAMOL 500 MG PO TABS
500.0000 mg | ORAL_TABLET | Freq: Four times a day (QID) | ORAL | Status: DC | PRN
  Administered 2017-06-17 – 2017-06-19 (×4): 500 mg via ORAL
  Filled 2017-06-17 (×4): qty 1

## 2017-06-17 MED ORDER — METOCLOPRAMIDE HCL 5 MG PO TABS: 5.0000 mg | ORAL_TABLET | Freq: Three times a day (TID) | ORAL | Status: DC | PRN

## 2017-06-17 MED ORDER — BUPIVACAINE LIPOSOME 1.3 % IJ SUSP
20.0000 mL | Freq: Once | INTRAMUSCULAR | Status: DC
  Filled 2017-06-17: qty 20

## 2017-06-17 MED ORDER — BUPROPION HCL ER (XL) 300 MG PO TB24
300.0000 mg | ORAL_TABLET | Freq: Every day | ORAL | Status: DC
  Administered 2017-06-18 – 2017-06-19 (×2): 300 mg via ORAL
  Filled 2017-06-17 (×2): qty 1

## 2017-06-17 MED ORDER — PROPOFOL 10 MG/ML IV BOLUS
INTRAVENOUS | Status: AC
  Filled 2017-06-17: qty 20

## 2017-06-17 MED ORDER — ONDANSETRON HCL 4 MG PO TABS: 4.0000 mg | ORAL_TABLET | Freq: Four times a day (QID) | ORAL | Status: DC | PRN

## 2017-06-17 MED ORDER — CEFAZOLIN SODIUM-DEXTROSE 2-4 GM/100ML-% IV SOLN
2.0000 g | INTRAVENOUS | Status: AC
  Administered 2017-06-17: 2 g via INTRAVENOUS

## 2017-06-17 MED ORDER — LACTATED RINGERS IV SOLN
INTRAVENOUS | Status: DC
  Administered 2017-06-17: 08:00:00 via INTRAVENOUS

## 2017-06-17 MED ORDER — DOCUSATE SODIUM 100 MG PO CAPS
100.0000 mg | ORAL_CAPSULE | Freq: Two times a day (BID) | ORAL | Status: DC
  Administered 2017-06-18 – 2017-06-19 (×3): 100 mg via ORAL
  Filled 2017-06-17 (×5): qty 1

## 2017-06-17 MED ORDER — PHENYLEPHRINE HCL 10 MG/ML IJ SOLN
INTRAMUSCULAR | Status: AC
  Filled 2017-06-17: qty 1

## 2017-06-17 MED ORDER — DEXAMETHASONE SODIUM PHOSPHATE 10 MG/ML IJ SOLN
INTRAMUSCULAR | Status: AC
  Filled 2017-06-17: qty 1

## 2017-06-17 MED ORDER — ONDANSETRON HCL 4 MG/2ML IJ SOLN: 4.0000 mg | Freq: Four times a day (QID) | INTRAMUSCULAR | Status: DC | PRN

## 2017-06-17 MED ORDER — MIDAZOLAM HCL 2 MG/2ML IJ SOLN
1.0000 mg | INTRAMUSCULAR | Status: DC | PRN
  Administered 2017-06-17: 2 mg via INTRAVENOUS

## 2017-06-17 MED ORDER — HYDROMORPHONE HCL-NACL 0.5-0.9 MG/ML-% IV SOSY
1.0000 mg | PREFILLED_SYRINGE | INTRAVENOUS | Status: DC | PRN
  Administered 2017-06-17 – 2017-06-18 (×3): 1 mg via INTRAVENOUS
  Filled 2017-06-17 (×4): qty 2

## 2017-06-17 MED ORDER — PROPOFOL 500 MG/50ML IV EMUL
INTRAVENOUS | Status: DC | PRN
Start: 2017-06-17 — End: 2017-06-17
  Administered 2017-06-17: 50 ug/kg/min via INTRAVENOUS

## 2017-06-17 MED ORDER — OXYCODONE HCL 5 MG PO TABS: 5.0000 mg | ORAL_TABLET | Freq: Once | ORAL | Status: DC | PRN

## 2017-06-17 MED ORDER — FUROSEMIDE 40 MG PO TABS
40.0000 mg | ORAL_TABLET | Freq: Every day | ORAL | Status: DC
Start: 2017-06-18 — End: 2017-06-19
  Administered 2017-06-17 – 2017-06-19 (×3): 40 mg via ORAL
  Filled 2017-06-17 (×3): qty 1

## 2017-06-17 MED ORDER — FLEET ENEMA 7-19 GM/118ML RE ENEM: 1.0000 | ENEMA | Freq: Once | RECTAL | Status: DC | PRN

## 2017-06-17 MED ORDER — BUPIVACAINE LIPOSOME 1.3 % IJ SUSP
INTRAMUSCULAR | Status: DC | PRN
  Administered 2017-06-17: 20 mL

## 2017-06-17 MED ORDER — SODIUM CHLORIDE 0.9 % IV SOLN
1000.0000 mg | Freq: Once | INTRAVENOUS | Status: AC
  Administered 2017-06-17: 1000 mg via INTRAVENOUS
  Filled 2017-06-17: qty 1100

## 2017-06-17 MED ORDER — ACETAMINOPHEN 500 MG PO TABS
1000.0000 mg | ORAL_TABLET | Freq: Four times a day (QID) | ORAL | Status: AC
  Administered 2017-06-17 – 2017-06-18 (×4): 1000 mg via ORAL
  Filled 2017-06-17 (×5): qty 2

## 2017-06-17 MED ORDER — HYDROMORPHONE HCL-NACL 0.5-0.9 MG/ML-% IV SOSY: 0.2500 mg | PREFILLED_SYRINGE | INTRAVENOUS | Status: DC | PRN

## 2017-06-17 MED ORDER — PROMETHAZINE HCL 12.5 MG PO TABS
6.2500 mg | ORAL_TABLET | Freq: Four times a day (QID) | ORAL | Status: DC | PRN
  Filled 2017-06-17 (×2): qty 1

## 2017-06-17 MED ORDER — METOCLOPRAMIDE HCL 5 MG/ML IJ SOLN: 5.0000 mg | Freq: Three times a day (TID) | INTRAMUSCULAR | Status: DC | PRN

## 2017-06-17 MED ORDER — MENTHOL 3 MG MT LOZG: 1.0000 | LOZENGE | OROMUCOSAL | Status: DC | PRN

## 2017-06-17 MED ORDER — SODIUM CHLORIDE 0.9 % IV SOLN
INTRAVENOUS | Status: DC
  Administered 2017-06-17 – 2017-06-18 (×2): via INTRAVENOUS

## 2017-06-17 MED ORDER — PROPOFOL 10 MG/ML IV BOLUS
INTRAVENOUS | Status: AC
  Filled 2017-06-17: qty 40

## 2017-06-17 MED ORDER — MIDAZOLAM HCL 2 MG/2ML IJ SOLN
INTRAMUSCULAR | Status: AC
  Administered 2017-06-17: 2 mg via INTRAVENOUS
  Filled 2017-06-17: qty 2

## 2017-06-17 MED ORDER — SODIUM CHLORIDE 0.9 % IJ SOLN
INTRAMUSCULAR | Status: AC
  Filled 2017-06-17: qty 10

## 2017-06-17 MED ORDER — ATORVASTATIN CALCIUM 20 MG PO TABS
20.0000 mg | ORAL_TABLET | Freq: Every morning | ORAL | Status: DC
  Administered 2017-06-18 – 2017-06-19 (×2): 20 mg via ORAL
  Filled 2017-06-17 (×2): qty 1

## 2017-06-17 MED ORDER — PROMETHAZINE HCL 25 MG/ML IJ SOLN: 6.2500 mg | INTRAMUSCULAR | Status: DC | PRN

## 2017-06-17 MED ORDER — ACETAMINOPHEN 10 MG/ML IV SOLN
1000.0000 mg | Freq: Once | INTRAVENOUS | Status: AC
  Administered 2017-06-17: 1000 mg via INTRAVENOUS

## 2017-06-17 MED ORDER — BUPIVACAINE IN DEXTROSE 0.75-8.25 % IT SOLN
INTRATHECAL | Status: DC | PRN
  Administered 2017-06-17: 2 mL via INTRATHECAL

## 2017-06-17 MED ORDER — TRAMADOL HCL 50 MG PO TABS
50.0000 mg | ORAL_TABLET | Freq: Four times a day (QID) | ORAL | Status: DC | PRN
  Administered 2017-06-19: 100 mg via ORAL
  Filled 2017-06-17: qty 2

## 2017-06-17 MED ORDER — PHENYLEPHRINE HCL 10 MG/ML IJ SOLN
INTRAVENOUS | Status: DC | PRN
  Administered 2017-06-17: 40 ug/min via INTRAVENOUS

## 2017-06-17 MED ORDER — DEXAMETHASONE SODIUM PHOSPHATE 10 MG/ML IJ SOLN
10.0000 mg | Freq: Once | INTRAMUSCULAR | Status: AC
  Administered 2017-06-17: 10 mg via INTRAVENOUS

## 2017-06-17 MED ORDER — POLYETHYLENE GLYCOL 3350 17 G PO PACK
17.0000 g | PACK | Freq: Every day | ORAL | Status: DC | PRN
  Administered 2017-06-19: 17 g via ORAL
  Filled 2017-06-17: qty 1

## 2017-06-17 SURGICAL SUPPLY — 49 items
BAG DECANTER FOR FLEXI CONT (MISCELLANEOUS) ×2 IMPLANT
BAG ZIPLOCK 12X15 (MISCELLANEOUS) ×2 IMPLANT
BANDAGE ACE 6X5 VEL STRL LF (GAUZE/BANDAGES/DRESSINGS) ×2 IMPLANT
BLADE SAG 18X100X1.27 (BLADE) ×2 IMPLANT
BLADE SAW SGTL 11.0X1.19X90.0M (BLADE) ×2 IMPLANT
BOWL SMART MIX CTS (DISPOSABLE) ×2 IMPLANT
CAPT KNEE TOTAL 3 ATTUNE ×2 IMPLANT
CEMENT HV SMART SET (Cement) ×4 IMPLANT
COVER SURGICAL LIGHT HANDLE (MISCELLANEOUS) ×2 IMPLANT
CUFF TOURN SGL QUICK 34 (TOURNIQUET CUFF) ×1
CUFF TRNQT CYL 34X4X40X1 (TOURNIQUET CUFF) ×1 IMPLANT
DECANTER SPIKE VIAL GLASS SM (MISCELLANEOUS) ×2 IMPLANT
DRAPE U-SHAPE 47X51 STRL (DRAPES) ×2 IMPLANT
DRSG ADAPTIC 3X8 NADH LF (GAUZE/BANDAGES/DRESSINGS) ×2 IMPLANT
DRSG PAD ABDOMINAL 8X10 ST (GAUZE/BANDAGES/DRESSINGS) ×2 IMPLANT
DURAPREP 26ML APPLICATOR (WOUND CARE) ×2 IMPLANT
ELECT REM PT RETURN 15FT ADLT (MISCELLANEOUS) ×2 IMPLANT
EVACUATOR 1/8 PVC DRAIN (DRAIN) ×2 IMPLANT
GAUZE SPONGE 4X4 12PLY STRL (GAUZE/BANDAGES/DRESSINGS) ×2 IMPLANT
GLOVE BIO SURGEON STRL SZ7.5 (GLOVE) IMPLANT
GLOVE BIO SURGEON STRL SZ8 (GLOVE) ×2 IMPLANT
GLOVE BIOGEL PI IND STRL 6.5 (GLOVE) IMPLANT
GLOVE BIOGEL PI IND STRL 8 (GLOVE) ×1 IMPLANT
GLOVE BIOGEL PI INDICATOR 6.5 (GLOVE)
GLOVE BIOGEL PI INDICATOR 8 (GLOVE) ×1
GLOVE SURG SS PI 6.5 STRL IVOR (GLOVE) IMPLANT
GOWN STRL REUS W/TWL LRG LVL3 (GOWN DISPOSABLE) ×2 IMPLANT
GOWN STRL REUS W/TWL XL LVL3 (GOWN DISPOSABLE) IMPLANT
HANDPIECE INTERPULSE COAX TIP (DISPOSABLE) ×1
IMMOBILIZER KNEE 20 (SOFTGOODS) ×2
IMMOBILIZER KNEE 20 THIGH 36 (SOFTGOODS) ×1 IMPLANT
MANIFOLD NEPTUNE II (INSTRUMENTS) ×2 IMPLANT
NS IRRIG 1000ML POUR BTL (IV SOLUTION) ×2 IMPLANT
PACK TOTAL KNEE CUSTOM (KITS) ×2 IMPLANT
PAD ABD 8X10 STRL (GAUZE/BANDAGES/DRESSINGS) ×2 IMPLANT
PADDING CAST COTTON 6X4 STRL (CAST SUPPLIES) ×4 IMPLANT
POSITIONER SURGICAL ARM (MISCELLANEOUS) ×2 IMPLANT
SET HNDPC FAN SPRY TIP SCT (DISPOSABLE) ×1 IMPLANT
STRIP CLOSURE SKIN 1/2X4 (GAUZE/BANDAGES/DRESSINGS) ×2 IMPLANT
SUT MNCRL AB 4-0 PS2 18 (SUTURE) ×2 IMPLANT
SUT STRATAFIX 0 PDS 27 VIOLET (SUTURE) ×2
SUT VIC AB 2-0 CT1 27 (SUTURE) ×3
SUT VIC AB 2-0 CT1 TAPERPNT 27 (SUTURE) ×3 IMPLANT
SUTURE STRATFX 0 PDS 27 VIOLET (SUTURE) ×1 IMPLANT
SYR 30ML LL (SYRINGE) IMPLANT
TRAY FOLEY W/METER SILVER 16FR (SET/KITS/TRAYS/PACK) ×2 IMPLANT
WATER STERILE IRR 1000ML POUR (IV SOLUTION) ×4 IMPLANT
WRAP KNEE MAXI GEL POST OP (GAUZE/BANDAGES/DRESSINGS) ×2 IMPLANT
YANKAUER SUCT BULB TIP 10FT TU (MISCELLANEOUS) ×2 IMPLANT

## 2017-06-17 NOTE — Progress Notes (Signed)
AssistedDr. Miller with right, ultrasound guided, adductor canal block. Side rails up, monitors on throughout procedure. See vital signs in flow sheet. Tolerated Procedure well.  

## 2017-06-17 NOTE — Discharge Instructions (Addendum)
°  ° °Dr. Frank Aluisio °Total Joint Specialist °[facility] °[address] °[place], [place] [address] °[phone] ° °TOTAL KNEE REPLACEMENT POSTOPERATIVE DIRECTIONS ° °Knee Rehabilitation, Guidelines Following Surgery  °Results after knee surgery are often greatly improved when you follow the exercise, range of motion and muscle strengthening exercises prescribed by your doctor. Safety measures are also important to protect the knee from further injury. Any time any of these exercises cause you to have increased pain or swelling in your knee joint, decrease the amount until you are comfortable again and slowly increase them. If you have problems or questions, call your caregiver or physical therapist for advice.  ° °HOME CARE INSTRUCTIONS  °Remove items at home which could result in a fall. This includes throw rugs or furniture in walking pathways.  °· ICE to the affected knee every three hours for 30 minutes at a time and then as needed for pain and swelling.  Continue to use ice on the knee for pain and swelling from surgery. You may notice swelling that will progress down to the foot and ankle.  This is normal after surgery.  Elevate the leg when you are not up walking on it.   °· Continue to use the breathing machine which will help keep your temperature down.  It is common for your temperature to cycle up and down following surgery, especially at night when you are not up moving around and exerting yourself.  The breathing machine keeps your lungs expanded and your temperature down. °· Do not place pillow under knee, focus on keeping the knee straight while resting ° °DIET °You may resume your previous home diet once your are discharged from the hospital. ° °DRESSING / WOUND CARE / SHOWERING °You may shower 3 days after surgery, but keep the wounds dry during showering.  You may use an occlusive plastic wrap (Press'n Seal for example), NO SOAKING/SUBMERGING IN THE BATHTUB.  If the  bandage gets wet, change with a clean dry gauze.  If the incision gets wet, pat the wound dry with a clean towel. °You may start showering once you are discharged home but do not submerge the incision under water. Just pat the incision dry and apply a dry gauze dressing on daily. °Change the surgical dressing daily and reapply a dry dressing each time. ° °ACTIVITY °Walk with your walker as instructed. °Use walker as long as suggested by your caregivers. °Avoid periods of inactivity such as sitting longer than an hour when not asleep. This helps prevent blood clots.  °You may resume a sexual relationship in one month or when given the OK by your doctor.  °You may return to work once you are cleared by your doctor.  °Do not drive a car for 6 weeks or until released by you surgeon.  °Do not drive while taking narcotics. ° °WEIGHT BEARING °Weight bearing as tolerated with assist device (walker, cane, etc) as directed, use it as long as suggested by your surgeon or therapist, typically at least 4-6 weeks. ° °POSTOPERATIVE CONSTIPATION PROTOCOL °Constipation - defined medically as fewer than three stools per week and severe constipation as less than one stool per week. ° °One of the most common issues patients have following surgery is constipation.  Even if you have a regular bowel pattern at home, your normal regimen is likely to be disrupted due to multiple reasons following surgery.  Combination of anesthesia, postoperative narcotics, change in appetite and fluid intake all can affect your   bowels.  In order to avoid complications following surgery, here are some recommendations in order to help you during your recovery period. ° °Colace (docusate) - Pick up an over-the-counter form of Colace or another stool softener and take twice a day as long as you are requiring postoperative pain medications.  Take with a full glass of water daily.  If you experience loose stools or diarrhea, hold the colace until you stool forms  back up.  If your symptoms do not get better within 1 week or if they get worse, check with your doctor. ° °Dulcolax (bisacodyl) - Pick up over-the-counter and take as directed by the product packaging as needed to assist with the movement of your bowels.  Take with a full glass of water.  Use this product as needed if not relieved by Colace only.  ° °MiraLax (polyethylene glycol) - Pick up over-the-counter to have on hand.  MiraLax is a solution that will increase the amount of water in your bowels to assist with bowel movements.  Take as directed and can mix with a glass of water, juice, soda, coffee, or tea.  Take if you go more than two days without a movement. °Do not use MiraLax more than once per day. Call your doctor if you are still constipated or irregular after using this medication for 7 days in a row. ° °If you continue to have problems with postoperative constipation, please contact the office for further assistance and recommendations.  If you experience "the worst abdominal pain ever" or develop nausea or vomiting, please contact the office immediatly for further recommendations for treatment. ° °ITCHING ° If you experience itching with your medications, try taking only a single pain pill, or even half a pain pill at a time.  You can also use Benadryl over the counter for itching or also to help with sleep.  ° °TED HOSE STOCKINGS °Wear the elastic stockings on both legs for three weeks following surgery during the day but you may remove then at night for sleeping. ° °MEDICATIONS °See your medication summary on the “After Visit Summary” that the nursing staff will review with you prior to discharge.  You may have some home medications which will be placed on hold until you complete the course of blood thinner medication.  It is important for you to complete the blood thinner medication as prescribed by your surgeon.  Continue your approved medications as instructed at time of  discharge. ° °PRECAUTIONS °If you experience chest pain or shortness of breath - call 911 immediately for transfer to the hospital emergency department.  °If you develop a fever greater that 101 F, purulent drainage from wound, increased redness or drainage from wound, foul odor from the wound/dressing, or calf pain - CONTACT YOUR SURGEON.   °                                                °FOLLOW-UP APPOINTMENTS °Make sure you keep all of your appointments after your operation with your surgeon and caregivers. You should call the office at the above phone number and make an appointment for approximately two weeks after the date of your surgery or on the date instructed by your surgeon outlined in the "After Visit Summary". ° ° °RANGE OF MOTION AND STRENGTHENING EXERCISES  °Rehabilitation of the knee is important following a knee   injury or an operation. After just a few days of immobilization, the muscles of the thigh which control the knee become weakened and shrink (atrophy). Knee exercises are designed to build up the tone and strength of the thigh muscles and to improve knee motion. Often times heat used for twenty to thirty minutes before working out will loosen up your tissues and help with improving the range of motion but do not use heat for the first two weeks following surgery. These exercises can be done on a training (exercise) mat, on the floor, on a table or on a bed. Use what ever works the best and is most comfortable for you Knee exercises include:  °Leg Lifts - While your knee is still immobilized in a splint or cast, you can do straight leg raises. Lift the leg to 60 degrees, hold for 3 sec, and slowly lower the leg. Repeat 10-20 times 2-3 times daily. Perform this exercise against resistance later as your knee gets better.  °Quad and Hamstring Sets - Tighten up the muscle on the front of the thigh (Quad) and hold for 5-10 sec. Repeat this 10-20 times hourly. Hamstring sets are done by pushing the  foot backward against an object and holding for 5-10 sec. Repeat as with quad sets.  °· Leg Slides: Lying on your back, slowly slide your foot toward your buttocks, bending your knee up off the floor (only go as far as is comfortable). Then slowly slide your foot back down until your leg is flat on the floor again. °· Angel Wings: Lying on your back spread your legs to the side as far apart as you can without causing discomfort.  °A rehabilitation program following serious knee injuries can speed recovery and prevent re-injury in the future due to weakened muscles. Contact your doctor or a physical therapist for more information on knee rehabilitation.  ° °IF YOU ARE TRANSFERRED TO A SKILLED REHAB FACILITY °If the patient is transferred to a skilled rehab facility following release from the hospital, a list of the current medications will be sent to the facility for the patient to continue.  When discharged from the skilled rehab facility, please have the facility set up the patient's Home Health Physical Therapy prior to being released. Also, the skilled facility will be responsible for providing the patient with their medications at time of release from the facility to include their pain medication, the muscle relaxants, and their blood thinner medication. If the patient is still at the rehab facility at time of the two week follow up appointment, the skilled rehab facility will also need to assist the patient in arranging follow up appointment in our office and any transportation needs. ° °MAKE SURE YOU:  °Understand these instructions.  °Get help right away if you are not doing well or get worse.  ° ° °Pick up stool softner and laxative for home use following surgery while on pain medications. °Do not submerge incision under water. °Please use good hand washing techniques while changing dressing each day. °May shower starting three days after surgery. °Please use a clean towel to pat the incision dry following  showers. °Continue to use ice for pain and swelling after surgery. °Do not use any lotions or creams on the incision until instructed by your surgeon. ° °Take Xarelto for two and a half more weeks following discharge from the hospital, then discontinue Xarelto. °Once the patient has completed the blood thinner regimen, then take a Baby 81 mg Aspirin daily   for three more weeks. ° ° °Information on my medicine - XARELTO® (Rivaroxaban) ° ° °Why was Xarelto® prescribed for you? °Xarelto® was prescribed for you to reduce the risk of blood clots forming after orthopedic surgery. The medical term for these abnormal blood clots is venous thromboembolism (VTE). ° °What do you need to know about xarelto® ? °Take your Xarelto® ONCE DAILY at the same time every day. °You may take it either with or without food. ° °If you have difficulty swallowing the tablet whole, you may crush it and mix in applesauce just prior to taking your dose. ° °Take Xarelto® exactly as prescribed by your doctor and DO NOT stop taking Xarelto® without talking to the doctor who prescribed the medication.  Stopping without other VTE prevention medication to take the place of Xarelto® may increase your risk of developing a clot. ° °After discharge, you should have regular check-up appointments with your healthcare provider that is prescribing your Xarelto®.   ° °What do you do if you miss a dose? °If you miss a dose, take it as soon as you remember on the same day then continue your regularly scheduled once daily regimen the next day. Do not take two doses of Xarelto® on the same day.  ° °Important Safety Information °A possible side effect of Xarelto® is bleeding. You should call your healthcare provider right away if you experience any of the following: °? Bleeding from an injury or your nose that does not stop. °? Unusual colored urine (red or dark brown) or unusual colored stools (red or black). °? Unusual bruising for unknown reasons. °? A serious  fall or if you hit your head (even if there is no bleeding). ° °Some medicines may interact with Xarelto® and might increase your risk of bleeding while on Xarelto®. To help avoid this, consult your healthcare provider or pharmacist prior to using any new prescription or non-prescription medications, including herbals, vitamins, non-steroidal anti-inflammatory drugs (NSAIDs) and supplements. ° °This website has more information on Xarelto®: www.xarelto.com. ° ° °

## 2017-06-17 NOTE — Interval H&P Note (Signed)
History and Physical Interval Note:  06/17/2017 7:02 AM  [name]  has presented today for surgery, with the diagnosis of Right knee osteoarthritis   The various methods of treatment have been discussed with the patient and family. After consideration of risks, benefits and other options for treatment, the patient has consented to  Procedure(s): RIGHT TOTAL KNEE ARTHROPLASTY (Right) as a surgical intervention .  The patient's history has been reviewed, patient examined, no change in status, stable for surgery.  I have reviewed the patient's chart and labs.  Questions were answered to the patient's satisfaction.     [name]

## 2017-06-17 NOTE — Op Note (Signed)
OPERATIVE REPORT-TOTAL KNEE ARTHROPLASTY   Pre-operative diagnosis- Osteoarthritis  Right knee(s)  Post-operative diagnosis- Osteoarthritis Right knee(s)  Procedure-  Right  Total Knee Arthroplasty  Surgeon- [name]. Kenasia Scheller, MD  Assistant- Ardeen Jourdain, PA-C   Anesthesia-  Adductor canal block and spinal  EBL-* No blood loss amount entered *   Drains Hemovac  Tourniquet time- 34 minutes @ 371 mm Hg   Complications- None  Condition-PACU - hemodynamically stable.   Brief Clinical Note  [name] is a 66 y.o. year old female with end stage OA of her right knee with progressively worsening pain and dysfunction. She has constant pain, with activity and at rest and significant functional deficits with difficulties even with ADLs. She has had extensive non-op management including analgesics, injections of cortisone and viscosupplements, and home exercise program, but remains in significant pain with significant dysfunction.Radiographs show bone on bone arthritis medial and patellofemoral. She presents now for right Total Knee Arthroplasty.    Procedure in detail---   The patient is brought into the operating room and positioned supine on the operating table. After successful administration of  Adductor canal block and spinal,   a tourniquet is placed high on the  Right thigh(s) and the lower extremity is prepped and draped in the usual sterile fashion. Time out is performed by the operating team and then the  Right lower extremity is wrapped in Esmarch, knee flexed and the tourniquet inflated to 300 mmHg.       A midline incision is made with a ten blade through the subcutaneous tissue to the level of the extensor mechanism. A fresh blade is used to make a medial parapatellar arthrotomy. Soft tissue over the proximal medial tibia is subperiosteally elevated to the joint line with a knife and into the semimembranosus bursa with a Cobb elevator. Soft tissue over the proximal  lateral tibia is elevated with attention being paid to avoiding the patellar tendon on the tibial tubercle. The patella is everted, knee flexed 90 degrees and the ACL and PCL are removed. Findings are bone on bone all 3 compartments with large global osteophytes.        The drill is used to create a starting hole in the distal femur and the canal is thoroughly irrigated with sterile saline to remove the fatty contents. The 5 degree Right  valgus alignment guide is placed into the femoral canal and the distal femoral cutting block is pinned to remove 9 mm off the distal femur. Resection is made with an oscillating saw.      The tibia is subluxed forward and the menisci are removed. The extramedullary alignment guide is placed referencing proximally at the medial aspect of the tibial tubercle and distally along the second metatarsal axis and tibial crest. The block is pinned to remove 80mm off the more deficient medial  side. Resection is made with an oscillating saw. Size 7is the most appropriate size for the tibia and the proximal tibia is prepared with the modular drill and keel punch for that size.      The femoral sizing guide is placed and size 6 is most appropriate. Rotation is marked off the epicondylar axis and confirmed by creating a rectangular flexion gap at 90 degrees. The size 6 cutting block is pinned in this rotation and the anterior, posterior and chamfer cuts are made with the oscillating saw. The intercondylar block is then placed and that cut is made.      Trial size 7 tibial  component, trial size 6 posterior stabilized femur and a 8  mm posterior stabilized rotating platform insert trial is placed. Full extension is achieved with excellent varus/valgus and anterior/posterior balance throughout full range of motion. The patella is everted and thickness measured to be 24  mm. Free hand resection is taken to 14 mm, a 38 template is placed, lug holes are drilled, trial patella is placed, and it  tracks normally. Osteophytes are removed off the posterior femur with the trial in place. All trials are removed and the cut bone surfaces prepared with pulsatile lavage. Cement is mixed and once ready for implantation, the size 7 tibial implant, size  6 posterior stabilized femoral component, and the size 38 patella are cemented in place and the patella is held with the clamp. The trial insert is placed and the knee held in full extension. The Exparel (20 ml mixed with 60 ml saline) is injected into the extensor mechanism, posterior capsule, medial and lateral gutters and subcutaneous tissues.  All extruded cement is removed and once the cement is hard the permanent 8 mm posterior stabilized rotating platform insert is placed into the tibial tray.      The wound is copiously irrigated with saline solution and the extensor mechanism closed over a hemovac drain with #1 V-loc suture. The tourniquet is released for a total tourniquet time of 34  minutes. Flexion against gravity is 140 degrees and the patella tracks normally. Subcutaneous tissue is closed with 2.0 vicryl and subcuticular with running 4.0 Monocryl. The incision is cleaned and dried and steri-strips and a bulky sterile dressing are applied. The limb is placed into a knee immobilizer and the patient is awakened and transported to recovery in stable condition.      Please note that a surgical assistant was a medical necessity for this procedure in order to perform it in a safe and expeditious manner. Surgical assistant was necessary to retract the ligaments and vital neurovascular structures to prevent injury to them and also necessary for proper positioning of the limb to allow for anatomic placement of the prosthesis.   [name] Crystin Lechtenberg, MD    06/17/2017, 10:19 AM

## 2017-06-17 NOTE — Anesthesia Procedure Notes (Signed)
Spinal  Patient location during procedure: OR Start time: 06/17/2017 9:08 AM End time: 06/17/2017 9:13 AM Reason for block: at surgeon's request Staffing Resident/CRNA: Anne Fu Performed: resident/CRNA  Preanesthetic Checklist Completed: patient identified, site marked, surgical consent, pre-op evaluation, timeout performed, IV checked, risks and benefits discussed and monitors and equipment checked Spinal Block Patient position: sitting Prep: DuraPrep Patient monitoring: heart rate, continuous pulse ox and blood pressure Approach: right paramedian Location: L2-3 Injection technique: single-shot Needle Needle type: Pencan  Needle gauge: 24 G Needle length: 9 cm Assessment Sensory level: T6 Additional Notes Expiration date of kit checked and confirmed. Patient tolerated procedure well, without complications. X 1 attempt with noted clear CSF return. Loss of motor and sensory on exam post injection.

## 2017-06-17 NOTE — Anesthesia Postprocedure Evaluation (Signed)
Anesthesia Post Note  Patient: [name]  Procedure(s) Performed: RIGHT TOTAL KNEE ARTHROPLASTY (Right Knee)     Patient location during evaluation: PACU Anesthesia Type: Spinal Level of consciousness: oriented and awake and alert Pain management: pain level controlled Vital Signs Assessment: post-procedure vital signs reviewed and stable Respiratory status: spontaneous breathing and respiratory function stable Cardiovascular status: blood pressure returned to baseline and stable Postop Assessment: no headache, no backache and no apparent nausea or vomiting Anesthetic complications: no    Last Vitals:  Vitals:   06/17/17 1215 06/17/17 1230  BP: 128/66 124/68  Pulse: 64 66  Resp: 12 11  Temp:    SpO2: 100% 100%    Last Pain:  Vitals:   06/17/17 1215  TempSrc:   PainSc: 0-No pain                 Lynda Rainwater

## 2017-06-17 NOTE — Anesthesia Procedure Notes (Signed)
Anesthesia Regional Block: Adductor canal block   Pre-Anesthetic Checklist: ,, timeout performed, Correct Patient, Correct Site, Correct Laterality, Correct Procedure, Correct Position, site marked, Risks and benefits discussed,  Surgical consent,  Pre-op evaluation,  At surgeon's request and post-op pain management  Laterality: Right  Prep: chloraprep       Needles:  Injection technique: Single-shot  Needle Type: Stimiplex     Needle Length: 9cm  Needle Gauge: 21     Additional Needles:   Procedures:,,,, ultrasound used (permanent image in chart),,,,  Narrative:  Start time: 06/17/2017 8:43 AM End time: 06/17/2017 8:48 AM Injection made incrementally with aspirations every 5 mL.  Performed by: Personally  Anesthesiologist: Candida Peeling RAY

## 2017-06-17 NOTE — H&P (View-Only) (Signed)
[name] DOB: [birth year] Married / Language: English / Race: White Female Date of Admission:  06/17/17 CC:  Right knee pain History of Present Illness The patient is a 66 year old female who comes in  for a preoperative History and Physical. The patient is scheduled for a right total knee arthroplasty to be performed by Dr. [name]. Aluisio, MD at [facility] on 06-17-2017. The patient is a 66 year old female who presented for follow up of their knee. The patient is being followed for their right knee pain and osteoarthritis. They are now months out from cortisone injection. Symptoms reported include: pain, aching, popping, grinding, giving way and pain with weightbearing. The patient feels that they are doing poorly and report their pain level to be moderate. The following medication has been used for pain control: Hydrocodone (for back issues) and Tylenol. She does have osteoarthritis in the right knee. She has had cortisone injections in the past. They only helped her for a week or two. She is now ready to proceed with knee replacement at this time. They have been treated conservatively in the past for the above stated problem and despite conservative measures, they continue to have progressive pain and severe functional limitations and dysfunction. They have failed non-operative management including home exercise, medications, and injections. It is felt that they would benefit from undergoing total joint replacement. Risks and benefits of the procedure have been discussed with the patient and they elect to proceed with surgery. There are no active contraindications to surgery such as ongoing infection or rapidly progressive neurological disease.    Problem List/Past Medical  Trochanteric bursitis of left hip (M70.62)  Impingement syndrome of right shoulder (M75.41)  Primary osteoarthritis of first carpometacarpal joint of left hand (M18.12)  Degenerative lumbar disc (M51.36)   Primary osteoarthritis of right knee (M17.11)  Migraine Headache  Depression  Skin Cancer  Degenerative Disc Disease  Bursitis  Staphlococcus Infections  Menopause   Allergies  NSAIDs  intolerance  Family History Mother  Deceased. age 77, HTN, Heart Attack Father  Deceased. age 53, Liver and Stomach Cancer Siblings  Sister deceased with Breast Cancer, Sister living with HTN, Psoriasis, High Cholesterol, Brother deceased with DM, HTN, High Cholesterol, Brother deceased accidental eletrocution, Brother living with HTN, High Cholesterol, hip and knee replacements  Social History  Tobacco use  Never smoker. Alcohol use  Currently drinks alcohol. rarely; cause migraines Exercise  Light, Moderate, 2 x week. Marital status  Married. Living situation  Lives with spouse. Current work status  Retired. Post-Surgical Plans  Home With Family, Home, Straight to Outpatient Therapy. Advance Directives  Living Will, Healthcare Power of [place].  Medication History Lomotil (2.5-0.025MG  Tablet, Oral as needed) Active. Atorvastatin Calcium (20MG  Tablet, Oral) Active. Wellbutrin XL (300MG  Tablet ER 24HR, Oral) Active. Furosemide (40MG  Tablet, Oral) Active. Claritin (10MG  Tablet, Oral) Active. Citracal + D (Oral) Specific strength unknown - Active. Vitamin D (2000UNIT Tablet, Oral) Active. Phazyme (Oral) Specific strength unknown - Active. Salonpas Arthritis Pain Relief (External) Active. Hydrocodone-Acetaminophen (10-650MG  Tablet, Oral) Active. (on a rare occasion) Lomotil Active. Phenergan (25MG  Tablet, 1/4 to 1/2 tab Oral as needed) Active. Diclofenac Sodium (1% Gel, 1 (one) Gram Transdermal apply 3-4 times daily prn pain, Taken starting 05/10/2016) Active. Tylenol (Oral) Specific strength unknown - Active. Nasonex Active.  Past Surgical History Left Hip Bursectomy  Skin Surgery for Cancer  Ruptured Disc Surgery  Neck - Ruptured Disc Surgery   Colectomy  Right Total Knee  Bilateral Foot Bunion Surgery  Breast Augmentatoin  Breast Reduction and Removal of Implants  Abdominoplasty  D&C  Lasik Eye Surgery  Hysteroscopy with Polypectomy  Right Thumb Joint Fusion  Tonsils  Left Foot Foreign Object Removal  Wisdom Teeth Extraction     Review of Systems General Not Present- Chills, Fatigue, Fever, Memory Loss, Night Sweats, Weight Gain and Weight Loss. Skin Not Present- Eczema, Hives, Itching, Lesions and Rash. HEENT Present- Blurred Vision and Headache. Not Present- Dentures, Double Vision, Hearing Loss, Tinnitus and Visual Loss. Respiratory Present- Allergies and Shortness of breath with exertion. Not Present- Chronic Cough, Coughing up blood and Shortness of breath at rest. Cardiovascular Not Present- Chest Pain, Difficulty Breathing Lying Down, Murmur, Palpitations, Racing/skipping heartbeats and Swelling. Gastrointestinal Present- Belching, Bloating, Constipation, Diarrhea and Loss of bowel control. Not Present- Abdominal Pain, Bloody Stool, Difficulty Swallowing, Heartburn, Jaundice, Loss of appetitie, Nausea and Vomiting. Female Genitourinary Present- Urinating at Night. Not Present- Blood in Urine, Discharge, Flank Pain, Incontinence, Painful Urination, Urgency, Urinary frequency, Urinary Retention and Weak urinary stream. Musculoskeletal Present- Back Pain, Joint Pain, Joint Stiffness and Morning Stiffness. Not Present- Joint Swelling, Muscle Pain, Muscle Weakness and Spasms. Neurological Present- Decreased Memory, Dizziness and Headaches. Not Present- Blackout spells, Difficulty with balance, Paralysis, Tremor and Weakness. Psychiatric Present- Insomnia. Endocrine Present- Heat Intolerance.  Vitals Weight: 180 lb Height: 67in Weight was reported by patient. Height was reported by patient. Body Surface Area: 1.93 m Body Mass Index: 28.19 kg/m  Pulse: 56 (Regular)  BP: 132/74 (Sitting, Right  Arm, Standard)   Physical Exam  General Mental Status -Alert, cooperative and good historian. General Appearance-pleasant, Not in acute distress. Orientation-Oriented X3. Build & Nutrition-Well nourished and Well developed.  Head and Neck Head-normocephalic, atraumatic . Neck Global Assessment - supple, no bruit auscultated on the right, no bruit auscultated on the left.  Eye Pupil - Bilateral-Regular and Round. Motion - Bilateral-EOMI.  Chest and Lung Exam Auscultation Breath sounds - clear at anterior chest wall and clear at posterior chest wall. Adventitious sounds - No Adventitious sounds.  Cardiovascular Auscultation Rhythm - Regular rate and rhythm(with occasional skipped or ectopic beat). Heart Sounds - S1 WNL and S2 WNL. Murmurs & Other Heart Sounds - Auscultation of the heart reveals - No Murmurs.  Abdomen Palpation/Percussion Tenderness - Abdomen is non-tender to palpation. Rigidity (guarding) - Abdomen is soft. Auscultation Auscultation of the abdomen reveals - Bowel sounds normal.  Female Genitourinary Note: Not done, not pertinent to present illness   Musculoskeletal Note: She is in no acute distress. Range of motion is 5 to 120. She does have significant patellofemoral crepitus. She is tender along the medial and lateral joint lines. No effusion or instability. Distal pulses 2+.   Assessment & Plan  Primary osteoarthritis of one knee, right (M17.11)  Note:Surgical Plans: Right Total Knee Replacement  Disposition: Home, straight to outpatient on Friday 06/21/17  PCP: Dr. Sandria Senter  IV TXA  Anesthesia Issues: None  Patient was instructed on what medications to stop prior to surgery.  Signed electronically by Joelene Millin, III PA-C

## 2017-06-17 NOTE — Transfer of Care (Signed)
Immediate Anesthesia Transfer of Care Note  Patient: [name]  Procedure(s) Performed: RIGHT TOTAL KNEE ARTHROPLASTY (Right Knee)  Patient Location: PACU  Anesthesia Type:Regional and Spinal  Level of Consciousness: awake, alert  and oriented  Airway & Oxygen Therapy: Patient Spontanous Breathing and Patient connected to face mask oxygen  Post-op Assessment: Report given to RN and Post -op Vital signs reviewed and stable  Post vital signs: Reviewed and stable  Last Vitals:  Vitals:   06/17/17 0855 06/17/17 0856  BP:    Pulse: 73 65  Resp: 15 15  Temp:    SpO2: 98% 98%    Last Pain:  Vitals:   06/17/17 0725  TempSrc: Oral  PainSc: 2          Complications: No apparent anesthesia complications

## 2017-06-17 NOTE — Evaluation (Signed)
Physical Therapy Evaluation Patient Details Name: [name] MRN: [id number] DOB: [birth year] Today's Date: 06/17/2017   History of Present Illness  RTKA  Clinical Impression  The patient stood at the bedside. Requested CPM to increase knee extension. Pt admitted with above diagnosis. Pt currently with functional limitations due to the deficits listed below (see PT Problem List).  Pt will benefit from skilled PT to increase their independence and safety with mobility to allow discharge to the venue listed below.  '     Follow Up Recommendations DC plan and follow up therapy as arranged by surgeon;Outpatient PT    Equipment Recommendations  None recommended by PT    Recommendations for Other Services       Precautions / Restrictions Precautions Precautions: Fall;Knee Required Braces or Orthoses: Knee Immobilizer - Right Knee Immobilizer - Right: Discontinue once straight leg raise with < 10 degree lag      Mobility  Bed Mobility Overal bed mobility: Needs Assistance Bed Mobility: Supine to Sit;Sit to Supine     Supine to sit: Min assist Sit to supine: Min assist   General bed mobility comments: support the right knee  Transfers Overall transfer level: Needs assistance Equipment used: Rolling walker (2 wheeled) Transfers: Sit to/from Stand Sit to Stand: Min assist         General transfer comment: cues for hand and right  leg( did not have KI on due to only standing)  Ambulation/Gait Ambulation/Gait assistance: Min assist   Assistive device: Rolling walker (2 wheeled)       General Gait Details: 4 sidesteps along the bed  Stairs            Wheelchair Mobility    Modified Rankin (Stroke Patients Only)       Balance                                             Pertinent Vitals/Pain Pain Assessment: 0-10 Pain Score: 7  Pain Location: right knee Pain Descriptors / Indicators:  Aching;Tightness;Discomfort;Grimacing;Guarding Pain Intervention(s): Patient requesting pain meds-RN notified;Repositioned;Premedicated before session;Monitored during session;Ice applied    Home Living Family/patient expects to be discharged to:: Private residence Living Arrangements: Spouse/significant other Available Help at Discharge: Family Type of Home: House Home Access: Stairs to enter   Technical brewer of Steps: 2+1 Home Layout: One level Home Equipment: Environmental consultant - 2 wheels      Prior Function Level of Independence: Independent with assistive device(s)         Comments: used RW past week     Hand Dominance        Extremity/Trunk Assessment   Upper Extremity Assessment Upper Extremity Assessment: Defer to OT evaluation    Lower Extremity Assessment Lower Extremity Assessment: RLE deficits/detail RLE Deficits / Details: Assist  for SLR    Cervical / Trunk Assessment Cervical / Trunk Assessment: Normal  Communication      Cognition Arousal/Alertness: Awake/alert Behavior During Therapy: WFL for tasks assessed/performed Overall Cognitive Status: Within Functional Limits for tasks assessed                                        General Comments      Exercises     Assessment/Plan    PT Assessment Patient needs continued  PT services  PT Problem List Decreased strength;Decreased range of motion;Decreased knowledge of use of DME;Decreased safety awareness;Decreased activity tolerance;Decreased knowledge of precautions;Decreased mobility;Pain       PT Treatment Interventions DME instruction;Gait training;Stair training;Functional mobility training;Therapeutic activities;Therapeutic exercise;Patient/family education    PT Goals (Current goals can be found in the Care Plan section)  Acute Rehab PT Goals Patient Stated Goal: to straighten my knee PT Goal Formulation: With patient/family Time For Goal Achievement: 06/22/17 Potential  to Achieve Goals: Good    Frequency 7X/week   Barriers to discharge        Co-evaluation               AM-PAC PT "6 Clicks" Daily Activity  Outcome Measure Difficulty turning over in bed (including adjusting bedclothes, sheets and blankets)?: Unable Difficulty moving from lying on back to sitting on the side of the bed? : Unable Difficulty sitting down on and standing up from a chair with arms (e.g., wheelchair, bedside commode, etc,.)?: Unable Help needed moving to and from a bed to chair (including a wheelchair)?: Total Help needed walking in hospital room?: Total Help needed climbing 3-5 steps with a railing? : Total 6 Click Score: 6    End of Session   Activity Tolerance: Patient tolerated treatment well Patient left: in bed;in CPM   PT Visit Diagnosis: Difficulty in walking, not elsewhere classified (R26.2);Pain Pain - Right/Left: Right Pain - part of body: Knee    Time: 1700-1723 PT Time Calculation (min) (ACUTE ONLY): 23 min   Charges:   PT Evaluation $PT Eval Low Complexity: 1 Low PT Treatments $Therapeutic Activity: 8-22 mins   PT G CodesTresa Endo PT [phone]   Claretha Cooper 06/17/2017, 5:45 PM

## 2017-06-17 NOTE — Progress Notes (Signed)
Orthopedic Tech Progress Note Patient Details:  [name] 06/17/1951 [id number]  CPM Right Knee CPM Right Knee: On Right Knee Flexion (Degrees): 40 Right Knee Extension (Degrees): 10   [place] Pink 06/17/2017, 2:25 PM

## 2017-06-17 NOTE — Anesthesia Preprocedure Evaluation (Signed)
Anesthesia Evaluation  Patient identified by MRN, date of birth, ID band Patient awake    Reviewed: Allergy & Precautions, NPO status , Patient's Chart, lab work & pertinent test results  Airway Mallampati: II  TM Distance: >3 FB Neck ROM: Full    Dental no notable dental hx.    Pulmonary neg pulmonary ROS,    Pulmonary exam normal breath sounds clear to auscultation       Cardiovascular Exercise Tolerance: Good hypertension, Pt. on medications Normal cardiovascular exam Rhythm:Regular Rate:Normal     Neuro/Psych  Headaches, PSYCHIATRIC DISORDERS Depression    GI/Hepatic Neg liver ROS, GERD  ,  Endo/Other  negative endocrine ROS  Renal/GU negative Renal ROS  negative genitourinary   Musculoskeletal  (+) Arthritis ,   Abdominal   Peds negative pediatric ROS (+)  Hematology negative hematology ROS (+)   Anesthesia Other Findings   Reproductive/Obstetrics negative OB ROS                             Anesthesia Physical  Anesthesia Plan  ASA: II  Anesthesia Plan: Spinal   Post-op Pain Management:  Regional for Post-op pain   Induction: Intravenous  PONV Risk Score and Plan: 2 and Ondansetron and Midazolam  Airway Management Planned: Simple Face Mask  Additional Equipment:   Intra-op Plan:   Post-operative Plan:   Informed Consent: I have reviewed the patients History and Physical, chart, labs and discussed the procedure including the risks, benefits and alternatives for the proposed anesthesia with the patient or authorized representative who has indicated his/her understanding and acceptance.   Dental advisory given  Plan Discussed with: CRNA  Anesthesia Plan Comments:                                          Anesthesia Evaluation  Patient identified by MRN, date of birth, ID band Patient awake    Reviewed: Allergy & Precautions, NPO status , Patient's Chart,  lab work & pertinent test results  Airway Mallampati: II  TM Distance: >3 FB Neck ROM: Full    Dental no notable dental hx.    Pulmonary neg pulmonary ROS,    Pulmonary exam normal breath sounds clear to auscultation       Cardiovascular Exercise Tolerance: Good hypertension, Pt. on medications Normal cardiovascular exam Rhythm:Regular Rate:Normal     Neuro/Psych  Headaches, PSYCHIATRIC DISORDERS Depression    GI/Hepatic Neg liver ROS, GERD  ,  Endo/Other  negative endocrine ROS  Renal/GU negative Renal ROS  negative genitourinary   Musculoskeletal  (+) Arthritis ,   Abdominal   Peds negative pediatric ROS (+)  Hematology negative hematology ROS (+)   Anesthesia Other Findings   Reproductive/Obstetrics negative OB ROS                             Anesthesia Physical Anesthesia Plan  ASA: II  Anesthesia Plan: General   Post-op Pain Management:    Induction: Intravenous  Airway Management Planned: Oral ETT  Additional Equipment:   Intra-op Plan:   Post-operative Plan: Extubation in OR  Informed Consent: I have reviewed the patients History and Physical, chart, labs and discussed the procedure including the risks, benefits and alternatives for the proposed anesthesia with the patient or authorized representative who has indicated his/her  understanding and acceptance.   Dental advisory given  Plan Discussed with: CRNA  Anesthesia Plan Comments:         Anesthesia Quick Evaluation  Anesthesia Quick Evaluation

## 2017-06-18 ENCOUNTER — Encounter (HOSPITAL_COMMUNITY): Payer: Self-pay | Admitting: Orthopedic Surgery

## 2017-06-18 LAB — CBC
HCT: 34.6 % — ABNORMAL LOW (ref 36.0–46.0)
HEMOGLOBIN: 11.4 g/dL — AB (ref 12.0–15.0)
MCH: 30.9 pg (ref 26.0–34.0)
MCHC: 32.9 g/dL (ref 30.0–36.0)
MCV: 93.8 fL (ref 78.0–100.0)
PLATELETS: 209 10*3/uL (ref 150–400)
RBC: 3.69 MIL/uL — AB (ref 3.87–5.11)
RDW: 13.7 % (ref 11.5–15.5)
WBC: 12.5 10*3/uL — AB (ref 4.0–10.5)

## 2017-06-18 LAB — BASIC METABOLIC PANEL
ANION GAP: 8 (ref 5–15)
BUN: 11 mg/dL (ref 6–20)
CO2: 27 mmol/L (ref 22–32)
Calcium: 8.6 mg/dL — ABNORMAL LOW (ref 8.9–10.3)
Chloride: 104 mmol/L (ref 101–111)
Creatinine, Ser: 0.7 mg/dL (ref 0.44–1.00)
Glucose, Bld: 121 mg/dL — ABNORMAL HIGH (ref 65–99)
POTASSIUM: 4.4 mmol/L (ref 3.5–5.1)
SODIUM: 139 mmol/L (ref 135–145)

## 2017-06-18 MED ORDER — HYDROMORPHONE HCL 2 MG PO TABS
2.0000 mg | ORAL_TABLET | ORAL | Status: DC | PRN
  Administered 2017-06-18: 4 mg via ORAL
  Administered 2017-06-18 (×3): 2 mg via ORAL
  Administered 2017-06-19 (×2): 4 mg via ORAL
  Administered 2017-06-19: 2 mg via ORAL
  Filled 2017-06-18: qty 2
  Filled 2017-06-18 (×2): qty 1
  Filled 2017-06-18: qty 2
  Filled 2017-06-18 (×4): qty 1

## 2017-06-18 MED ORDER — RIVAROXABAN 10 MG PO TABS: 10.0000 mg | ORAL_TABLET | Freq: Every day | ORAL | 0 refills | Status: DC

## 2017-06-18 MED ORDER — TRAMADOL HCL 50 MG PO TABS: 50.0000 mg | ORAL_TABLET | Freq: Four times a day (QID) | ORAL | 0 refills | Status: DC | PRN

## 2017-06-18 MED ORDER — HYDROMORPHONE HCL 2 MG PO TABS: 2.0000 mg | ORAL_TABLET | ORAL | 0 refills | Status: DC | PRN

## 2017-06-18 MED ORDER — METHOCARBAMOL 500 MG PO TABS: 500.0000 mg | ORAL_TABLET | Freq: Four times a day (QID) | ORAL | 0 refills | Status: DC | PRN

## 2017-06-18 NOTE — Progress Notes (Signed)
Physical Therapy Treatment Patient Details Name: [name] MRN: [id number] DOB: Oct 11, 1950 Today's Date: 06/18/2017    History of Present Illness RTKA    PT Comments    The patient is progressing well. Pain better controlled today.    Follow Up Recommendations  DC plan and follow up therapy as arranged by surgeon;Outpatient PT     Equipment Recommendations  None recommended by PT    Recommendations for Other Services       Precautions / Restrictions Precautions Precautions: Knee Required Braces or Orthoses: Knee Immobilizer - Right Knee Immobilizer - Right: Discontinue once straight leg raise with < 10 degree lag Restrictions Weight Bearing Restrictions: No    Mobility  Bed Mobility   Bed Mobility: Supine to Sit;Sit to Supine     Supine to sit: Supervision Sit to supine: Supervision   General bed mobility comments: OOB in recliner   Transfers Overall transfer level: Needs assistance Equipment used: Rolling walker (2 wheeled) Transfers: Sit to/from Stand Sit to Stand: Supervision         General transfer comment: verbal cues for hand placement   Ambulation/Gait Ambulation/Gait assistance: Min guard Ambulation Distance (Feet): 100 Feet Assistive device: Rolling walker (2 wheeled) Gait Pattern/deviations: Step-to pattern;Step-through pattern     General Gait Details: cues for sequence   Stairs            Wheelchair Mobility    Modified Rankin (Stroke Patients Only)       Balance                                            Cognition Arousal/Alertness: Awake/alert Behavior During Therapy: WFL for tasks assessed/performed Overall Cognitive Status: Within Functional Limits for tasks assessed                                        Exercises Total Joint Exercises Ankle Circles/Pumps: AROM;Both;10 reps Quad Sets: AROM;Both;10 reps Towel Squeeze: AROM;Both;10 reps Heel Slides: AAROM;Right;10  reps Hip ABduction/ADduction: AAROM;Right;10 reps Straight Leg Raises: AAROM;Right;10 reps Long Arc Quad: AAROM;Right;10 reps Knee Flexion: AAROM;Right;10 reps Goniometric ROM: 10-50 right knee flexion    General Comments        Pertinent Vitals/Pain Pain Assessment: 0-10 Pain Score: 3  Pain Location: right knee Pain Descriptors / Indicators: Aching;Sore Pain Intervention(s): Monitored during session;Premedicated before session;Patient requesting pain meds-RN notified;Ice applied    Home Living Family/patient expects to be discharged to:: Private residence Living Arrangements: Spouse/significant other Available Help at Discharge: Family Type of Home: House Home Access: Stairs to enter   Home Layout: One level Home Equipment: Environmental consultant - 2 wheels      Prior Function Level of Independence: Independent with assistive device(s)      Comments: used RW past week   PT Goals (current goals can now be found in the care plan section) Acute Rehab PT Goals Patient Stated Goal: to straighten my knee Progress towards PT goals: Progressing toward goals    Frequency    7X/week      PT Plan Current plan remains appropriate    Co-evaluation              AM-PAC PT "6 Clicks" Daily Activity  Outcome Measure  Difficulty turning over in bed (including adjusting bedclothes, sheets and blankets)?:  A Little Difficulty moving from lying on back to sitting on the side of the bed? : A Little Difficulty sitting down on and standing up from a chair with arms (e.g., wheelchair, bedside commode, etc,.)?: A Little Help needed moving to and from a bed to chair (including a wheelchair)?: A Little Help needed walking in hospital room?: A Lot   6 Click Score: 14    End of Session Equipment Utilized During Treatment: Right knee immobilizer Activity Tolerance: Patient tolerated treatment well Patient left: in bed;with call bell/phone within reach;in CPM   PT Visit Diagnosis: Difficulty  in walking, not elsewhere classified (R26.2);Pain Pain - Right/Left: Right Pain - part of body: Knee     Time: 1116-1203 PT Time Calculation (min) (ACUTE ONLY): 47 min  Charges:  $Gait Training: 8-22 mins $Therapeutic Exercise: 8-22 mins $Self Care/Home Management: May 04, 2023                    G Codes:         Claretha Cooper 06/18/2017, 1:27 PM

## 2017-06-18 NOTE — Progress Notes (Addendum)
   Subjective: 1 Day Post-Op Procedure(s) (LRB): RIGHT TOTAL KNEE ARTHROPLASTY (Right) Patient reports pain as moderate.   Patient seen in rounds for Dr. Wynelle Link. Patient is well, but has had some minor complaints of pain in the right knee, requiring pain medications We will start therapy today.  Plan is to go Home after hospital stay.  Objective: Vital signs in last 24 hours: Temp:  [97.5 F (36.4 C)-98.3 F (36.8 C)] 97.7 F (36.5 C) (10/09 0619) Pulse Rate:  [59-79] 59 (10/09 0619) Resp:  [11-16] 16 (10/09 0619) BP: (110-147)/(55-84) 115/73 (10/09 0619) SpO2:  [98 %-100 %] 100 % (10/09 0619)  Intake/Output from previous day:  Intake/Output Summary (Last 24 hours) at 06/18/17 0917 Last data filed at 06/18/17 0600  Gross per 24 hour  Intake             4100 ml  Output             2935 ml  Net             1165 ml    Intake/Output this shift: No intake/output data recorded.  Labs:  Recent Labs  06/18/17 0630  HGB 11.4*    Recent Labs  06/18/17 0630  WBC 12.5*  RBC 3.69*  HCT 34.6*  PLT 209    Recent Labs  06/18/17 0630  NA 139  K 4.4  CL 104  CO2 27  BUN 11  CREATININE 0.70  GLUCOSE 121*  CALCIUM 8.6*   No results for input(s): LABPT, INR in the last 72 hours.  EXAM General - Patient is Alert, Appropriate and Oriented Extremity - Sensation intact distally Intact pulses distally Dorsiflexion/Plantar flexion intact Dressing - dressing C/D/I Motor Function - intact, moving foot and toes well on exam.  Hemovac pulled without difficulty.  Past Medical History:  Diagnosis Date  . Arthritis    knees, hands  . Bursitis of left hip   . Cancer ([place])    hx skin cancer  . Depression   . GERD (gastroesophageal reflux disease)    INFREQUENT  . Headache    HX MIGRAINES   . Heart murmur     MVP 30 yrs ago - no treatment needed  . Hyperlipidemia    on statin  . Hypertension    "flucuations" has never been on any med  . Insomnia   . Irritable  bowel syndrome    s/p subtotal colectomy '86>diarrhea prone  . NEOPLASM, MALIGNANT, BASAL CELL, CARCINOMA, HX OF   . Seasonal allergies     Assessment/Plan: 1 Day Post-Op Procedure(s) (LRB): RIGHT TOTAL KNEE ARTHROPLASTY (Right) Principal Problem:   OA (osteoarthritis) of knee  Estimated body mass index is 28.04 kg/m as calculated from the following:   Height as of this encounter: 5\' 7"  (1.702 m).   Weight as of this encounter: 81.2 kg (179 lb). Up with therapy Plan for discharge tomorrow  DVT Prophylaxis - Xarelto and TED hose Weight-Bearing as tolerated to right leg D/C O2 and Pulse OX and try on Room Air  Arlee Muslim, PA-C Orthopaedic Surgery 06/18/2017, 9:17 AM

## 2017-06-18 NOTE — Evaluation (Signed)
Occupational Therapy Evaluation Patient Details Name: [name] MRN: [id number] DOB: 1950-11-24 Today's Date: 06/18/2017    History of Present Illness RTKA   Clinical Impression   This 66 y/o F presents with the above. At baseline Pt is independent with ADLs and was most recently mod independent with functional mobility. Pt completed functional mobility and functional mobility transfers with MinGuard assist this session at RW level, requires [place] for LB ADLs secondary to RLE functional limitations. Pt will return home with spouse who is able to assist with ADLs PRN. Education provided and questions answered throughout. Feel Pt will safely progress home with available assist from spouse. No further acute OT needs identified at this time. Will sign off.     Follow Up Recommendations  DC plan and follow up therapy as arranged by surgeon;Supervision/Assistance - 24 hour    Equipment Recommendations  3 in 1 bedside commode           Precautions / Restrictions Precautions Precautions: Fall;Knee Required Braces or Orthoses: Knee Immobilizer - Right Knee Immobilizer - Right: Discontinue once straight leg raise with < 10 degree lag Restrictions Weight Bearing Restrictions: No      Mobility Bed Mobility               General bed mobility comments: OOB in recliner   Transfers Overall transfer level: Needs assistance Equipment used: Rolling walker (2 wheeled) Transfers: Sit to/from Stand Sit to Stand: Min guard         General transfer comment: verbal cues for hand placement     Balance                                           ADL either performed or assessed with clinical judgement   ADL Overall ADL's : Needs assistance/impaired Eating/Feeding: Set up;Sitting   Grooming: Min guard;Standing   Upper Body Bathing: Min guard;Sitting   Lower Body Bathing: Minimal assistance;Sit to/from stand   Upper Body Dressing : Set up;Sitting   Lower  Body Dressing: Sit to/from stand;Moderate assistance Lower Body Dressing Details (indicate cue type and reason): educated on compensatory techniques for completing task  Toilet Transfer: Min guard;Ambulation;BSC;RW Toilet Transfer Details (indicate cue type and reason): BSC over toilet  Toileting- Clothing Manipulation and Hygiene: Min guard;Sit to/from stand   Tub/ Shower Transfer: Walk-in shower;Min guard;Ambulation;Rolling walker;3 in 1 Tub/Shower Transfer Details (indicate cue type and reason): educated on use of 3:1 in shower for increased safety during task completion Functional mobility during ADLs: Min guard;Rolling walker                           Pertinent Vitals/Pain Pain Assessment: 0-10 Pain Score: 4  Pain Location: right knee Pain Descriptors / Indicators: Aching;Sore Pain Intervention(s): Limited activity within patient's tolerance;Monitored during session;Ice applied;RN gave pain meds during session          Extremity/Trunk Assessment Upper Extremity Assessment Upper Extremity Assessment: Overall WFL for tasks assessed   Lower Extremity Assessment Lower Extremity Assessment: Defer to PT evaluation   Cervical / Trunk Assessment Cervical / Trunk Assessment: Normal   Communication Communication Communication: No difficulties   Cognition Arousal/Alertness: Awake/alert Behavior During Therapy: WFL for tasks assessed/performed Overall Cognitive Status: Within Functional Limits for tasks assessed  Home Living Family/patient expects to be discharged to:: Private residence Living Arrangements: Spouse/significant other Available Help at Discharge: Family Type of Home: House Home Access: Stairs to enter Technical brewer of Steps: 2+1   Home Layout: One level     Bathroom Shower/Tub: Occupational psychologist: Standard     Home Equipment: Environmental consultant - 2 wheels           Prior Functioning/Environment Level of Independence: Independent with assistive device(s)        Comments: used RW past week        OT Problem List: Decreased strength;Impaired balance (sitting and/or standing);Pain;Decreased knowledge of precautions;Decreased knowledge of use of DME or AE;Decreased activity tolerance;Decreased range of motion            OT Goals(Current goals can be found in the care plan section) Acute Rehab OT Goals Patient Stated Goal: to straighten my knee OT Goal Formulation: All assessment and education complete, DC therapy                                 AM-PAC PT "6 Clicks" Daily Activity     Outcome Measure Help from another person eating meals?: None Help from another person taking care of personal grooming?: A Little Help from another person toileting, which includes using toliet, bedpan, or urinal?: A Little Help from another person bathing (including washing, rinsing, drying)?: A Little Help from another person to put on and taking off regular upper body clothing?: None Help from another person to put on and taking off regular lower body clothing?: A Lot 6 Click Score: 19   End of Session Equipment Utilized During Treatment: Gait belt;Rolling walker CPM Right Knee CPM Right Knee: Off Nurse Communication: Mobility status  Activity Tolerance: Patient tolerated treatment well Patient left: in chair;with call bell/phone within reach  OT Visit Diagnosis: Other abnormalities of gait and mobility (R26.89);Pain Pain - Right/Left: Right Pain - part of body: Knee                Time: 9604-5409 OT Time Calculation (min): 28 min Charges:  OT General Charges $OT Visit: 1 Visit OT Evaluation $OT Eval Low Complexity: 1 Low OT Treatments $Self Care/Home Management : 8-22 mins G-Codes:     Lou Cal, OT Pager [phone] 06/18/2017   Raymondo Band 06/18/2017, 10:35 AM

## 2017-06-18 NOTE — Discharge Summary (Signed)
Physician Discharge Summary   Patient ID: [name] MRN: [id number] DOB/AGE: Feb 07, 1951 66 y.o.  Admit date: 06/17/2017 Discharge date: 06/19/2017  Primary Diagnosis:  Osteoarthritis  Right knee(s)  Admission Diagnoses:  Past Medical History:  Diagnosis Date  . Arthritis    knees, hands  . Bursitis of left hip   . Cancer ([place])    hx skin cancer  . Depression   . GERD (gastroesophageal reflux disease)    INFREQUENT  . Headache    HX MIGRAINES   . Heart murmur     MVP 30 yrs ago - no treatment needed  . Hyperlipidemia    on statin  . Hypertension    "flucuations" has never been on any med  . Insomnia   . Irritable bowel syndrome    s/p subtotal colectomy '86>diarrhea prone  . NEOPLASM, MALIGNANT, BASAL CELL, CARCINOMA, HX OF   . Seasonal allergies    Discharge Diagnoses:   Principal Problem:   OA (osteoarthritis) of knee  Estimated body mass index is 28.04 kg/m as calculated from the following:   Height as of this encounter: '5\' 7"'$  (1.702 m).   Weight as of this encounter: 81.2 kg (179 lb).  Procedure:  Procedure(s) (LRB): RIGHT TOTAL KNEE ARTHROPLASTY (Right)   Consults: None  HPI: [name] is a 66 y.o. year old female with end stage OA of her right knee with progressively worsening pain and dysfunction. She has constant pain, with activity and at rest and significant functional deficits with difficulties even with ADLs. She has had extensive non-op management including analgesics, injections of cortisone and viscosupplements, and home exercise program, but remains in significant pain with significant dysfunction.Radiographs show bone on bone arthritis medial and patellofemoral. She presents now for right Total Knee Arthroplasty.   Laboratory Data: Admission on 06/17/2017  Component Date Value Ref Range Status  . WBC 06/18/2017 12.5* 4.0 - 10.5 K/uL Final  . RBC 06/18/2017 3.69* 3.87 - 5.11 MIL/uL Final  . Hemoglobin 06/18/2017 11.4* 12.0 - 15.0 g/dL  Final  . HCT 06/18/2017 34.6* 36.0 - 46.0 % Final  . MCV 06/18/2017 93.8  78.0 - 100.0 fL Final  . MCH 06/18/2017 30.9  26.0 - 34.0 pg Final  . MCHC 06/18/2017 32.9  30.0 - 36.0 g/dL Final  . RDW 06/18/2017 13.7  11.5 - 15.5 % Final  . Platelets 06/18/2017 209  150 - 400 K/uL Final  . Sodium 06/18/2017 139  135 - 145 mmol/L Final  . Potassium 06/18/2017 4.4  3.5 - 5.1 mmol/L Final  . Chloride 06/18/2017 104  101 - 111 mmol/L Final  . CO2 06/18/2017 27  22 - 32 mmol/L Final  . Glucose, Bld 06/18/2017 121* 65 - 99 mg/dL Final  . BUN 06/18/2017 11  6 - 20 mg/dL Final  . Creatinine, Ser 06/18/2017 0.70  0.44 - 1.00 mg/dL Final  . Calcium 06/18/2017 8.6* 8.9 - 10.3 mg/dL Final  . GFR calc non Af Amer 06/18/2017 >60  >60 mL/min Final  . GFR calc Af Amer 06/18/2017 >60  >60 mL/min Final   Comment: (NOTE) The eGFR has been calculated using the CKD EPI equation. This calculation has not been validated in all clinical situations. eGFR's persistently <60 mL/min signify possible Chronic Kidney Disease.   Georgiann Hahn gap 06/18/2017 8  5 - 15 Final  Hospital Outpatient Visit on 06/10/2017  Component Date Value Ref Range Status  . aPTT 06/10/2017 30  24 - 36 seconds Final  .  WBC 06/10/2017 7.0  4.0 - 10.5 K/uL Final  . RBC 06/10/2017 4.89  3.87 - 5.11 MIL/uL Final  . Hemoglobin 06/10/2017 15.0  12.0 - 15.0 g/dL Final  . HCT 06/10/2017 44.5  36.0 - 46.0 % Final  . MCV 06/10/2017 91.0  78.0 - 100.0 fL Final  . MCH 06/10/2017 30.7  26.0 - 34.0 pg Final  . MCHC 06/10/2017 33.7  30.0 - 36.0 g/dL Final  . RDW 06/10/2017 13.4  11.5 - 15.5 % Final  . Platelets 06/10/2017 232  150 - 400 K/uL Final  . Sodium 06/10/2017 142  135 - 145 mmol/L Final  . Potassium 06/10/2017 4.3  3.5 - 5.1 mmol/L Final  . Chloride 06/10/2017 104  101 - 111 mmol/L Final  . CO2 06/10/2017 28  22 - 32 mmol/L Final  . Glucose, Bld 06/10/2017 84  65 - 99 mg/dL Final  . BUN 06/10/2017 16  6 - 20 mg/dL Final  . Creatinine, Ser  06/10/2017 0.88  0.44 - 1.00 mg/dL Final  . Calcium 06/10/2017 10.0  8.9 - 10.3 mg/dL Final  . Total Protein 06/10/2017 7.8  6.5 - 8.1 g/dL Final  . Albumin 06/10/2017 4.9  3.5 - 5.0 g/dL Final  . AST 06/10/2017 24  15 - 41 U/L Final  . ALT 06/10/2017 23  14 - 54 U/L Final  . Alkaline Phosphatase 06/10/2017 77  38 - 126 U/L Final  . Total Bilirubin 06/10/2017 0.8  0.3 - 1.2 mg/dL Final  . GFR calc non Af Amer 06/10/2017 >60  >60 mL/min Final  . GFR calc Af Amer 06/10/2017 >60  >60 mL/min Final   Comment: (NOTE) The eGFR has been calculated using the CKD EPI equation. This calculation has not been validated in all clinical situations. eGFR's persistently <60 mL/min signify possible Chronic Kidney Disease.   . Anion gap 06/10/2017 10  5 - 15 Final  . Prothrombin Time 06/10/2017 12.4  11.4 - 15.2 seconds Final  . INR 06/10/2017 0.93   Final  . ABO/RH(D) 06/10/2017 A POS   Final  . Antibody Screen 06/10/2017 NEG   Final  . Sample Expiration 06/10/2017 06/20/2017   Final  . Extend sample reason 06/10/2017 NO TRANSFUSIONS OR PREGNANCY IN THE PAST 3 MONTHS   Final  . MRSA, PCR 06/10/2017 NEGATIVE  NEGATIVE Final  . Staphylococcus aureus 06/10/2017 NEGATIVE  NEGATIVE Final   Comment: (NOTE) The Xpert SA Assay (FDA approved for NASAL specimens in patients 109 years of age and older), is one component of a comprehensive surveillance program. It is not intended to diagnose infection nor to guide or monitor treatment.   . ABO/RH(D) 06/10/2017 A POS   Final     X-Rays:No results found.  EKG: Orders placed or performed during the hospital encounter of 06/10/17  . EKG 12 lead  . EKG 12 lead     Hospital Course: [name] is a 66 y.o. who was admitted to [facility]. They were brought to the operating room on 06/17/2017 and underwent Procedure(s): RIGHT TOTAL KNEE ARTHROPLASTY.  Patient tolerated the procedure well and was later transferred to the recovery room and then to the  orthopaedic floor for postoperative care.  They were given PO and IV analgesics for pain control following their surgery.  They were given 24 hours of postoperative antibiotics of  Anti-infectives    Start     Dose/Rate Route Frequency Ordered Stop   06/17/17 1600  ceFAZolin (ANCEF) IVPB 2g/100 mL  premix     2 g 200 mL/hr over 30 Minutes Intravenous Every 6 hours 06/17/17 1321 06/17/17 2132   06/17/17 0701  ceFAZolin (ANCEF) 2-4 GM/100ML-% IVPB    Comments:  Algis Liming   : cabinet override      06/17/17 0701 06/17/17 0927   06/17/17 0658  ceFAZolin (ANCEF) IVPB 2g/100 mL premix     2 g 200 mL/hr over 30 Minutes Intravenous On call to O.R. 06/17/17 8469 06/17/17 0957     and started on DVT prophylaxis in the form of Xarelto.   PT and OT were ordered for total joint protocol.  Discharge planning consulted to help with postop disposition and equipment needs.  Patient had a decent night on the evening of surgery.  They started to get up OOB with therapy on day one. Hemovac drain was pulled without difficulty.  Continued to work with therapy into day two.  Dressing was changed on day two and the incision was healing well.  Patient was seen in rounds and was ready to go home.   Diet - Cardiac diet Follow up - in 2 weeks Activity - WBAT Disposition - Home Condition Upon Discharge - Stable D/C Meds - See DC Summary DVT Prophylaxis - Xarelto  Discharge Instructions    Call MD / Call 911    Complete by:  As directed    If you experience chest pain or shortness of breath, CALL 911 and be transported to the hospital emergency room.  If you develope a fever above 101 F, pus (white drainage) or increased drainage or redness at the wound, or calf pain, call your surgeon's office.   Change dressing    Complete by:  As directed    Change dressing daily with sterile 4 x 4 inch gauze dressing and apply TED hose. Do not submerge the incision under water.   Constipation Prevention    Complete by:  As  directed    Drink plenty of fluids.  Prune juice may be helpful.  You may use a stool softener, such as Colace (over the counter) 100 mg twice a day.  Use MiraLax (over the counter) for constipation as needed.   Diet - low sodium heart healthy    Complete by:  As directed    Discharge instructions    Complete by:  As directed    Take Xarelto for two and a half more weeks, then discontinue Xarelto. Once the patient has completed the blood thinner regimen, then take a Baby 81 mg Aspirin daily for three more weeks.   Pick up stool softner and laxative for home use following surgery while on pain medications. Do not submerge incision under water. Please use good hand washing techniques while changing dressing each day. May shower starting three days after surgery. Please use a clean towel to pat the incision dry following showers. Continue to use ice for pain and swelling after surgery. Do not use any lotions or creams on the incision until instructed by your surgeon.  Wear both TED hose on both legs during the day every day for three weeks, but may remove the TED hose at night at home.  Postoperative Constipation Protocol  Constipation - defined medically as fewer than three stools per week and severe constipation as less than one stool per week.  One of the most common issues patients have following surgery is constipation.  Even if you have a regular bowel pattern at home, your normal regimen is likely to be  disrupted due to multiple reasons following surgery.  Combination of anesthesia, postoperative narcotics, change in appetite and fluid intake all can affect your bowels.  In order to avoid complications following surgery, here are some recommendations in order to help you during your recovery period.  Colace (docusate) - Pick up an over-the-counter form of Colace or another stool softener and take twice a day as long as you are requiring postoperative pain medications.  Take with a full  glass of water daily.  If you experience loose stools or diarrhea, hold the colace until you stool forms back up.  If your symptoms do not get better within 1 week or if they get worse, check with your doctor.  Dulcolax (bisacodyl) - Pick up over-the-counter and take as directed by the product packaging as needed to assist with the movement of your bowels.  Take with a full glass of water.  Use this product as needed if not relieved by Colace only.   MiraLax (polyethylene glycol) - Pick up over-the-counter to have on hand.  MiraLax is a solution that will increase the amount of water in your bowels to assist with bowel movements.  Take as directed and can mix with a glass of water, juice, soda, coffee, or tea.  Take if you go more than two days without a movement. Do not use MiraLax more than once per day. Call your doctor if you are still constipated or irregular after using this medication for 7 days in a row.  If you continue to have problems with postoperative constipation, please contact the office for further assistance and recommendations.  If you experience "the worst abdominal pain ever" or develop nausea or vomiting, please contact the office immediatly for further recommendations for treatment.   Do not put a pillow under the knee. Place it under the heel.    Complete by:  As directed    Do not sit on low chairs, stoools or toilet seats, as it may be difficult to get up from low surfaces    Complete by:  As directed    Driving restrictions    Complete by:  As directed    No driving until released by the physician.   Increase activity slowly as tolerated    Complete by:  As directed    Lifting restrictions    Complete by:  As directed    No lifting until released by the physician.   Patient may shower    Complete by:  As directed    You may shower without a dressing once there is no drainage.  Do not wash over the wound.  If drainage remains, do not shower until drainage stops.   TED  hose    Complete by:  As directed    Use stockings (TED hose) for 3 weeks on both leg(s).  You may remove them at night for sleeping.   Weight bearing as tolerated    Complete by:  As directed    Laterality:  right   Extremity:  Lower     Allergies as of 06/18/2017      Reactions   Nsaids Other (See Comments)   Hurts stomach      Medication List    STOP taking these medications   B-12 2000 MCG Tabs   calcium citrate-vitamin D 315-200 MG-UNIT tablet Commonly known as:  CITRACAL+D   cholecalciferol 1000 units tablet Commonly known as:  VITAMIN D   diclofenac sodium 1 % Gel Commonly known as:  VOLTAREN   HYDROcodone-acetaminophen 10-650 MG tablet Commonly known as:  LORCET   OVER THE COUNTER MEDICATION   SYSTANE OP     TAKE these medications   acetaminophen 500 MG tablet Commonly known as:  TYLENOL Take 1,000 mg by mouth every 8 (eight) hours as needed for mild pain or headache.   atorvastatin 20 MG tablet Commonly known as:  LIPITOR Take 1 tablet (20 mg total) by mouth every morning.   buPROPion 300 MG 24 hr tablet Commonly known as:  WELLBUTRIN XL Take 1 tablet (300 mg total) by mouth daily.   diphenoxylate-atropine 2.5-0.025 MG tablet Commonly known as:  LOMOTIL Take 1 tablet by mouth 4 (four) times daily as needed for diarrhea or loose stools.   fluorouracil 5 % cream Commonly known as:  EFUDEX Apply 1 application topically See admin instructions. In January applies daily for 3 weeks then off for 1-3 weeks then resume for 3 weeks. "For skin cancers"   furosemide 40 MG tablet Commonly known as:  LASIX Take 1 tablet (40 mg total) by mouth daily.   HYDROmorphone 2 MG tablet Commonly known as:  DILAUDID Take 1-2 tablets (2-4 mg total) by mouth every 4 (four) hours as needed for moderate pain or severe pain.   loratadine 10 MG tablet Commonly known as:  CLARITIN Take 10 mg by mouth daily.   methocarbamol 500 MG tablet Commonly known as:  ROBAXIN Take  1 tablet (500 mg total) by mouth every 6 (six) hours as needed for muscle spasms.   niacinamide 500 MG tablet Take 500 mg by mouth 2 (two) times daily with a meal.   PHAZYME PO Take 1 tablet by mouth every morning.   promethazine 25 MG tablet Commonly known as:  PHENERGAN Take 0.5 tablets (12.5 mg total) by mouth every 6 (six) hours as needed for nausea (migraines). What changed:  how much to take   rivaroxaban 10 MG Tabs tablet Commonly known as:  XARELTO Take 1 tablet (10 mg total) by mouth daily with breakfast. Take Xarelto for two and a half more weeks following discharge from the hospital, then discontinue Xarelto. Once the patient has completed the blood thinner regimen, then take a Baby 81 mg Aspirin daily for three more weeks.   traMADol 50 MG tablet Commonly known as:  ULTRAM Take 1-2 tablets (50-100 mg total) by mouth every 6 (six) hours as needed for moderate pain.            Durable Medical Equipment        Start     Ordered   06/18/17 1215  For home use only DME Bedside commode  Once    Question:  Patient needs a bedside commode to treat with the following condition  Answer:  Fear for personal safety   06/18/17 1215       Discharge Care Instructions        Start     Ordered   06/18/17 0000  Weight bearing as tolerated    Question Answer Comment  Laterality right   Extremity Lower      06/18/17 2313   06/18/17 0000  Change dressing    Comments:  Change dressing daily with sterile 4 x 4 inch gauze dressing and apply TED hose. Do not submerge the incision under water.   06/18/17 2313     Follow-up Information    Gaynelle Arabian, MD. Schedule an appointment as soon as possible for a visit on 07/02/2017.   Specialty:  Orthopedic Surgery Contact information: [address] [place] [address] [phone]           Signed: Arlee Muslim, PA-C Orthopaedic Surgery 06/18/2017, 11:14 PM

## 2017-06-18 NOTE — Progress Notes (Signed)
Physical Therapy Treatment Patient Details Name: [name] MRN: [id number] DOB: [birth year] Today's Date: 06/18/2017    History of Present Illness RTKA    PT Comments    The patient is progressing well. Ambulating without KI, is very  Determined to  Obtain knee extension to near 0. .   Follow Up Recommendations  DC plan and follow up therapy as arranged by surgeon;Outpatient PT     Equipment Recommendations  None recommended by PT    Recommendations for Other Services       Precautions / Restrictions Precautions Precautions: Knee Precaution Comments: ambulated without the KI Required Braces or Orthoses: Knee Immobilizer - Right Knee Immobilizer - Right: Discontinue once straight leg raise with < 10 degree lag    Mobility  Bed Mobility   Bed Mobility: Supine to Sit;Sit to Supine     Supine to sit: Supervision Sit to supine: Supervision      Transfers Overall transfer level: Needs assistance Equipment used: Rolling walker (2 wheeled) Transfers: Sit to/from Stand Sit to Stand: Supervision         General transfer comment: verbal cues for hand placement   Ambulation/Gait Ambulation/Gait assistance: Min guard Ambulation Distance (Feet): 100 Feet Assistive device: Rolling walker (2 wheeled) Gait Pattern/deviations: Step-to pattern;Step-through pattern     General Gait Details: cues for sequence   Stairs Stairs: Yes   Stair Management: One rail Right;Step to pattern;With crutches Number of Stairs: 2 General stair comments: cues for sequence   Wheelchair Mobility    Modified Rankin (Stroke Patients Only)       Balance                                            Cognition Arousal/Alertness: Awake/alert                                            Exercises Total Joint Exercises Ankle Circles/Pumps: AROM;Both;10 reps     General Comments        Pertinent Vitals/Pain Pain Score: 3  Pain Location:  right knee Pain Descriptors / Indicators: Aching;Sore Pain Intervention(s): Monitored during session;Repositioned;Patient requesting pain meds-RN notified;Ice applied    Home Living                      Prior Function            PT Goals (current goals can now be found in the care plan section) Progress towards PT goals: Progressing toward goals    Frequency    7X/week      PT Plan Current plan remains appropriate    Co-evaluation              AM-PAC PT "6 Clicks" Daily Activity  Outcome Measure  Difficulty turning over in bed (including adjusting bedclothes, sheets and blankets)?: None Difficulty moving from lying on back to sitting on the side of the bed? : None Difficulty sitting down on and standing up from a chair with arms (e.g., wheelchair, bedside commode, etc,.)?: A Little Help needed moving to and from a bed to chair (including a wheelchair)?: A Little Help needed walking in hospital room?: A Little Help needed climbing 3-5 steps with a railing? : A Lot 6 Click Score: 19  End of Session Equipment Utilized During Treatment: Right knee immobilizer Activity Tolerance: Patient tolerated treatment well Patient left: in bed;in CPM;with call bell/phone within reach Nurse Communication: Mobility status PT Visit Diagnosis: Difficulty in walking, not elsewhere classified (R26.2);Pain Pain - Right/Left: Right Pain - part of body: Knee     Time: 1525-1546 PT Time Calculation (min) (ACUTE ONLY): 21 min  Charges:  $Gait Training: 8-22 mins $Therapeutic Exercise: 8-22 mins $Self Care/Home Management: 8-22                    G Codes:       {Leylani Duley PT [phone]    Claretha Cooper 06/18/2017, 4:01 PM

## 2017-06-19 LAB — BASIC METABOLIC PANEL
ANION GAP: 8 (ref 5–15)
BUN: 16 mg/dL (ref 6–20)
CHLORIDE: 108 mmol/L (ref 101–111)
CO2: 27 mmol/L (ref 22–32)
Calcium: 9 mg/dL (ref 8.9–10.3)
Creatinine, Ser: 0.82 mg/dL (ref 0.44–1.00)
GFR calc non Af Amer: >60 mL/min (ref >60)
Glucose, Bld: 107 mg/dL — ABNORMAL HIGH (ref 65–99)
Potassium: 3.6 mmol/L (ref 3.5–5.1)
Sodium: 143 mmol/L (ref 135–145)

## 2017-06-19 LAB — CBC
HEMATOCRIT: 31.7 % — AB (ref 36.0–46.0)
HEMOGLOBIN: 10.5 g/dL — AB (ref 12.0–15.0)
MCH: 31.1 pg (ref 26.0–34.0)
MCHC: 33.1 g/dL (ref 30.0–36.0)
MCV: 93.8 fL (ref 78.0–100.0)
Platelets: 178 10*3/uL (ref 150–400)
RBC: 3.38 MIL/uL — ABNORMAL LOW (ref 3.87–5.11)
RDW: 13.7 % (ref 11.5–15.5)
WBC: 10.6 10*3/uL — AB (ref 4.0–10.5)

## 2017-06-19 NOTE — Progress Notes (Signed)
Physical Therapy Treatment Patient Details Name: [name] MRN: [id number] DOB: 1950-11-10 Today's Date: 06/19/2017    History of Present Illness RTKA    PT Comments    Ready for DC   Follow Up Recommendations        Equipment Recommendations  None recommended by PT    Recommendations for Other Services       Precautions / Restrictions Precautions Precautions: Knee Precaution Comments: ambulated without the KI Required Braces or Orthoses: Knee Immobilizer - Right    Mobility  Bed Mobility Overal bed mobility: Modified Independent                Transfers   Equipment used: Rolling walker (2 wheeled) Transfers: Sit to/from Stand Sit to Stand: Supervision            Ambulation/Gait Ambulation/Gait assistance: Supervision Ambulation Distance (Feet): 100 Feet Assistive device: Rolling walker (2 wheeled) Gait Pattern/deviations: Step-through pattern     General Gait Details: cues for sequence   Stairs Stairs: Yes   Stair Management: One rail Right;Step to pattern;With cane Number of Stairs: 2 General stair comments: spouse present to assist  Wheelchair Mobility    Modified Rankin (Stroke Patients Only)       Balance                                            Cognition Arousal/Alertness: Awake/alert                                            Exercises Total Joint Exercises Ankle Circles/Pumps: AROM;Both;10 reps Quad Sets: AROM;Both;10 reps Towel Squeeze: AROM;Both;10 reps Heel Slides: AAROM;Right;10 reps Hip ABduction/ADduction: AAROM;Right;10 reps Straight Leg Raises: AAROM;Right;10 reps Long Arc Quad: AAROM;Right;10 reps Knee Flexion: AAROM;Right;10 reps Goniometric ROM: 10-70 right knee    General Comments        Pertinent Vitals/Pain Pain Score: 3  Pain Location: right knee Pain Descriptors / Indicators: Aching;Sore Pain Intervention(s): Monitored during session;Premedicated  before session    Home Living                      Prior Function            PT Goals (current goals can now be found in the care plan section) Progress towards PT goals: Progressing toward goals    Frequency    7X/week      PT Plan Current plan remains appropriate    Co-evaluation              AM-PAC PT "6 Clicks" Daily Activity  Outcome Measure  Difficulty turning over in bed (including adjusting bedclothes, sheets and blankets)?: None Difficulty moving from lying on back to sitting on the side of the bed? : None Difficulty sitting down on and standing up from a chair with arms (e.g., wheelchair, bedside commode, etc,.)?: None Help needed moving to and from a bed to chair (including a wheelchair)?: None Help needed walking in hospital room?: A Little Help needed climbing 3-5 steps with a railing? : A Little 6 Click Score: 22    End of Session   Activity Tolerance: Patient tolerated treatment well Patient left: in bed;in CPM   PT Visit Diagnosis: Difficulty in walking, not elsewhere classified (  R26.2);Pain Pain - Right/Left: Right Pain - part of body: Knee     Time: 6825-7493 PT Time Calculation (min) (ACUTE ONLY): 50 min  Charges:  $Gait Training: 8-22 mins $Therapeutic Exercise: 8-22 mins $Self Care/Home Management: 8-22                    G CodesTresa Endo PT [phone]    Claretha Cooper 06/19/2017, 2:43 PM

## 2017-06-19 NOTE — Progress Notes (Signed)
   Subjective: 2 Days Post-Op Procedure(s) (LRB): RIGHT TOTAL KNEE ARTHROPLASTY (Right) Patient reports pain as mild.   Patient seen in rounds with Dr. Wynelle Link. Patient is well, but has had some minor complaints of pain in the knee, requiring pain medications Patient is ready to go home  Objective: Vital signs in last 24 hours: Temp:  [97.9 F (36.6 C)-98.1 F (36.7 C)] 97.9 F (36.6 C) (10/10 0603) Pulse Rate:  [66-81] 66 (10/10 0603) Resp:  [16-17] 16 (10/10 0603) BP: (104-141)/(52-74) 141/74 (10/10 0603) SpO2:  [96 %-99 %] 97 % (10/10 0603)  Intake/Output from previous day:  Intake/Output Summary (Last 24 hours) at 06/19/17 0810 Last data filed at 06/19/17 0603  Gross per 24 hour  Intake           1903.5 ml  Output             1250 ml  Net            653.5 ml    Intake/Output this shift: No intake/output data recorded.  Labs:  Recent Labs  06/18/17 0630 06/19/17 0548  HGB 11.4* 10.5*    Recent Labs  06/18/17 0630 06/19/17 0548  WBC 12.5* 10.6*  RBC 3.69* 3.38*  HCT 34.6* 31.7*  PLT 209 178    Recent Labs  06/18/17 0630 06/19/17 0548  NA 139 143  K 4.4 3.6  CL 104 108  CO2 27 27  BUN 11 16  CREATININE 0.70 0.82  GLUCOSE 121* 107*  CALCIUM 8.6* 9.0   No results for input(s): LABPT, INR in the last 72 hours.  EXAM: General - Patient is Alert and Appropriate Extremity - Neurovascular intact Sensation intact distally Incision - clean, dry, no drainage Motor Function - intact, moving foot and toes well on exam.   Assessment/Plan: 2 Days Post-Op Procedure(s) (LRB): RIGHT TOTAL KNEE ARTHROPLASTY (Right) Procedure(s) (LRB): RIGHT TOTAL KNEE ARTHROPLASTY (Right) Past Medical History:  Diagnosis Date  . Arthritis    knees, hands  . Bursitis of left hip   . Cancer ([place])    hx skin cancer  . Depression   . GERD (gastroesophageal reflux disease)    INFREQUENT  . Headache    HX MIGRAINES   . Heart murmur     MVP 30 yrs ago - no treatment  needed  . Hyperlipidemia    on statin  . Hypertension    "flucuations" has never been on any med  . Insomnia   . Irritable bowel syndrome    s/p subtotal colectomy '86>diarrhea prone  . NEOPLASM, MALIGNANT, BASAL CELL, CARCINOMA, HX OF   . Seasonal allergies    Principal Problem:   OA (osteoarthritis) of knee  Estimated body mass index is 28.04 kg/m as calculated from the following:   Height as of this encounter: 5\' 7"  (1.702 m).   Weight as of this encounter: 81.2 kg (179 lb).  Diet - Cardiac diet Follow up - in 2 weeks Activity - WBAT Disposition - Home Condition Upon Discharge - Stable D/C Meds - See DC Summary DVT Prophylaxis - Xarelto  Arlee Muslim, PA-C Orthopaedic Surgery 06/19/2017, 8:10 AM

## 2017-06-19 NOTE — Care Management Note (Signed)
Case Management Note  Patient Details  Name: AUGUST GOSSER MRN: [id number] Date of Birth: June 08, 1951  Subjective/Objective:  OA of knee                  Action/Plan: OP rehab at Office   Expected Discharge Date:  06/19/17               Expected Discharge Plan:  OP Rehab  In-House Referral:     Discharge planning Services  CM Consult  Post Acute Care Choice:    Choice offered to:     DME Arranged:  Bedside commode DME Agency:  [place]:    [facility] Agency:     Status of Service:  Completed, signed off  If discussed at [place] of Stay Meetings, dates discussed:    Additional Comments:  Purcell Mouton, RN 06/19/2017, 9:59 AM

## 2017-06-21 DIAGNOSIS — M25661 Stiffness of right knee, not elsewhere classified: Secondary | ICD-10-CM | POA: Diagnosis not present

## 2017-06-21 DIAGNOSIS — M25561 Pain in right knee: Secondary | ICD-10-CM | POA: Diagnosis not present

## 2017-06-21 DIAGNOSIS — Z96651 Presence of right artificial knee joint: Secondary | ICD-10-CM | POA: Diagnosis not present

## 2017-06-24 DIAGNOSIS — Z96651 Presence of right artificial knee joint: Secondary | ICD-10-CM | POA: Diagnosis not present

## 2017-06-24 DIAGNOSIS — M25561 Pain in right knee: Secondary | ICD-10-CM | POA: Diagnosis not present

## 2017-06-24 DIAGNOSIS — M25661 Stiffness of right knee, not elsewhere classified: Secondary | ICD-10-CM | POA: Diagnosis not present

## 2017-06-26 DIAGNOSIS — M25561 Pain in right knee: Secondary | ICD-10-CM | POA: Diagnosis not present

## 2017-06-26 DIAGNOSIS — Z96651 Presence of right artificial knee joint: Secondary | ICD-10-CM | POA: Diagnosis not present

## 2017-06-26 DIAGNOSIS — M25661 Stiffness of right knee, not elsewhere classified: Secondary | ICD-10-CM | POA: Diagnosis not present

## 2017-06-28 DIAGNOSIS — Z96651 Presence of right artificial knee joint: Secondary | ICD-10-CM | POA: Diagnosis not present

## 2017-06-28 DIAGNOSIS — M25561 Pain in right knee: Secondary | ICD-10-CM | POA: Diagnosis not present

## 2017-06-28 DIAGNOSIS — M25661 Stiffness of right knee, not elsewhere classified: Secondary | ICD-10-CM | POA: Diagnosis not present

## 2017-07-01 DIAGNOSIS — M25661 Stiffness of right knee, not elsewhere classified: Secondary | ICD-10-CM | POA: Diagnosis not present

## 2017-07-01 DIAGNOSIS — M25561 Pain in right knee: Secondary | ICD-10-CM | POA: Diagnosis not present

## 2017-07-01 DIAGNOSIS — Z96651 Presence of right artificial knee joint: Secondary | ICD-10-CM | POA: Diagnosis not present

## 2017-07-02 DIAGNOSIS — M1711 Unilateral primary osteoarthritis, right knee: Secondary | ICD-10-CM | POA: Diagnosis not present

## 2017-07-03 DIAGNOSIS — M25561 Pain in right knee: Secondary | ICD-10-CM | POA: Diagnosis not present

## 2017-07-03 DIAGNOSIS — Z96651 Presence of right artificial knee joint: Secondary | ICD-10-CM | POA: Diagnosis not present

## 2017-07-03 DIAGNOSIS — M25661 Stiffness of right knee, not elsewhere classified: Secondary | ICD-10-CM | POA: Diagnosis not present

## 2017-07-05 DIAGNOSIS — M25561 Pain in right knee: Secondary | ICD-10-CM | POA: Diagnosis not present

## 2017-07-05 DIAGNOSIS — Z96651 Presence of right artificial knee joint: Secondary | ICD-10-CM | POA: Diagnosis not present

## 2017-07-05 DIAGNOSIS — M25661 Stiffness of right knee, not elsewhere classified: Secondary | ICD-10-CM | POA: Diagnosis not present

## 2017-07-08 DIAGNOSIS — M25561 Pain in right knee: Secondary | ICD-10-CM | POA: Diagnosis not present

## 2017-07-08 DIAGNOSIS — M25661 Stiffness of right knee, not elsewhere classified: Secondary | ICD-10-CM | POA: Diagnosis not present

## 2017-07-08 DIAGNOSIS — Z96651 Presence of right artificial knee joint: Secondary | ICD-10-CM | POA: Diagnosis not present

## 2017-07-10 DIAGNOSIS — M25561 Pain in right knee: Secondary | ICD-10-CM | POA: Diagnosis not present

## 2017-07-10 DIAGNOSIS — Z96651 Presence of right artificial knee joint: Secondary | ICD-10-CM | POA: Diagnosis not present

## 2017-07-10 DIAGNOSIS — M25661 Stiffness of right knee, not elsewhere classified: Secondary | ICD-10-CM | POA: Diagnosis not present

## 2017-07-12 DIAGNOSIS — Z96651 Presence of right artificial knee joint: Secondary | ICD-10-CM | POA: Diagnosis not present

## 2017-07-12 DIAGNOSIS — M25561 Pain in right knee: Secondary | ICD-10-CM | POA: Diagnosis not present

## 2017-07-12 DIAGNOSIS — M25661 Stiffness of right knee, not elsewhere classified: Secondary | ICD-10-CM | POA: Diagnosis not present

## 2017-07-15 DIAGNOSIS — M25661 Stiffness of right knee, not elsewhere classified: Secondary | ICD-10-CM | POA: Diagnosis not present

## 2017-07-15 DIAGNOSIS — Z96651 Presence of right artificial knee joint: Secondary | ICD-10-CM | POA: Diagnosis not present

## 2017-07-15 DIAGNOSIS — M25561 Pain in right knee: Secondary | ICD-10-CM | POA: Diagnosis not present

## 2017-07-17 DIAGNOSIS — M25661 Stiffness of right knee, not elsewhere classified: Secondary | ICD-10-CM | POA: Diagnosis not present

## 2017-07-17 DIAGNOSIS — Z96651 Presence of right artificial knee joint: Secondary | ICD-10-CM | POA: Diagnosis not present

## 2017-07-17 DIAGNOSIS — M25561 Pain in right knee: Secondary | ICD-10-CM | POA: Diagnosis not present

## 2017-07-19 DIAGNOSIS — M25661 Stiffness of right knee, not elsewhere classified: Secondary | ICD-10-CM | POA: Diagnosis not present

## 2017-07-19 DIAGNOSIS — Z96651 Presence of right artificial knee joint: Secondary | ICD-10-CM | POA: Diagnosis not present

## 2017-07-19 DIAGNOSIS — M25561 Pain in right knee: Secondary | ICD-10-CM | POA: Diagnosis not present

## 2017-07-22 DIAGNOSIS — M25561 Pain in right knee: Secondary | ICD-10-CM | POA: Diagnosis not present

## 2017-07-22 DIAGNOSIS — M25661 Stiffness of right knee, not elsewhere classified: Secondary | ICD-10-CM | POA: Diagnosis not present

## 2017-07-22 DIAGNOSIS — Z96651 Presence of right artificial knee joint: Secondary | ICD-10-CM | POA: Diagnosis not present

## 2017-07-23 DIAGNOSIS — Z471 Aftercare following joint replacement surgery: Secondary | ICD-10-CM | POA: Diagnosis not present

## 2017-07-23 DIAGNOSIS — M1711 Unilateral primary osteoarthritis, right knee: Secondary | ICD-10-CM | POA: Diagnosis not present

## 2017-07-23 DIAGNOSIS — Z96651 Presence of right artificial knee joint: Secondary | ICD-10-CM | POA: Diagnosis not present

## 2017-07-25 DIAGNOSIS — Z96651 Presence of right artificial knee joint: Secondary | ICD-10-CM | POA: Diagnosis not present

## 2017-07-25 DIAGNOSIS — M25661 Stiffness of right knee, not elsewhere classified: Secondary | ICD-10-CM | POA: Diagnosis not present

## 2017-07-25 DIAGNOSIS — M25561 Pain in right knee: Secondary | ICD-10-CM | POA: Diagnosis not present

## 2017-08-20 ENCOUNTER — Other Ambulatory Visit: Payer: Self-pay | Admitting: Family Medicine

## 2017-08-20 DIAGNOSIS — Z1231 Encounter for screening mammogram for malignant neoplasm of breast: Secondary | ICD-10-CM

## 2017-09-01 ENCOUNTER — Emergency Department (HOSPITAL_COMMUNITY)
Admission: EM | Admit: 2017-09-01 | Discharge: 2017-09-01 | Disposition: A | Discharge status: Home / Self Care | Payer: Medicare Other | Attending: Emergency Medicine | Admitting: Emergency Medicine

## 2017-09-01 ENCOUNTER — Emergency Department (HOSPITAL_COMMUNITY): Payer: Medicare Other

## 2017-09-01 DIAGNOSIS — M542 Cervicalgia: Secondary | ICD-10-CM | POA: Diagnosis not present

## 2017-09-01 DIAGNOSIS — M5412 Radiculopathy, cervical region: Secondary | ICD-10-CM | POA: Insufficient documentation

## 2017-09-01 DIAGNOSIS — S199XXA Unspecified injury of neck, initial encounter: Secondary | ICD-10-CM | POA: Diagnosis not present

## 2017-09-01 MED ORDER — HYDROMORPHONE HCL 1 MG/ML IJ SOLN
1.0000 mg | Freq: Once | INTRAMUSCULAR | Status: AC
  Administered 2017-09-01: 1 mg via INTRAVENOUS
  Filled 2017-09-01: qty 1

## 2017-09-01 MED ORDER — DEXAMETHASONE SODIUM PHOSPHATE 10 MG/ML IJ SOLN
10.0000 mg | Freq: Once | INTRAMUSCULAR | Status: AC
  Administered 2017-09-01: 10 mg via INTRAVENOUS
  Filled 2017-09-01: qty 1

## 2017-09-01 MED ORDER — PREDNISONE 10 MG (21) PO TBPK: ORAL_TABLET | Freq: Every day | ORAL | 0 refills | Status: DC

## 2017-09-01 MED ORDER — HYDROCODONE-ACETAMINOPHEN 5-325 MG PO TABS: 1.0000 | ORAL_TABLET | Freq: Four times a day (QID) | ORAL | 0 refills | Status: DC | PRN

## 2017-09-01 MED ORDER — METHOCARBAMOL 1000 MG/10ML IJ SOLN
1000.0000 mg | Freq: Once | INTRAVENOUS | Status: AC
  Administered 2017-09-01: 1000 mg via INTRAVENOUS
  Filled 2017-09-01: qty 10

## 2017-09-01 NOTE — ED Triage Notes (Signed)
Pt complains of neck pain since yesterday, she's had neck surgery before and last night she was looking at her Ipad and her husband handed her something and she turned to look at it and there was a painful catch in her neck No relief with massage or stretch

## 2017-09-01 NOTE — ED Provider Notes (Signed)
[facility] DEPT Provider Note   CSN: [id number] Arrival date & time: 09/01/17  2542     History   Chief Complaint Chief Complaint  Patient presents with  . Neck Pain    HPI [name] is a 65 y.o. female.  HPI 66 year old female who presents with neck pain.  Reports history of cervical anterior fusion in 2000 and.  Reports occasional neck pain.  Yesterday reports that she suddenly turned when her husband tried to hand her a Charity fundraiser.  She jerked back after touching the package because it was a sharp component to it.  States that those movements caused her to have sudden onset of severe left-sided neck pain.  Radiates to the left shoulder.  Described pain as burning, throbbing, sharp.  Pain worse with movement of the neck.  No extremity numbness, weakness, bowel or urinary incontinence, or urinary retention.  No fall or trauma. At home took a muscle relaxant, Tylenol, then hydrocodone, then 2 mg of oral Dilaudid, without improvement in her symptoms.   Past Medical History:  Diagnosis Date  . Arthritis    knees, hands  . Bursitis of left hip   . Cancer ([place])    hx skin cancer  . Depression   . GERD (gastroesophageal reflux disease)    INFREQUENT  . Headache    HX MIGRAINES   . Heart murmur     MVP 30 yrs ago - no treatment needed  . Hyperlipidemia    on statin  . Hypertension    "flucuations" has never been on any med  . Insomnia   . Irritable bowel syndrome    s/p subtotal colectomy '86>diarrhea prone  . NEOPLASM, MALIGNANT, BASAL CELL, CARCINOMA, HX OF   . Seasonal allergies     Patient Active Problem List   Diagnosis Date Noted  . OA (osteoarthritis) of knee 06/17/2017  . Trochanteric bursitis of left hip 07/03/2015  . Routine general medical examination at a health care facility 11/18/2014  . CARCINOMA, SKIN, SQUAMOUS CELL 07/06/2010  . ACTINIC KERATOSIS 07/06/2010  . NEOPLASM, MALIGNANT, BASAL CELL, CARCINOMA, HX OF  07/06/2010  . Hyperlipidemia 06/08/2009  . DEPRESSION 06/08/2009  . POSTMENOPAUSAL STATUS 06/08/2009  . IRRITABLE BOWEL SYNDROME 02/17/2008  . BACK PAIN, CHRONIC 02/17/2008  . MIGRAINE HEADACHE 02/05/2007  . Essential hypertension 02/05/2007    Past Surgical History:  Procedure Laterality Date  . ABDOMINOPLASTY    . [place]   lower back  . BREAST ENHANCEMENT SURGERY  1983  . BREAST IMPLANT REMOVAL  08/2010  . BREAST SURGERY     Augmentation in 1983, Reduction 2011  . BUNIONECTOMY     right  and left foot  . CERVICAL SPINE SURGERY  12/18/1999   C5/6 and C6/7  . COLECTOMY  1986  . COSMETIC SURGERY    . EXCISION/RELEASE BURSA HIP Left 07/04/2015   Procedure: LEFT HIP BURSECTOMY WITH ;  Surgeon: Gaynelle Arabian, MD;  Location: [facility];  Service: Orthopedics;  Laterality: Left;  . EYE SURGERY     Lasik 2010  . eyelid surgery    . FRACTURE SURGERY    . HYSTEROSCOPY W/D&C  08/17/2011   Procedure: DILATATION AND CURETTAGE (D&C) /HYSTEROSCOPY;  Surgeon: Margarette Asal;  Location: [facility];  Service: Gynecology;  Laterality: N/A;  . INJECTION KNEE Right 07/04/2015   Procedure: CORTISONE INJECTION RIGHT KNEE ;  Surgeon: Gaynelle Arabian, MD;  Location: [facility];  Service: Orthopedics;  Laterality: Right;  .  KNEE ARTHROSCOPY     right x 2  . KNEE RECONSTRUCTION, MEDIAL PATELLAR FEMORAL LIGAMENT     right  . LASIK  2009    bilateral  . left foot surgery     removed foreign object  . OPEN SURGICAL REPAIR OF GLUTEAL TENDON Left 07/04/2015   Procedure: GLUTEAL TENDON REPAIR ;  Surgeon: Gaynelle Arabian, MD;  Location: [facility];  Service: Orthopedics;  Laterality: Left;  . thumb surgery    . TOTAL KNEE ARTHROPLASTY Right 06/17/2017   Procedure: RIGHT TOTAL KNEE ARTHROPLASTY;  Surgeon: Gaynelle Arabian, MD;  Location: [facility];  Service: Orthopedics;  Laterality: Right;  . TUBAL LIGATION      OB History    No data available       Home Medications    Prior to Admission medications     Medication Sig Start Date End Date Taking? Authorizing Provider  acetaminophen (TYLENOL) 500 MG tablet Take 1,000 mg by mouth every 8 (eight) hours as needed for mild pain or headache.    [provider]  atorvastatin (LIPITOR) 20 MG tablet Take 1 tablet (20 mg total) by mouth every morning. 09/19/16   Forrest Moron, MD  buPROPion (WELLBUTRIN XL) 300 MG 24 hr tablet Take 1 tablet (300 mg total) by mouth daily. 09/19/16   Forrest Moron, MD  diphenoxylate-atropine (LOMOTIL) 2.5-0.025 MG per tablet Take 1 tablet by mouth 4 (four) times daily as needed for diarrhea or loose stools.    [provider]  fluorouracil (EFUDEX) 5 % cream Apply 1 application topically See admin instructions. In January applies daily for 3 weeks then off for 1-3 weeks then resume for 3 weeks. "For skin cancers" 05/31/15   [provider]  furosemide (LASIX) 40 MG tablet Take 1 tablet (40 mg total) by mouth daily. 09/19/16   Forrest Moron, MD  HYDROcodone-acetaminophen (NORCO/VICODIN) 5-325 MG tablet Take 1-2 tablets by mouth every 6 (six) hours as needed. 09/01/17   Forde Dandy, MD  HYDROmorphone (DILAUDID) 2 MG tablet Take 1-2 tablets (2-4 mg total) by mouth every 4 (four) hours as needed for moderate pain or severe pain. 06/18/17   Perkins, Alexzandrew L, PA-C  loratadine (CLARITIN) 10 MG tablet Take 10 mg by mouth daily.     [provider]  methocarbamol (ROBAXIN) 500 MG tablet Take 1 tablet (500 mg total) by mouth every 6 (six) hours as needed for muscle spasms. 06/18/17   Perkins, Alexzandrew L, PA-C  niacinamide 500 MG tablet Take 500 mg by mouth 2 (two) times daily with a meal.    [provider]  predniSONE (STERAPRED UNI-PAK 21 TAB) 10 MG (21) TBPK tablet Take by mouth daily. Take 6 tabs by mouth daily  for 2 days, then 5 tabs for 2 days, then 4 tabs for 2 days, then 3 tabs for 2 days, 2 tabs for 2 days, then 1 tab by mouth daily for 2 days 09/01/17   Forde Dandy, MD   promethazine (PHENERGAN) 25 MG tablet Take 0.5 tablets (12.5 mg total) by mouth every 6 (six) hours as needed for nausea (migraines). Patient taking differently: Take 6.25-12.5 mg by mouth every 6 (six) hours as needed for nausea (migraines).  07/04/15   Gaynelle Arabian, MD  rivaroxaban (XARELTO) 10 MG TABS tablet Take 1 tablet (10 mg total) by mouth daily with breakfast. Take Xarelto for two and a half more weeks following discharge from the hospital, then discontinue Xarelto. Once the patient  has completed the blood thinner regimen, then take a Baby 81 mg Aspirin daily for three more weeks. 06/19/17   Perkins, Alexzandrew L, PA-C  Simethicone (PHAZYME PO) Take 1 tablet by mouth every morning.     [provider]  traMADol (ULTRAM) 50 MG tablet Take 1-2 tablets (50-100 mg total) by mouth every 6 (six) hours as needed for moderate pain. 06/18/17   Perkins, Alexzandrew L, PA-C    Family History Family History  Problem Relation Age of Onset  . Hypertension Mother   . Heart disease Mother   . Stomach cancer Father 81  . Cancer Father        stomach and liver cancer  . Diabetes Brother   . Heart disease Brother   . Hypertension Brother   . Cancer Daughter        breast cancer  . Hypertension Daughter   . Hypertension Brother   . Heart disease Brother   . Cancer Sister   . Colon cancer Neg Hx     Social History Social History   Tobacco Use  . Smoking status: Never Smoker  . Smokeless tobacco: Never Used  . 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 Alcohol use: Yes    Alcohol/week: 0.0 oz    Comment: occasional/ 2 drinks a month  . Drug use: No     Allergies   Nsaids   Review of Systems Review of Systems  Constitutional: Negative for fever.  Musculoskeletal: Positive for neck pain.  Neurological: Negative for weakness and numbness.  All other systems reviewed and  are negative.    Physical Exam Updated Vital Signs BP 135/79 (BP Location: Left Arm)   Pulse 90   Temp (!) 97.3 F (36.3 C) (Oral)   Resp 20   SpO2 94%   Physical Exam Physical Exam  Nursing note and vitals reviewed. Constitutional: Well developed, well nourished, non-toxic, and in no acute distress Head: Normocephalic and atraumatic.  Mouth/Throat: Oropharynx is clear and moist.  Neck: Normal range of motion. Neck supple. No significant reproducible tenderness with palpation of the neck or trapezius muscles. Cardiovascular: Normal rate and regular rhythm.   Pulmonary/Chest: Effort normal and breath sounds normal.  Abdominal: Soft. There is no tenderness. There is no rebound and no guarding.  Musculoskeletal: Normal range of motion.  Neurological: Alert, no facial droop, fluent speech, moves all extremities symmetrically, full strength and sensation in bilateral upper and lower extremities. Skin: Skin is warm and dry.  Psychiatric: Cooperative   ED Treatments / Results  Labs (all labs ordered are listed, but only abnormal results are displayed) Labs Reviewed - No data to display  EKG  EKG Interpretation None       Radiology Ct Cervical Spine Wo Contrast  Result Date: 09/01/2017 CLINICAL DATA:  New onset neck pain after jerking injury. Initial encounter. EXAM: CT CERVICAL SPINE WITHOUT CONTRAST TECHNIQUE: Multidetector CT imaging of the cervical spine was performed without intravenous contrast. Multiplanar CT image reconstructions were also generated. COMPARISON:  None. FINDINGS: Alignment: No traumatic malalignment. Skull base and vertebrae: Negative for fracture Soft tissues and spinal canal: No prevertebral fluid or swelling. No visible canal hematoma. Disc levels: C5-6 and C6-7 ACDF with solid arthrodesis. Moderate to advanced disc narrowing at C4-5 with endplate degeneration and asymmetric left facet spurring. No visible cord impingement Upper chest: Negative  IMPRESSION: 1. No acute finding. 2.  C5-6 and C6-7 ACDF with solid arthrodesis. 3. C4-5 moderate disc and left facet degeneration. Electronically Signed   By: Monte Fantasia M.D.   On: 09/01/2017 08:31    Procedures Procedures (including critical care time)  Medications Ordered in ED Medications  methocarbamol (ROBAXIN) 1,000 mg in dextrose 5 % 50 mL IVPB (1,000 mg Intravenous New Bag/Given 09/01/17 0813)  dexamethasone (DECADRON) injection 10 mg (10 mg Intravenous Given 09/01/17 0812)  HYDROmorphone (DILAUDID) injection 1 mg (1 mg Intravenous Given 09/01/17 5462)     Initial Impression / Assessment and Plan / ED Course  I have reviewed the triage vital signs and the nursing notes.  Pertinent labs & imaging results that were available during my care of the patient were reviewed by me and considered in my medical decision making (see chart for details).     66 year old female with previous cervical neck fusion who presents with neck pain.  She is nontoxic in no acute distress with stable vital signs.  She has an intact neurological exam.  No concerning features to suggest spinal cord impingement.  Presentation likely related to radiculopathy.  CT cervical spine visualized, and shows disc narrowing at C4-C5 with some left-sided facet arthropathy which is likely causing some of her symptoms.  Patient will be started started on steroids pain medications and muscle relaxants.  She will follow-up with her orthopedic surgeon or neurosurgeon. Strict return and follow-up instructions reviewed. She expressed understanding of all discharge instructions and felt comfortable with the plan of care.   Final Clinical Impressions(s) / ED Diagnoses   Final diagnoses:  Neck pain  Cervical radiculopathy    ED Discharge Orders        Ordered    predniSONE (STERAPRED UNI-PAK 21 TAB) 10 MG (21) TBPK tablet  Daily     09/01/17 0845    HYDROcodone-acetaminophen (NORCO/VICODIN) 5-325 MG tablet  Every 6  hours PRN     09/01/17 0845       Forde Dandy, MD 09/01/17 [id number]

## 2017-09-05 DIAGNOSIS — M5412 Radiculopathy, cervical region: Secondary | ICD-10-CM | POA: Diagnosis not present

## 2017-09-16 DIAGNOSIS — H5051 Esophoria: Secondary | ICD-10-CM | POA: Diagnosis not present

## 2017-09-19 DIAGNOSIS — Z01419 Encounter for gynecological examination (general) (routine) without abnormal findings: Secondary | ICD-10-CM | POA: Diagnosis not present

## 2017-09-19 DIAGNOSIS — Z6828 Body mass index (BMI) 28.0-28.9, adult: Secondary | ICD-10-CM | POA: Diagnosis not present

## 2017-09-19 DIAGNOSIS — N760 Acute vaginitis: Secondary | ICD-10-CM | POA: Diagnosis not present

## 2017-09-20 DIAGNOSIS — M961 Postlaminectomy syndrome, not elsewhere classified: Secondary | ICD-10-CM | POA: Diagnosis not present

## 2017-09-20 DIAGNOSIS — M5412 Radiculopathy, cervical region: Secondary | ICD-10-CM | POA: Diagnosis not present

## 2017-09-23 ENCOUNTER — Encounter: Payer: Self-pay | Admitting: Family Medicine

## 2017-09-23 ENCOUNTER — Ambulatory Visit (INDEPENDENT_AMBULATORY_CARE_PROVIDER_SITE_OTHER): Payer: Medicare Other | Admitting: Family Medicine

## 2017-09-23 ENCOUNTER — Other Ambulatory Visit: Payer: Self-pay

## 2017-09-23 ENCOUNTER — Encounter: Payer: Medicare Other | Admitting: Family Medicine

## 2017-09-23 ENCOUNTER — Ambulatory Visit (INDEPENDENT_AMBULATORY_CARE_PROVIDER_SITE_OTHER): Payer: Medicare Other

## 2017-09-23 VITALS — BP 130/80 | HR 91 | Ht 67.0 in | Wt 181.5 lb

## 2017-09-23 VITALS — BP 130/80 | HR 91 | Ht 67.0 in | Wt 181.8 lb

## 2017-09-23 DIAGNOSIS — F5101 Primary insomnia: Secondary | ICD-10-CM

## 2017-09-23 DIAGNOSIS — Z7184 Encounter for health counseling related to travel: Secondary | ICD-10-CM

## 2017-09-23 DIAGNOSIS — E782 Mixed hyperlipidemia: Secondary | ICD-10-CM | POA: Diagnosis not present

## 2017-09-23 DIAGNOSIS — Z8349 Family history of other endocrine, nutritional and metabolic diseases: Secondary | ICD-10-CM

## 2017-09-23 DIAGNOSIS — I10 Essential (primary) hypertension: Secondary | ICD-10-CM

## 2017-09-23 DIAGNOSIS — D508 Other iron deficiency anemias: Secondary | ICD-10-CM | POA: Diagnosis not present

## 2017-09-23 DIAGNOSIS — Z0000 Encounter for general adult medical examination without abnormal findings: Secondary | ICD-10-CM

## 2017-09-23 DIAGNOSIS — R946 Abnormal results of thyroid function studies: Secondary | ICD-10-CM | POA: Diagnosis not present

## 2017-09-23 DIAGNOSIS — Z7189 Other specified counseling: Secondary | ICD-10-CM | POA: Diagnosis not present

## 2017-09-23 DIAGNOSIS — R9389 Abnormal findings on diagnostic imaging of other specified body structures: Secondary | ICD-10-CM

## 2017-09-23 MED ORDER — CIPROFLOXACIN HCL 500 MG PO TABS: 500.0000 mg | ORAL_TABLET | Freq: Two times a day (BID) | ORAL | 0 refills | Status: DC

## 2017-09-23 MED ORDER — BUPROPION HCL ER (XL) 300 MG PO TB24: 300.0000 mg | ORAL_TABLET | Freq: Every day | ORAL | 3 refills | Status: DC

## 2017-09-23 MED ORDER — ATORVASTATIN CALCIUM 20 MG PO TABS: 20.0000 mg | ORAL_TABLET | Freq: Every morning | ORAL | 3 refills | Status: DC

## 2017-09-23 MED ORDER — PROMETHAZINE HCL 12.5 MG PO TABS: 6.2500 mg | ORAL_TABLET | Freq: Four times a day (QID) | ORAL | 2 refills | Status: DC | PRN

## 2017-09-23 MED ORDER — FUROSEMIDE 40 MG PO TABS: 40.0000 mg | ORAL_TABLET | Freq: Every day | ORAL | 3 refills | Status: DC

## 2017-09-23 NOTE — Progress Notes (Signed)
Subjective:   [name] is a 67 y.o. female who presents for Medicare Annual (Subsequent) preventive examination.  Review of Systems:  N/A Cardiac Risk Factors include: advanced age (>37men, >39 women);dyslipidemia;hypertension     Objective:     Vitals: BP 130/80   Pulse 91   Ht 5\' 7"  (1.702 m)   Wt 181 lb 8 oz (82.3 kg)   SpO2 96%   BMI 28.43 kg/m   Body mass index is 28.43 kg/m.  Advanced Directives 09/23/2017 06/17/2017 06/10/2017 09/19/2016 07/04/2015 06/30/2015 08/10/2011  Does Patient Have a Medical Advance Directive? Yes Yes Yes Yes Yes Yes Patient does not have advance directive  Type of Advance Directive [place];Living will [place];Living will [place];Living will - [place];Living will Living will;Healthcare Power of Attorney -  Does patient want to make changes to medical advance directive? - No - Patient declined No - Patient declined - - - -  Copy of [place] in Chart? 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 - enjoys golf   Clinical Intake:  Pre-visit preparation completed: Yes  Pain : 0-10 Pain Score: 5  Pain Type: Chronic pain Pain Location: Neck Pain Descriptors / Indicators: Aching Pain Onset: More than a month ago Pain Frequency: Intermittent     Nutritional Status: BMI 25 -29 Overweight Nutritional Risks: Nausea/ vomitting/ diarrhea(Slight nausea with headaches only ) Diabetes: No  How often do you need to have someone help you  when you read instructions, pamphlets, or other written materials from your doctor or pharmacy?: 1 - Never What is the last grade level you completed in school?: Some College  Interpreter Needed?: No  Information entered by :: Andrez Grime, LPN  Past Medical History:  Diagnosis Date  . Arthritis    knees, hands  . Bursitis of left hip   . Cancer ([place])    hx skin cancer  . Depression   . GERD (gastroesophageal reflux disease)    INFREQUENT  . Headache    HX MIGRAINES   . Heart murmur     MVP 30 yrs ago - no treatment needed  . Hyperlipidemia    on statin  . Hypertension    "flucuations" has never been on any med  . Insomnia   . Irritable bowel syndrome    s/p subtotal colectomy '86>diarrhea prone  . NEOPLASM, MALIGNANT, BASAL CELL, CARCINOMA, HX OF   . Seasonal allergies    Past Surgical History:  Procedure Laterality Date  . ABDOMINOPLASTY    . [place]   lower back  . BREAST ENHANCEMENT SURGERY  1983  . BREAST IMPLANT REMOVAL  08/2010  . BREAST SURGERY     Augmentation in 1983, Reduction 2011  . BUNIONECTOMY     right  and left foot  . CERVICAL SPINE SURGERY  12/18/1999   C5/6 and C6/7  . COLECTOMY  1986  . COSMETIC SURGERY    . EXCISION/RELEASE BURSA HIP  Left 07/04/2015   Procedure: LEFT HIP BURSECTOMY WITH ;  Surgeon: Gaynelle Arabian, MD;  [facility];  Service: Orthopedics;  Laterality: Left;  . EYE SURGERY     Lasik 2010  . eyelid surgery    . FRACTURE SURGERY    . HYSTEROSCOPY W/D&C  08/17/2011   Procedure: DILATATION AND CURETTAGE (D&C) /HYSTEROSCOPY;  Surgeon: Margarette Asal;  Location: [facility];  Service: Gynecology;  Laterality: N/A;  . INJECTION KNEE Right 07/04/2015   Procedure: CORTISONE INJECTION RIGHT KNEE ;  Surgeon: Gaynelle Arabian, MD;  [facility];  Service: Orthopedics;  Laterality: Right;  . KNEE ARTHROSCOPY     right x 2  . KNEE RECONSTRUCTION, MEDIAL PATELLAR FEMORAL LIGAMENT     right  . LASIK  2009    bilateral    . left foot surgery     removed foreign object  . OPEN SURGICAL REPAIR OF GLUTEAL TENDON Left 07/04/2015   Procedure: GLUTEAL TENDON REPAIR ;  Surgeon: Gaynelle Arabian, MD;  [facility];  Service: Orthopedics;  Laterality: Left;  . thumb surgery    . TOTAL KNEE ARTHROPLASTY Right 06/17/2017   Procedure: RIGHT TOTAL KNEE ARTHROPLASTY;  Surgeon: Gaynelle Arabian, MD;  [facility];  Service: Orthopedics;  Laterality: Right;  . TUBAL LIGATION     Family History  Problem Relation Age of Onset  . Hypertension Mother   . Heart disease Mother   . Stomach cancer Father 11  . Cancer Father        stomach and liver cancer  . Diabetes Brother   . Heart disease Brother   . Hypertension Brother   . Cancer Daughter        breast cancer  . Hypertension Daughter   . Hypertension Brother   . Heart disease Brother   . Cancer Sister   . Thyroid disease Sister   . Colon cancer Neg Hx    Social History   Socioeconomic History  . Marital status: Married    Spouse name: None  . Number of children: 2  . Years of education: None  . Highest education level: Some college, no degree  Social Needs  . Financial resource strain: Not hard at all  . Food insecurity - worry: Never true  . Food insecurity - inability: Never true  . Transportation needs - medical: No  . Transportation needs - non-medical: No  Occupational History  . None  Tobacco Use  . Smoking status: Never Smoker  . Smokeless tobacco: Never Used  . 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 Alcohol use: Yes    Alcohol/week: 0.0 oz    Comment: occasional/ 2 drinks a month  . Drug use: No  . Sexual activity: Yes    Birth control/protection: Post-menopausal, Surgical  Other Topics Concern  . None  Social History Narrative  . None    Outpatient Encounter Medications as of 09/23/2017  Medication Sig  . acetaminophen  (TYLENOL) 500 MG tablet Take 1,000 mg by mouth every 8 (eight) hours as needed for mild pain or headache.  Marland Kitchen atorvastatin (LIPITOR) 20 MG tablet Take 1 tablet (20 mg total) by mouth every morning.  Marland Kitchen buPROPion (WELLBUTRIN XL) 300 MG 24 hr tablet Take 1 tablet (300 mg total) by mouth daily.  . diclofenac sodium (VOLTAREN) 1 % GEL Apply topically as needed.  . diphenoxylate-atropine (LOMOTIL) 2.5-0.025  MG per tablet Take 1 tablet by mouth 4 (four) times daily as needed for diarrhea or loose stools.  . fluorouracil (EFUDEX) 5 % cream Apply 1 application topically See admin instructions. In January applies daily for 3 weeks then off for 1-3 weeks then resume for 3 weeks. "For skin cancers"  . furosemide (LASIX) 40 MG tablet Take 1 tablet (40 mg total) by mouth daily.  Marland Kitchen HYDROcodone-acetaminophen (NORCO/VICODIN) 5-325 MG tablet Take 1-2 tablets by mouth every 6 (six) hours as needed.  . loratadine (CLARITIN) 10 MG tablet Take 10 mg by mouth daily.   . Magnesium 300 MG CAPS Take by mouth. 1 tablet daily  . methocarbamol (ROBAXIN) 500 MG tablet Take 1 tablet (500 mg total) by mouth every 6 (six) hours as needed for muscle spasms.  . niacinamide 500 MG tablet Take 500 mg by mouth 2 (two) times daily with a meal.  . promethazine (PHENERGAN) 25 MG tablet Take 0.5 tablets (12.5 mg total) by mouth every 6 (six) hours as needed for nausea (migraines). (Patient taking differently: Take 6.25-12.5 mg by mouth every 6 (six) hours as needed for nausea (migraines). )  . Simethicone (PHAZYME PO) Take 1 tablet by mouth every morning.   . [DISCONTINUED] HYDROmorphone (DILAUDID) 2 MG tablet Take 1-2 tablets (2-4 mg total) by mouth every 4 (four) hours as needed for moderate pain or severe pain.  . [DISCONTINUED] predniSONE (STERAPRED UNI-PAK 21 TAB) 10 MG (21) TBPK tablet Take by mouth daily. Take 6 tabs by mouth daily  for 2 days, then 5 tabs for 2 days, then 4 tabs for 2 days, then 3 tabs for 2 days, 2 tabs for 2 days,  then 1 tab by mouth daily for 2 days  . [DISCONTINUED] rivaroxaban (XARELTO) 10 MG TABS tablet Take 1 tablet (10 mg total) by mouth daily with breakfast. Take Xarelto for two and a half more weeks following discharge from the hospital, then discontinue Xarelto. Once the patient has completed the blood thinner regimen, then take a Baby 81 mg Aspirin daily for three more weeks.  . [DISCONTINUED] traMADol (ULTRAM) 50 MG tablet Take 1-2 tablets (50-100 mg total) by mouth every 6 (six) hours as needed for moderate pain.   No facility-administered encounter medications on file as of 09/23/2017.     Activities of Daily Living In your present state of health, do you have any difficulty performing the following activities: 09/23/2017 06/17/2017  Hearing? Y -  Comment sometimes patient has ringing in her ears -  Vision? Y -  Comment Patient has some double vision issues -  Difficulty concentrating or making decisions? N -  Comment - -  Walking or climbing stairs? N -  Comment - -  Dressing or bathing? N -  Doing errands, shopping? N N  Preparing Food and eating ? N -  Using the Toilet? N -  In the past six months, have you accidently leaked urine? N -  Do you have problems with loss of bowel control? N -  Managing your Medications? N -  Managing your Finances? N -  Housekeeping or managing your Housekeeping? N -  Some recent data might be hidden    Patient Care Team: Forrest Moron, MD as PCP - General (Internal Medicine) Molli Posey, MD (Obstetrics and Gynecology) [facility], Jennefer Bravo, MD (Dermatology) Gaynelle Arabian, MD (Orthopedic Surgery) [place], Heather, OD [facility])    Assessment:   This is a routine wellness examination for [place].  Exercise Activities and Dietary  recommendations Current Exercise Habits: The patient does not participate in regular exercise at present, Exercise limited by: None identified  Goals    . Exercise 3x per week (30 min per time)     Patient  states that she will try to start back exercising on a weekly basis more often.        Fall Risk Fall Risk  09/23/2017 11/26/2016 09/19/2016 09/14/2015  Falls in the past year? No No No No   Is the patient's home free of loose throw rugs in walkways, pet beds, electrical cords, etc?   yes      Grab bars in the bathroom? no      Handrails on the stairs?   yes      Adequate lighting?   yes  Timed Get Up and Go performed: yes, completed within 30 seconds   Depression Screen PHQ 2/9 Scores 09/23/2017 11/26/2016 09/19/2016 09/14/2015  PHQ - 2 Score 0 0 0 0  PHQ- 9 Score 8 - - -     Cognitive Function     6CIT Screen 09/23/2017  What Year? 0 points  What month? 0 points  What time? 0 points  Count back from 20 0 points  Months in reverse 0 points  Repeat phrase 0 points  Total Score 0    Immunization History  Administered Date(s) Administered  . Influenza Split 05/29/2012  . Influenza Whole 06/07/2009, 07/06/2010  . Influenza,inj,Quad PF,6+ Mos 08/03/2013, 06/18/2014, 06/09/2015  . Influenza-Unspecified 06/10/2016  . Pneumococcal Conjugate-13 09/14/2015  . Pneumococcal Polysaccharide-23 09/19/2016  . Tdap 05/29/2012, 04/15/2017  . Zoster 08/03/2013    Qualifies for Shingles Vaccine?Zostavax completed 08/03/2013, advised patient to check with her pharmacy about receiving the Shingrix vaccine  Screening Tests Health Maintenance  Topic Date Due  . DEXA SCAN  06/01/2015  . INFLUENZA VACCINE  11/21/2017 (Originally 04/10/2017)  . MAMMOGRAM  09/21/2018  . COLONOSCOPY  08/01/2022  . TETANUS/TDAP  04/16/2027  . Hepatitis C Screening  Completed  . PNA vac Low Risk Adult  Completed    Cancer Screenings: Lung: Low Dose CT Chest recommended if Age 60-80 years, 30 pack-year currently smoking OR have quit w/in 15years. Patient does not qualify. Breast:  Up to date on Mammogram? Yes, completed 10/08/2016 Up to date of Bone Density/Dexa? No, Patient has appointment scheduled for this at  the end of the month.  Colorectal: colonoscopy completed 08/01/2012  Additional Screenings:  Hepatitis B/HIV/Syphillis: not indicated Hepatitis C Screening: completed 09/19/2016  Patient will have blood work completed at her office visit with Dr. Nolon Rod because she wants her thyroid levels checked along with the routine lab work.      Plan:   I have personally reviewed and noted the following in the patient's chart:   . Medical and social history . Use of alcohol, tobacco or illicit drugs  . Current medications and supplements . Functional ability and status . Nutritional status . Physical activity . Advanced directives . List of other physicians . Hospitalizations, surgeries, and ER visits in previous 12 months . Vitals . Screenings to include cognitive, depression, and falls . Referrals and appointments  In addition, I have reviewed and discussed with patient certain preventive protocols, quality metrics, and best practice recommendations. A written personalized care plan for preventive services as well as general preventive health recommendations were provided to patient.    1. Encounter for Medicare annual wellness exam     Andrez Grime, LPN  9/92/[id number]

## 2017-09-23 NOTE — Progress Notes (Signed)
Chief Complaint  Patient presents with  . Neck Pain    abnormal image of the thyroid    HPI   Pt had appt with Calandra this morning for her annual wellness visit Identified risk factors were advanced age, dyslipidemia, hypertension  Hypertension: Patient here for follow-up of elevated blood pressure. She is exercising and is adherent to low salt diet.  Blood pressure is well controlled at home. Cardiac symptoms none. Patient denies chest pain, chest pressure/discomfort, dyspnea, exertional chest pressure/discomfort, fatigue, irregular heart beat and lower extremity edema.  Cardiovascular risk factors: advanced age (older than 36 for men, 57 for women), dyslipidemia and hypertension. Use of agents associated with hypertension: none. History of target organ damage: none. BP Readings from Last 3 Encounters:  09/23/17 130/80  09/23/17 130/80  09/01/17 129/80    She had a depression screening that showed  Depression screen [facility] 2/9 09/23/2017 09/23/2017 11/26/2016 09/19/2016 09/14/2015  Decreased Interest 0 0 0 0 0  Down, Depressed, Hopeless 0 0 0 0 0  PHQ - 2 Score 0 0 0 0 0  Altered sleeping - 3 - - -  Tired, decreased energy - 2 - - -  Change in appetite - 0 - - -  Feeling bad or failure about yourself  - 0 - - -  Trouble concentrating - 3 - - -  Moving slowly or fidgety/restless - 0 - - -  Suicidal thoughts - 0 - - -  PHQ-9 Score - 8 - - -  Difficult doing work/chores - Somewhat difficult - - -    Cervical radiculopathy She reports that she has a pinched nerve She states that she has numbness on the left side of the face And some issues with her vision  Her eye doctor recommended that she has a discrepancy with the speed of the pupil reaction of the eyes and it is slower on the left  Insomnia She reports that she has insomnia She states that after knee surgery she was on high doses of pain meds She states that she is off pain meds She sleeps about 3 hours and is wide  awake She states that she has previous tried over the counter benadryl She reports that she has a history of depression and is on wellbutrin She reports that she would like to get a better sleep pattern    Past Medical History:  Diagnosis Date  . Arthritis    knees, hands  . Bursitis of left hip   . Cancer ([place])    hx skin cancer  . Depression   . GERD (gastroesophageal reflux disease)    INFREQUENT  . Headache    HX MIGRAINES   . Heart murmur     MVP 30 yrs ago - no treatment needed  . Hyperlipidemia    on statin  . Hypertension    "flucuations" has never been on any med  . Insomnia   . Irritable bowel syndrome    s/p subtotal colectomy '86>diarrhea prone  . NEOPLASM, MALIGNANT, BASAL CELL, CARCINOMA, HX OF   . Seasonal allergies     Current Outpatient Medications  Medication Sig Dispense Refill  . acetaminophen (TYLENOL) 500 MG tablet Take 1,000 mg by mouth every 8 (eight) hours as needed for mild pain or headache.    Marland Kitchen atorvastatin (LIPITOR) 20 MG tablet Take 1 tablet (20 mg total) by mouth every morning. 90 tablet 3  . buPROPion (WELLBUTRIN XL) 300 MG 24 hr tablet Take 1 tablet (300 mg  total) by mouth daily. 90 tablet 3  . diclofenac sodium (VOLTAREN) 1 % GEL Apply topically as needed.    . diphenoxylate-atropine (LOMOTIL) 2.5-0.025 MG per tablet Take 1 tablet by mouth 4 (four) times daily as needed for diarrhea or loose stools.    . fluorouracil (EFUDEX) 5 % cream Apply 1 application topically See admin instructions. In January applies daily for 3 weeks then off for 1-3 weeks then resume for 3 weeks. "For skin cancers"  0  . furosemide (LASIX) 40 MG tablet Take 1 tablet (40 mg total) by mouth daily. 90 tablet 3  . HYDROcodone-acetaminophen (NORCO/VICODIN) 5-325 MG tablet Take 1-2 tablets by mouth every 6 (six) hours as needed. 15 tablet 0  . loratadine (CLARITIN) 10 MG tablet Take 10 mg by mouth daily.     . Magnesium 300 MG CAPS Take by mouth. 1 tablet daily    .  methocarbamol (ROBAXIN) 500 MG tablet Take 1 tablet (500 mg total) by mouth every 6 (six) hours as needed for muscle spasms. 80 tablet 0  . niacinamide 500 MG tablet Take 500 mg by mouth 2 (two) times daily with a meal.    . promethazine (PHENERGAN) 12.5 MG tablet Take 0.5-1 tablets (6.25-12.5 mg total) by mouth every 6 (six) hours as needed for nausea (migraines). 30 tablet 2  . Simethicone (PHAZYME PO) Take 1 tablet by mouth every morning.     . ciprofloxacin (CIPRO) 500 MG tablet Take 1 tablet (500 mg total) by mouth 2 (two) times daily. 6 tablet 0   No current facility-administered medications for this visit.     Allergies:  Allergies  Allergen Reactions  . Nsaids Other (See Comments)    Hurts stomach    Past Surgical History:  Procedure Laterality Date  . ABDOMINOPLASTY    . [place]   lower back  . BREAST ENHANCEMENT SURGERY  1983  . BREAST IMPLANT REMOVAL  08/2010  . BREAST SURGERY     Augmentation in 1983, Reduction 2011  . BUNIONECTOMY     right  and left foot  . CERVICAL SPINE SURGERY  12/18/1999   C5/6 and C6/7  . COLECTOMY  1986  . COSMETIC SURGERY    . EXCISION/RELEASE BURSA HIP Left 07/04/2015   Procedure: LEFT HIP BURSECTOMY WITH ;  Surgeon: Gaynelle Arabian, MD;  [facility];  Service: Orthopedics;  Laterality: Left;  . EYE SURGERY     Lasik 2010  . eyelid surgery    . FRACTURE SURGERY    . HYSTEROSCOPY W/D&C  08/17/2011   Procedure: DILATATION AND CURETTAGE (D&C) /HYSTEROSCOPY;  Surgeon: Margarette Asal;  Location: [place] [facility];  Service: Gynecology;  Laterality: N/A;  . INJECTION KNEE Right 07/04/2015   Procedure: CORTISONE INJECTION RIGHT KNEE ;  Surgeon: Gaynelle Arabian, MD;  [facility];  Service: Orthopedics;  Laterality: Right;  . KNEE ARTHROSCOPY     right x 2  . KNEE RECONSTRUCTION, MEDIAL PATELLAR FEMORAL LIGAMENT     right  . LASIK  2009    bilateral  . left foot surgery     removed foreign object  . OPEN SURGICAL REPAIR OF GLUTEAL  TENDON Left 07/04/2015   Procedure: GLUTEAL TENDON REPAIR ;  Surgeon: Gaynelle Arabian, MD;  [facility];  Service: Orthopedics;  Laterality: Left;  . thumb surgery    . TOTAL KNEE ARTHROPLASTY Right 06/17/2017   Procedure: RIGHT TOTAL KNEE ARTHROPLASTY;  Surgeon: Gaynelle Arabian, MD;  [facility];  Service: Orthopedics;  Laterality: Right;  . TUBAL LIGATION      Social History   Socioeconomic History  . Marital status: Married    Spouse name: None  . Number of children: 2  . Years of education: None  . Highest education level: Some college, no degree  Social Needs  . Financial resource strain: Not hard at all  . Food insecurity - worry: Never true  . Food insecurity - inability: Never true  . Transportation needs - medical: No  . Transportation needs - non-medical: No  Occupational History  . None  Tobacco Use  . Smoking status: Never Smoker  . Smokeless tobacco: Never Used  . 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 Alcohol use: Yes    Alcohol/week: 0.0 oz    Comment: occasional/ 2 drinks a month  . Drug use: No  . Sexual activity: Yes    Birth control/protection: Post-menopausal, Surgical  Other Topics Concern  . None  Social History Narrative  . None    Family History  Problem Relation Age of Onset  . Hypertension Mother   . Heart disease Mother   . Stomach cancer Father 74  . Cancer Father        stomach and liver cancer  . Diabetes Brother   . Heart disease Brother   . Hypertension Brother   . Cancer Daughter        breast cancer  . Hypertension Daughter   . Hypertension Brother   . Heart disease Brother   . Cancer Sister   . Thyroid disease Sister   . Colon cancer Neg Hx      Review of Systems  Constitutional: Negative for chills, fever and weight loss.  Respiratory: Negative for cough, shortness of breath and wheezing.     Cardiovascular: Negative for chest pain, palpitations and leg swelling.  Gastrointestinal: Negative for abdominal pain, nausea and vomiting.  Neurological: Positive for headaches. Negative for dizziness and tingling.  Psychiatric/Behavioral: Negative for depression. The patient is not nervous/anxious.       Objective: Vitals:   09/23/17 1044  BP: 130/80  Pulse: 91  SpO2: 96%  Weight: 181 lb 12.8 oz (82.5 kg)  Height: 5\' 7"  (1.702 m)    Physical Exam  Constitutional: She is oriented to person, place, and time. She appears well-developed and well-nourished.  HENT:  Head: Normocephalic and atraumatic.  Eyes: Conjunctivae and EOM are normal.  Neck: Normal range of motion. No thyromegaly present.  Cardiovascular: Normal rate, regular rhythm and normal heart sounds.  No murmur heard. Pulmonary/Chest: Effort normal and breath sounds normal. No stridor. No respiratory distress.  Neurological: She is alert and oriented to person, place, and time.  Skin: Skin is warm. Capillary refill takes less than 2 seconds.  Psychiatric: She has a normal mood and affect. Her behavior is normal. Judgment and thought content normal.    Assessment and Plan [name] was seen today for neck pain.  Diagnoses and all orders for this visit:  Essential hypertension- bp stable -     Comprehensive metabolic panel  Mixed hyperlipidemia- will check lipids -     Lipid panel -     Comprehensive metabolic panel  Family history of hypothyroidism -     TSH + free T4 -     US Soft Tissue Head/Neck  Abnormal imaging of thyroid- noted on xray of  the neck, normal thyroid exam here, will check levels -     TSH + free T4 -     US Soft Tissue Head/Neck  Primary insomnia- advised sleepy time tea which has valerian root Upon her return and based on labs would advised trazodone or mirtazepine -     Ferritin  Other iron deficiency anemia- will check ferritin -     Ferritin  Travel advice encounter- cipro for  traveler's diarrhea or uti -     ciprofloxacin (CIPRO) 500 MG tablet; Take 1 tablet (500 mg total) by mouth 2 (two) times daily.  Other orders -     Cancel: Flu Vaccine QUAD 36+ mos IM -     atorvastatin (LIPITOR) 20 MG tablet; Take 1 tablet (20 mg total) by mouth every morning. -     buPROPion (WELLBUTRIN XL) 300 MG 24 hr tablet; Take 1 tablet (300 mg total) by mouth daily. -     furosemide (LASIX) 40 MG tablet; Take 1 tablet (40 mg total) by mouth daily. -     promethazine (PHENERGAN) 12.5 MG tablet; Take 0.5-1 tablets (6.25-12.5 mg total) by mouth every 6 (six) hours as needed for nausea (migraines).     [place]

## 2017-09-23 NOTE — Patient Instructions (Addendum)
Sleepy time tea by celestial seasonings Active ingredients valerian root    IF you received an x-ray today, you will receive an invoice from [facility]. Please contact [facility] at [phone] with questions or concerns regarding your invoice.   IF you received labwork today, you will receive an invoice from [place] [facility]. Please contact [facility] at [phone] with questions or concerns regarding your invoice.   Our billing staff will not be able to assist you with questions regarding bills from these companies.  You will be contacted with the lab results as soon as they are available. The fastest way to get your results is to activate your My Chart account. Instructions are located on the last page of this paperwork. If you have not heard from [place] regarding the results in 2 weeks, please contact this office.    Insomnia Insomnia is a sleep disorder that makes it difficult to fall asleep or to stay asleep. Insomnia can cause tiredness (fatigue), low energy, difficulty concentrating, mood swings, and poor performance at work or school. There are three different ways to classify insomnia:  Difficulty falling asleep.  Difficulty staying asleep.  Waking up too early in the morning.  Any type of insomnia can be long-term (chronic) or short-term (acute). Both are common. Short-term insomnia usually lasts for three months or less. Chronic insomnia occurs at least three times a week for longer than three months. What are the causes? Insomnia may be caused by another condition, situation, or substance, such as:  Anxiety.  Certain medicines.  Gastroesophageal reflux disease (GERD) or other gastrointestinal conditions.  Asthma or other breathing conditions.  Restless legs syndrome, sleep apnea, or other sleep disorders.  Chronic pain.  Menopause. This may include hot flashes.  Stroke.  Abuse of alcohol, tobacco, or illegal  drugs.  Depression.  Caffeine.  Neurological disorders, such as Alzheimer disease.  An overactive thyroid (hyperthyroidism).  The cause of insomnia may not be known. What increases the risk? Risk factors for insomnia include:  Gender. Women are more commonly affected than men.  Age. Insomnia is more common as you get older.  Stress. This may involve your professional or personal life.  Income. Insomnia is more common in people with lower income.  Lack of exercise.  Irregular work schedule or night shifts.  Traveling between different time zones.  What are the signs or symptoms? If you have insomnia, trouble falling asleep or trouble staying asleep is the main symptom. This may lead to other symptoms, such as:  Feeling fatigued.  Feeling nervous about going to sleep.  Not feeling rested in the morning.  Having trouble concentrating.  Feeling irritable, anxious, or depressed.  How is this treated? Treatment for insomnia depends on the cause. If your insomnia is caused by an underlying condition, treatment will focus on addressing the condition. Treatment may also include:  Medicines to help you sleep.  Counseling or therapy.  Lifestyle adjustments.  Follow these instructions at home:  Take medicines only as directed by your health care provider.  Keep regular sleeping and waking hours. Avoid naps.  Keep a sleep diary to help you and your health care provider figure out what could be causing your insomnia. Include: ? When you sleep. ? When you wake up during the night. ? How well you sleep. ? How rested you feel the next day. ? Any side effects of medicines you are taking. ? What you eat and drink.  Make your bedroom a comfortable place where it is easy  to fall asleep: ? Put up shades or special blackout curtains to block light from outside. ? Use a white noise machine to block noise. ? Keep the temperature cool.  Exercise regularly as directed by  your health care provider. Avoid exercising right before bedtime.  Use relaxation techniques to manage stress. Ask your health care provider to suggest some techniques that may work well for you. These may include: ? Breathing exercises. ? Routines to release muscle tension. ? Visualizing peaceful scenes.  Cut back on alcohol, caffeinated beverages, and cigarettes, especially close to bedtime. These can disrupt your sleep.  Do not overeat or eat spicy foods right before bedtime. This can lead to digestive discomfort that can make it hard for you to sleep.  Limit screen use before bedtime. This includes: ? Watching TV. ? Using your smartphone, tablet, and computer.  Stick to a routine. This can help you fall asleep faster. Try to do a quiet activity, brush your teeth, and go to bed at the same time each night.  Get out of bed if you are still awake after 15 minutes of trying to sleep. Keep the lights down, but try reading or doing a quiet activity. When you feel sleepy, go back to bed.  Make sure that you drive carefully. Avoid driving if you feel very sleepy.  Keep all follow-up appointments as directed by your health care provider. This is important. Contact a health care provider if:  You are tired throughout the day or have trouble in your daily routine due to sleepiness.  You continue to have sleep problems or your sleep problems get worse. Get help right away if:  You have serious thoughts about hurting yourself or someone else. This information is not intended to replace advice given to you by your health care provider. Make sure you discuss any questions you have with your health care provider. Document Released: 08/24/2000 Document Revised: 01/27/2016 Document Reviewed: 05/28/2014 Elsevier Interactive Patient Education  [employer].

## 2017-09-23 NOTE — Patient Instructions (Addendum)
[name] , Thank you for taking time to come for your Medicare Wellness Visit. I appreciate your ongoing commitment to your health goals. Please review the following plan we discussed and let me know if I can assist you in the future.   Screening recommendations/referrals: Colonoscopy: up to date, next due 08/01/2022 Mammogram: up to date, next due 10/08/2018 Bone Density: You have this already scheduled for this month. Please have report sent to this office Recommended yearly ophthalmology/optometry visit for glaucoma screening and checkup Recommended yearly dental visit for hygiene and checkup.  Vaccinations: Influenza vaccine: You will get this at your pharmacy along with the Shingrix vaccine  Pneumococcal vaccine: up to date Tdap vaccine: up to date, next due 04/16/2027 Shingles vaccine: Check with your pharmacy about receiving the Shingrix vaccine     Advanced directives: Please bring a copy of your POA (Power of [place]) and/or Living Will to your next appointment.   Conditions/risks identified:  Try to start back exercising on a weekly basis more often.   Next appointment: today @ 10:40 am with Dr. Nolon Rod, next Medicare Wellness Visit is 09/24/2018 @ 9:20 am with [place] 65 Years and Older, Female Preventive care refers to lifestyle choices and visits with your health care provider that can promote health and wellness. What does preventive care include?  A yearly physical exam. This is also called an annual well check.  Dental exams once or twice a year.  Routine eye exams. Ask your health care provider how often you should have your eyes checked.  Personal lifestyle choices, including:  Daily care of your teeth and gums.  Regular physical activity.  Eating a healthy diet.  Avoiding tobacco and drug use.  Limiting alcohol use.  Practicing safe sex.  Taking low-dose aspirin every day.  Taking vitamin and mineral supplements as  recommended by your health care provider. What happens during an annual well check? The services and screenings done by your health care provider during your annual well check will depend on your age, overall health, lifestyle risk factors, and family history of disease. Counseling  Your health care provider may ask you questions about your:  Alcohol use.  Tobacco use.  Drug use.  Emotional well-being.  Home and relationship well-being.  Sexual activity.  Eating habits.  History of falls.  Memory and ability to understand (cognition).  Work and work Statistician.  Reproductive health. Screening  You may have the following tests or measurements:  Height, weight, and BMI.  Blood pressure.  Lipid and cholesterol levels. These may be checked every 5 years, or more frequently if you are over 72 years old.  Skin check.  Lung cancer screening. You may have this screening every year starting at age 77 if you have a 30-pack-year history of smoking and currently smoke or have quit within the past 15 years.  Fecal occult blood test (FOBT) of the stool. You may have this test every year starting at age 33.  Flexible sigmoidoscopy or colonoscopy. You may have a sigmoidoscopy every 5 years or a colonoscopy every 10 years starting at age 70.  Hepatitis C blood test.  Hepatitis B blood test.  Sexually transmitted disease (STD) testing.  Diabetes screening. This is done by checking your blood sugar (glucose) after you have not eaten for a while (fasting). You may have this done every 1-3 years.  Bone density scan. This is done to screen for osteoporosis. You may have this done starting  at age 60.  Mammogram. This may be done every 1-2 years. Talk to your health care provider about how often you should have regular mammograms. Talk with your health care provider about your test results, treatment options, and if necessary, the need for more tests. Vaccines  Your health care  provider may recommend certain vaccines, such as:  Influenza vaccine. This is recommended every year.  Tetanus, diphtheria, and acellular pertussis (Tdap, Td) vaccine. You may need a Td booster every 10 years.  Zoster vaccine. You may need this after age 50.  Pneumococcal 13-valent conjugate (PCV13) vaccine. One dose is recommended after age 75.  Pneumococcal polysaccharide (PPSV23) vaccine. One dose is recommended after age 33. Talk to your health care provider about which screenings and vaccines you need and how often you need them. This information is not intended to replace advice given to you by your health care provider. Make sure you discuss any questions you have with your health care provider. Document Released: 09/23/2015 Document Revised: 05/16/2016 Document Reviewed: 06/28/2015 Elsevier Interactive Patient Education  2017 [place] Prevention in the Home Falls can cause injuries. They can happen to people of all ages. There are many things you can do to make your home safe and to help prevent falls. What can I do on the outside of my home?  Regularly fix the edges of walkways and driveways and fix any cracks.  Remove anything that might make you trip as you walk through a door, such as a raised step or threshold.  Trim any bushes or trees on the path to your home.  Use bright outdoor lighting.  Clear any walking paths of anything that might make someone trip, such as rocks or tools.  Regularly check to see if handrails are loose or broken. Make sure that both sides of any steps have handrails.  Any raised decks and porches should have guardrails on the edges.  Have any leaves, snow, or ice cleared regularly.  Use sand or salt on walking paths during winter.  Clean up any spills in your garage right away. This includes oil or grease spills. What can I do in the bathroom?  Use night lights.  Install grab bars by the toilet and in the tub and shower. Do  not use towel bars as grab bars.  Use non-skid mats or decals in the tub or shower.  If you need to sit down in the shower, use a plastic, non-slip stool.  Keep the floor dry. Clean up any water that spills on the floor as soon as it happens.  Remove soap buildup in the tub or shower regularly.  Attach bath mats securely with double-sided non-slip rug tape.  Do not have throw rugs and other things on the floor that can make you trip. What can I do in the bedroom?  Use night lights.  Make sure that you have a light by your bed that is easy to reach.  Do not use any sheets or blankets that are too big for your bed. They should not hang down onto the floor.  Have a firm chair that has side arms. You can use this for support while you get dressed.  Do not have throw rugs and other things on the floor that can make you trip. What can I do in the kitchen?  Clean up any spills right away.  Avoid walking on wet floors.  Keep items that you use a lot in easy-to-reach places.  If  you need to reach something above you, use a strong step stool that has a grab bar.  Keep electrical cords out of the way.  Do not use floor polish or wax that makes floors slippery. If you must use wax, use non-skid floor wax.  Do not have throw rugs and other things on the floor that can make you trip. What can I do with my stairs?  Do not leave any items on the stairs.  Make sure that there are handrails on both sides of the stairs and use them. Fix handrails that are broken or loose. Make sure that handrails are as long as the stairways.  Check any carpeting to make sure that it is firmly attached to the stairs. Fix any carpet that is loose or worn.  Avoid having throw rugs at the top or bottom of the stairs. If you do have throw rugs, attach them to the floor with carpet tape.  Make sure that you have a light switch at the top of the stairs and the bottom of the stairs. If you do not have them,  ask someone to add them for you. What else can I do to help prevent falls?  Wear shoes that:  Do not have high heels.  Have rubber bottoms.  Are comfortable and fit you well.  Are closed at the toe. Do not wear sandals.  If you use a stepladder:  Make sure that it is fully opened. Do not climb a closed stepladder.  Make sure that both sides of the stepladder are locked into place.  Ask someone to hold it for you, if possible.  Clearly mark and make sure that you can see:  Any grab bars or handrails.  First and last steps.  Where the edge of each step is.  Use tools that help you move around (mobility aids) if they are needed. These include:  Canes.  Walkers.  Scooters.  Crutches.  Turn on the lights when you go into a dark area. Replace any light bulbs as soon as they burn out.  Set up your furniture so you have a clear path. Avoid moving your furniture around.  If any of your floors are uneven, fix them.  If there are any pets around you, be aware of where they are.  Review your medicines with your doctor. Some medicines can make you feel dizzy. This can increase your chance of falling. Ask your doctor what other things that you can do to help prevent falls. This information is not intended to replace advice given to you by your health care provider. Make sure you discuss any questions you have with your health care provider. Document Released: 06/23/2009 Document Revised: 02/02/2016 Document Reviewed: 10/01/2014 Elsevier Interactive Patient Education  2017 [employer].

## 2017-09-24 LAB — COMPREHENSIVE METABOLIC PANEL
ALT: 16 IU/L (ref 0–32)
AST: 16 IU/L (ref 0–40)
Albumin/Globulin Ratio: 1.9 (ref 1.2–2.2)
Albumin: 4.4 g/dL (ref 3.6–4.8)
Alkaline Phosphatase: 80 IU/L (ref 39–117)
BUN/Creatinine Ratio: 15 (ref 12–28)
BUN: 15 mg/dL (ref 8–27)
Bilirubin Total: 0.4 mg/dL (ref 0.0–1.2)
CALCIUM: 9.5 mg/dL (ref 8.7–10.3)
CO2: 22 mmol/L (ref 20–29)
CREATININE: 0.97 mg/dL (ref 0.57–1.00)
Chloride: 105 mmol/L (ref 96–106)
GFR calc Af Amer: 70 mL/min/{1.73_m2} (ref >59)
GFR, EST NON AFRICAN AMERICAN: 61 mL/min/{1.73_m2} (ref >59)
GLUCOSE: 87 mg/dL (ref 65–99)
Globulin, Total: 2.3 g/dL (ref 1.5–4.5)
Potassium: 4.3 mmol/L (ref 3.5–5.2)
Sodium: 143 mmol/L (ref 134–144)
Total Protein: 6.7 g/dL (ref 6.0–8.5)

## 2017-09-24 LAB — LIPID PANEL
CHOL/HDL RATIO: 2.7 ratio (ref 0.0–4.4)
Cholesterol, Total: 186 mg/dL (ref 100–199)
HDL: 69 mg/dL (ref >39)
LDL CALC: 59 mg/dL (ref 0–99)
Triglycerides: 290 mg/dL — ABNORMAL HIGH (ref 0–149)
VLDL CHOLESTEROL CAL: 58 mg/dL — AB (ref 5–40)

## 2017-09-24 LAB — TSH+FREE T4
FREE T4: 1.31 ng/dL (ref 0.82–1.77)
TSH: 2.09 u[IU]/mL (ref 0.450–4.500)

## 2017-09-24 LAB — FERRITIN: FERRITIN: 110 ng/mL (ref 15–150)

## 2017-09-26 DIAGNOSIS — E039 Hypothyroidism, unspecified: Secondary | ICD-10-CM | POA: Diagnosis not present

## 2017-10-02 ENCOUNTER — Telehealth: Payer: Self-pay | Admitting: Family Medicine

## 2017-10-02 ENCOUNTER — Other Ambulatory Visit: Payer: Self-pay | Admitting: *Deleted

## 2017-10-02 MED ORDER — ATORVASTATIN CALCIUM 20 MG PO TABS: 20.0000 mg | ORAL_TABLET | Freq: Every morning | ORAL | 3 refills | Status: DC

## 2017-10-02 MED ORDER — PROMETHAZINE HCL 12.5 MG PO TABS: 6.2500 mg | ORAL_TABLET | Freq: Four times a day (QID) | ORAL | 2 refills | Status: DC | PRN

## 2017-10-02 MED ORDER — FUROSEMIDE 40 MG PO TABS: 40.0000 mg | ORAL_TABLET | Freq: Every day | ORAL | 3 refills | Status: DC

## 2017-10-02 MED ORDER — BUPROPION HCL ER (XL) 300 MG PO TB24: 300.0000 mg | ORAL_TABLET | Freq: Every day | ORAL | 3 refills | Status: DC

## 2017-10-02 NOTE — Telephone Encounter (Signed)
Change in pharmacy for patient- no change in Rx

## 2017-10-02 NOTE — Telephone Encounter (Signed)
Copied from [place] [phone]. Topic: Quick Communication - Rx Refill/Question >> Oct 02, 2017  9:18 AM Scherrie Gerlach wrote: Medication atorvastatin (LIPITOR) 20 MG tablet buPROPion (WELLBUTRIN XL) 300 MG 24 hr table furosemide (LASIX) 40 MG tablet promethazine (PHENERGAN) 12.5 MG tablet  (90 tabs) (needs to be changed to a 90 day instead of 30 day w/ refills)    Has the patient contacted their pharmacy? yes  but [facility].  [facility] needs something special Please resend these to  [place], [place] [phone] (Phone) [phone] (Fax)

## 2017-10-03 ENCOUNTER — Ambulatory Visit
Admission: RE | Admit: 2017-10-03 | Discharge: 2017-10-03 | Disposition: A | Discharge status: Home / Self Care | Payer: Medicare Other | Source: Ambulatory Visit | Attending: Family Medicine | Admitting: Family Medicine

## 2017-10-03 DIAGNOSIS — E039 Hypothyroidism, unspecified: Secondary | ICD-10-CM | POA: Diagnosis not present

## 2017-10-07 ENCOUNTER — Ambulatory Visit
Admission: RE | Admit: 2017-10-07 | Discharge: 2017-10-07 | Disposition: A | Discharge status: Home / Self Care | Payer: Medicare Other | Source: Ambulatory Visit | Attending: Family Medicine | Admitting: Family Medicine

## 2017-10-07 DIAGNOSIS — Z1231 Encounter for screening mammogram for malignant neoplasm of breast: Secondary | ICD-10-CM | POA: Diagnosis not present

## 2017-10-08 DIAGNOSIS — N958 Other specified menopausal and perimenopausal disorders: Secondary | ICD-10-CM | POA: Diagnosis not present

## 2017-10-08 DIAGNOSIS — M8588 Other specified disorders of bone density and structure, other site: Secondary | ICD-10-CM | POA: Diagnosis not present

## 2017-10-09 DIAGNOSIS — Z23 Encounter for immunization: Secondary | ICD-10-CM | POA: Diagnosis not present

## 2017-10-18 DIAGNOSIS — M47812 Spondylosis without myelopathy or radiculopathy, cervical region: Secondary | ICD-10-CM | POA: Diagnosis not present

## 2017-10-18 DIAGNOSIS — M5031 Other cervical disc degeneration,  high cervical region: Secondary | ICD-10-CM | POA: Diagnosis not present

## 2017-11-01 DIAGNOSIS — M5412 Radiculopathy, cervical region: Secondary | ICD-10-CM | POA: Diagnosis not present

## 2017-11-01 DIAGNOSIS — H532 Diplopia: Secondary | ICD-10-CM | POA: Diagnosis not present

## 2017-11-06 ENCOUNTER — Ambulatory Visit: Payer: Medicare Other | Admitting: Family Medicine

## 2017-11-08 DIAGNOSIS — M542 Cervicalgia: Secondary | ICD-10-CM | POA: Diagnosis not present

## 2017-11-08 DIAGNOSIS — M5412 Radiculopathy, cervical region: Secondary | ICD-10-CM | POA: Diagnosis not present

## 2017-11-20 DIAGNOSIS — M542 Cervicalgia: Secondary | ICD-10-CM | POA: Insufficient documentation

## 2017-11-20 DIAGNOSIS — M961 Postlaminectomy syndrome, not elsewhere classified: Secondary | ICD-10-CM | POA: Insufficient documentation

## 2017-11-20 DIAGNOSIS — M5030 Other cervical disc degeneration, unspecified cervical region: Secondary | ICD-10-CM | POA: Diagnosis not present

## 2017-11-20 DIAGNOSIS — H539 Unspecified visual disturbance: Secondary | ICD-10-CM | POA: Diagnosis not present

## 2017-11-25 DIAGNOSIS — D0471 Carcinoma in situ of skin of right lower limb, including hip: Secondary | ICD-10-CM | POA: Diagnosis not present

## 2017-11-25 DIAGNOSIS — D1801 Hemangioma of skin and subcutaneous tissue: Secondary | ICD-10-CM | POA: Diagnosis not present

## 2017-11-25 DIAGNOSIS — C44729 Squamous cell carcinoma of skin of left lower limb, including hip: Secondary | ICD-10-CM | POA: Diagnosis not present

## 2017-11-25 DIAGNOSIS — D225 Melanocytic nevi of trunk: Secondary | ICD-10-CM | POA: Diagnosis not present

## 2017-11-25 DIAGNOSIS — L821 Other seborrheic keratosis: Secondary | ICD-10-CM | POA: Diagnosis not present

## 2017-11-25 DIAGNOSIS — Z23 Encounter for immunization: Secondary | ICD-10-CM | POA: Diagnosis not present

## 2017-11-25 DIAGNOSIS — Z86018 Personal history of other benign neoplasm: Secondary | ICD-10-CM | POA: Diagnosis not present

## 2017-11-25 DIAGNOSIS — Z85828 Personal history of other malignant neoplasm of skin: Secondary | ICD-10-CM | POA: Diagnosis not present

## 2017-11-25 DIAGNOSIS — L814 Other melanin hyperpigmentation: Secondary | ICD-10-CM | POA: Diagnosis not present

## 2017-11-25 DIAGNOSIS — D485 Neoplasm of uncertain behavior of skin: Secondary | ICD-10-CM | POA: Diagnosis not present

## 2017-11-25 DIAGNOSIS — L570 Actinic keratosis: Secondary | ICD-10-CM | POA: Diagnosis not present

## 2017-11-26 DIAGNOSIS — C44729 Squamous cell carcinoma of skin of left lower limb, including hip: Secondary | ICD-10-CM | POA: Diagnosis not present

## 2017-11-26 DIAGNOSIS — D485 Neoplasm of uncertain behavior of skin: Secondary | ICD-10-CM | POA: Diagnosis not present

## 2017-11-26 DIAGNOSIS — D0471 Carcinoma in situ of skin of right lower limb, including hip: Secondary | ICD-10-CM | POA: Diagnosis not present

## 2017-12-02 DIAGNOSIS — M542 Cervicalgia: Secondary | ICD-10-CM | POA: Diagnosis not present

## 2017-12-06 ENCOUNTER — Other Ambulatory Visit: Payer: Self-pay | Admitting: Ophthalmology

## 2017-12-06 DIAGNOSIS — H4911 Fourth [trochlear] nerve palsy, right eye: Secondary | ICD-10-CM | POA: Diagnosis not present

## 2017-12-06 DIAGNOSIS — H4910 Fourth [trochlear] nerve palsy, unspecified eye: Secondary | ICD-10-CM | POA: Diagnosis not present

## 2017-12-09 ENCOUNTER — Other Ambulatory Visit: Payer: Self-pay | Admitting: Ophthalmology

## 2017-12-09 DIAGNOSIS — H4911 Fourth [trochlear] nerve palsy, right eye: Secondary | ICD-10-CM

## 2017-12-14 ENCOUNTER — Ambulatory Visit
Admission: RE | Admit: 2017-12-14 | Discharge: 2017-12-14 | Disposition: A | Discharge status: Home / Self Care | Payer: Medicare Other | Source: Ambulatory Visit | Attending: Ophthalmology | Admitting: Ophthalmology

## 2017-12-14 ENCOUNTER — Other Ambulatory Visit: Payer: Medicare Other

## 2017-12-14 DIAGNOSIS — H4911 Fourth [trochlear] nerve palsy, right eye: Secondary | ICD-10-CM

## 2017-12-14 DIAGNOSIS — H532 Diplopia: Secondary | ICD-10-CM | POA: Diagnosis not present

## 2017-12-14 MED ORDER — GADOBENATE DIMEGLUMINE 529 MG/ML IV SOLN
15.0000 mL | Freq: Once | INTRAVENOUS | Status: AC | PRN
  Administered 2017-12-14: 15 mL via INTRAVENOUS

## 2017-12-19 DIAGNOSIS — M1711 Unilateral primary osteoarthritis, right knee: Secondary | ICD-10-CM | POA: Diagnosis not present

## 2017-12-19 DIAGNOSIS — H4911 Fourth [trochlear] nerve palsy, right eye: Secondary | ICD-10-CM | POA: Diagnosis not present

## 2017-12-19 DIAGNOSIS — Z471 Aftercare following joint replacement surgery: Secondary | ICD-10-CM | POA: Diagnosis not present

## 2017-12-19 DIAGNOSIS — Z96651 Presence of right artificial knee joint: Secondary | ICD-10-CM | POA: Diagnosis not present

## 2017-12-24 ENCOUNTER — Other Ambulatory Visit: Payer: Self-pay | Admitting: Neurological Surgery

## 2018-01-14 ENCOUNTER — Other Ambulatory Visit: Payer: Self-pay | Admitting: Neurosurgery

## 2018-01-14 DIAGNOSIS — I10 Essential (primary) hypertension: Secondary | ICD-10-CM | POA: Diagnosis not present

## 2018-01-14 DIAGNOSIS — H4911 Fourth [trochlear] nerve palsy, right eye: Secondary | ICD-10-CM | POA: Diagnosis not present

## 2018-01-14 DIAGNOSIS — Z6828 Body mass index (BMI) 28.0-28.9, adult: Secondary | ICD-10-CM | POA: Diagnosis not present

## 2018-01-14 DIAGNOSIS — M542 Cervicalgia: Secondary | ICD-10-CM | POA: Diagnosis not present

## 2018-01-20 ENCOUNTER — Ambulatory Visit (INDEPENDENT_AMBULATORY_CARE_PROVIDER_SITE_OTHER): Payer: Medicare Other | Admitting: Diagnostic Neuroimaging

## 2018-01-20 ENCOUNTER — Encounter: Payer: Self-pay | Admitting: Diagnostic Neuroimaging

## 2018-01-20 ENCOUNTER — Encounter

## 2018-01-20 VITALS — BP 135/78 | HR 90 | Ht 67.0 in | Wt 181.6 lb

## 2018-01-20 DIAGNOSIS — H532 Diplopia: Secondary | ICD-10-CM

## 2018-01-20 NOTE — Patient Instructions (Signed)
-   check labs for myasthenia gravis

## 2018-01-20 NOTE — Progress Notes (Signed)
[facility]  PATIENT: [name] DOB: Oct 14, 1950  REFERRING CLINICIAN: Lu Duffel, PA HISTORY FROM: patient and new consult  REASON FOR VISIT: new consult    HISTORICAL  CHIEF COMPLAINT:  Chief Complaint  Patient presents with  . NP Levy Pupa, PA  . Monocular diploplia    HISTORY OF PRESENT ILLNESS:   67 year old female here for evaluation of double vision.  September 2018 patient had onset of intermittent double vision, sometimes looking to the left, sometimes looking to the right, sometimes looking straight ahead.  Patient feels like the problem occurred when both eyes are open, but improves when she closes either right or left eye.  Symptoms are occurring almost on daily basis but have slightly improved.  Now the episodes are occurring 3-5 times per week.  Symptoms may last just a few seconds a few minutes at a time.  No drooping eyelids, slurred speech, trouble swallowing, muscle weakness in arms or legs.  Patient has seen ophthalmologist Dr. Valetta Close, who ordered MRI brain and orbits which were unremarkable.  Acetylcholine  receptor antibody was also negative.   REVIEW OF SYSTEMS: Full 14 system review of systems performed and negative with exception of: as per HPI.  ALLERGIES: Allergies  Allergen Reactions  . Nsaids Other (See Comments)    Hurts stomach    HOME MEDICATIONS: Outpatient Medications Prior to Visit  Medication Sig Dispense Refill  . acetaminophen (TYLENOL) 500 MG tablet Take 1,000 mg by mouth every 8 (eight) hours as needed for mild pain or headache.    Marland Kitchen atorvastatin (LIPITOR) 20 MG tablet Take 1 tablet (20 mg total) by mouth every morning. 90 tablet 3  . buPROPion (WELLBUTRIN XL) 300 MG 24 hr tablet Take 1 tablet (300 mg total) by mouth daily. 90 tablet 3  . Calcium-Phosphorus-Vitamin D (CITRACAL +D3 PO) Take by mouth. 1000 iu daily    . cholecalciferol (VITAMIN D) 1000 units tablet Take 1,000 Units by mouth daily.    .  diclofenac sodium (VOLTAREN) 1 % GEL Apply topically as needed.    . diphenoxylate-atropine (LOMOTIL) 2.5-0.025 MG per tablet Take 1 tablet by mouth 4 (four) times daily as needed for diarrhea or loose stools.    . fluorouracil (EFUDEX) 5 % cream Apply 1 application topically See admin instructions. In January applies daily for 3 weeks then off for 1-3 weeks then resume for 3 weeks. "For skin cancers"  0  . furosemide (LASIX) 40 MG tablet Take 1 tablet (40 mg total) by mouth daily. 90 tablet 3  . HYDROcodone-acetaminophen (NORCO/VICODIN) 5-325 MG tablet Take 1-2 tablets by mouth every 6 (six) hours as needed. 15 tablet 0  . loratadine (CLARITIN) 10 MG tablet Take 10 mg by mouth daily.     . Magnesium 300 MG CAPS Take by mouth. 1 tablet daily    . niacinamide 500 MG tablet Take 500 mg by mouth 2 (two) times daily with a meal.    . promethazine (PHENERGAN) 12.5 MG tablet Take 0.5-1 tablets (6.25-12.5 mg total) by mouth every 6 (six) hours as needed for nausea (migraines). 30 tablet 2  . Simethicone (PHAZYME PO) Take 1 tablet by mouth every morning.     . ciprofloxacin (CIPRO) 500 MG tablet Take 1 tablet (500 mg total) by mouth 2 (two) times daily. (Patient not taking: Reported on 01/20/2018) 6 tablet 0  . methocarbamol (ROBAXIN) 500 MG tablet Take 1 tablet (500 mg total) by mouth every 6 (six) hours as needed for muscle  spasms. 80 tablet 0   No facility-administered medications prior to visit.     PAST MEDICAL HISTORY: Past Medical History:  Diagnosis Date  . Arthritis    knees, hands  . Bursitis of left hip   . Cancer ([place])    hx skin cancer  . Depression   . GERD (gastroesophageal reflux disease)    INFREQUENT  . Headache    HX MIGRAINES   . Heart murmur     MVP 30 yrs ago - no treatment needed  . Hyperlipidemia    on statin  . Hypertension    "flucuations" has never been on any med  . Insomnia   . Irritable bowel syndrome    s/p subtotal colectomy '86>diarrhea prone  . NEOPLASM,  MALIGNANT, BASAL CELL, CARCINOMA, HX OF   . Seasonal allergies     PAST SURGICAL HISTORY: Past Surgical History:  Procedure Laterality Date  . ABDOMINOPLASTY    . [place]   lower back  . BREAST ENHANCEMENT SURGERY  1983  . BREAST IMPLANT REMOVAL  08/2010  . BREAST SURGERY     Augmentation in 1983, Reduction 2011  . BUNIONECTOMY     right  and left foot  . CERVICAL SPINE SURGERY  12/18/1999   C5/6 and C6/7  . COLECTOMY  1986  . COSMETIC SURGERY    . EXCISION/RELEASE BURSA HIP Left 07/04/2015   Procedure: LEFT HIP BURSECTOMY WITH ;  Surgeon: Gaynelle Arabian, MD;  Location: [facility];  Service: Orthopedics;  Laterality: Left;  . EYE SURGERY     Lasik 2010  . eyelid surgery    . FRACTURE SURGERY    . HYSTEROSCOPY W/D&C  08/17/2011   Procedure: DILATATION AND CURETTAGE (D&C) /HYSTEROSCOPY;  Surgeon: Margarette Asal;  Location: [facility];  Service: Gynecology;  Laterality: N/A;  . INJECTION KNEE Right 07/04/2015   Procedure: CORTISONE INJECTION RIGHT KNEE ;  Surgeon: Gaynelle Arabian, MD;  Location: [facility];  Service: Orthopedics;  Laterality: Right;  . KNEE ARTHROSCOPY     right x 2  . KNEE RECONSTRUCTION, MEDIAL PATELLAR FEMORAL LIGAMENT     right  . LASIK  2009    bilateral  . left foot surgery     removed foreign object  . OPEN SURGICAL REPAIR OF GLUTEAL TENDON Left 07/04/2015   Procedure: GLUTEAL TENDON REPAIR ;  Surgeon: Gaynelle Arabian, MD;  Location: [facility];  Service: Orthopedics;  Laterality: Left;  . REDUCTION MAMMAPLASTY Bilateral   . thumb surgery    . TOTAL KNEE ARTHROPLASTY Right 06/17/2017   Procedure: RIGHT TOTAL KNEE ARTHROPLASTY;  Surgeon: Gaynelle Arabian, MD;  Location: [facility];  Service: Orthopedics;  Laterality: Right;  . TUBAL LIGATION      FAMILY HISTORY: Family History  Problem Relation Age of Onset  . Hypertension Mother   . Heart disease Mother   . Stomach cancer Father 32  . Cancer Father        stomach and liver cancer  . Diabetes Brother   .  Heart disease Brother   . Hypertension Brother   . Cancer Daughter        breast cancer  . Hypertension Daughter   . Hypertension Brother   . Heart disease Brother   . Cancer Sister   . Thyroid disease Sister   . Colon cancer Neg Hx     SOCIAL HISTORY:  Social History   Socioeconomic History  . Marital status: Married    Spouse name: Not on  file  . Number of children: 2  . Years of education: Not on file  . Highest education level: Some college, no degree  Occupational History  . Not on file  Social Needs  . Financial resource strain: Not hard at all  . Food insecurity:    Worry: Never true    Inability: Never true  . Transportation needs:    Medical: No    Non-medical: No  Tobacco Use  . Smoking status: Never Smoker  . Smokeless tobacco: Never Used  . 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 Alcohol use: Yes    Alcohol/week: 0.0 oz    Comment: occasional/ 2 drinks a month  . Drug use: No  . Sexual activity: Yes    Birth control/protection: Post-menopausal, Surgical  Lifestyle  . Physical activity:    Days per week: 0 days    Minutes per session: 0 min  . Stress: Rather much  Relationships  . Social connections:    Talks on phone: More than three times a week    Gets together: More than three times a week    Attends religious service: Never    Active member of club or organization: No    Attends meetings of clubs or organizations: Never    Relationship status: Married  . Intimate partner violence:    Fear of current or ex partner: No    Emotionally abused: No    Physically abused: No    Forced sexual activity: No  Other Topics Concern  . Not on file  Social History Narrative  . Not on file     PHYSICAL EXAM  GENERAL EXAM/CONSTITUTIONAL: Vitals:  Vitals:   01/20/18 1115  BP: 135/78  Pulse: 90  Weight: 181 lb 9.6 oz (82.4 kg)  Height:  5\' 7"  (1.702 m)     Body mass index is 28.44 kg/m.  No exam data present  Patient is in no distress; well developed, nourished and groomed; neck is supple  CARDIOVASCULAR:  Examination of carotid arteries is normal; no carotid bruits  Regular rate and rhythm, no murmurs  Examination of peripheral vascular system by observation and palpation is normal  EYES:  Ophthalmoscopic exam of optic discs and posterior segments is normal; no papilledema or hemorrhages  MUSCULOSKELETAL:  Gait, strength, tone, movements noted in Neurologic exam below  NEUROLOGIC: MENTAL STATUS:  No flowsheet data found.  awake, alert, oriented to person, place and time  recent and remote memory intact  normal attention and concentration  language fluent, comprehension intact, naming intact,   fund of knowledge appropriate  CRANIAL NERVE:   2nd - no papilledema on fundoscopic exam  2nd, 3rd, 4th, 6th - pupils equal and reactive to light, visual fields full to confrontation, extraocular muscles intact, no nystagmus  5th - facial sensation symmetric  7th - facial strength symmetric  8th - hearing intact  9th - palate elevates symmetrically, uvula midline  11th - shoulder shrug symmetric  12th - tongue protrusion midline  MOTOR:   normal bulk and tone, full strength in the BUE, BLE  SENSORY:   normal and symmetric to light touch, temperature, vibration  COORDINATION:   finger-nose-finger, fine finger movements normal  REFLEXES:   deep tendon reflexes present and symmetric  GAIT/STATION:   narrow based gait    DIAGNOSTIC DATA (LABS, IMAGING, TESTING) - I reviewed patient records, labs, notes,  testing and imaging myself where available.  Lab Results  Component Value Date   WBC 10.6 (H) 06/19/2017   HGB 10.5 (L) 06/19/2017   HCT 31.7 (L) 06/19/2017   MCV 93.8 06/19/2017   PLT 178 06/19/2017      Component Value Date/Time   NA 143 09/23/2017 1147   K 4.3  09/23/2017 1147   CL 105 09/23/2017 1147   CO2 22 09/23/2017 1147   GLUCOSE 87 09/23/2017 1147   GLUCOSE 107 (H) 06/19/2017 0548   BUN 15 09/23/2017 1147   CREATININE 0.97 09/23/2017 1147   CREATININE 0.89 09/14/2015 1616   CALCIUM 9.5 09/23/2017 1147   PROT 6.7 09/23/2017 1147   ALBUMIN 4.4 09/23/2017 1147   AST 16 09/23/2017 1147   ALT 16 09/23/2017 1147   ALKPHOS 80 09/23/2017 1147   BILITOT 0.4 09/23/2017 1147   GFRNONAA 61 09/23/2017 1147   GFRAA 70 09/23/2017 1147   Lab Results  Component Value Date   CHOL 186 09/23/2017   HDL 69 09/23/2017   LDLCALC 59 09/23/2017   TRIG 290 (H) 09/23/2017   CHOLHDL 2.7 09/23/2017   Lab Results  Component Value Date   HGBA1C 5.7 05/31/2009   No results found for: VITAMINB12 Lab Results  Component Value Date   TSH 2.090 09/23/2017    12/14/17 MRI brain and orbits [I reviewed images myself and agree with interpretation. -VRP]  - Normal MRI of the brain and normal MRI of the orbits. No abnormality seen to explain the presenting symptoms.  12/06/17 achR (binding, blocking, modulating) --> negative    ASSESSMENT AND PLAN  67 y.o. year old female here with intermittent, transient double vision, since September 2018.  Neurologic examination and neuroimaging unremarkable.  Ddx: neuromuscular junction disorder vs cranial neuropathy  1. Double vision      PLAN:  - I have requested, received and reviewed notes from Dr. Valetta Close - check anti-MUSK myasthenia gravis lab testing - monitor symptoms  Return if symptoms worsen or fail to improve, for monitor symptosm.    Penni Bombard, MD 5/36/6440, 34:74 AM Certified in Neurology, Neurophysiology and Neuroimaging  [facility] [address], [place], [place] [address] [phone]

## 2018-01-22 DIAGNOSIS — M961 Postlaminectomy syndrome, not elsewhere classified: Secondary | ICD-10-CM | POA: Diagnosis not present

## 2018-01-22 DIAGNOSIS — L905 Scar conditions and fibrosis of skin: Secondary | ICD-10-CM | POA: Diagnosis not present

## 2018-01-22 DIAGNOSIS — M5030 Other cervical disc degeneration, unspecified cervical region: Secondary | ICD-10-CM | POA: Diagnosis not present

## 2018-01-22 DIAGNOSIS — C44729 Squamous cell carcinoma of skin of left lower limb, including hip: Secondary | ICD-10-CM | POA: Diagnosis not present

## 2018-01-29 DIAGNOSIS — E119 Type 2 diabetes mellitus without complications: Secondary | ICD-10-CM | POA: Diagnosis not present

## 2018-01-29 DIAGNOSIS — I10 Essential (primary) hypertension: Secondary | ICD-10-CM | POA: Diagnosis not present

## 2018-01-29 DIAGNOSIS — E039 Hypothyroidism, unspecified: Secondary | ICD-10-CM | POA: Diagnosis not present

## 2018-01-30 NOTE — Pre-Procedure Instructions (Signed)
[name]  01/30/2018      [id number] [place], [place] [address] [address] Phone: [phone] Fax: [phone]  [facility] [place], [place] [address] Phone: [phone] Fax: [phone]  [facility] [place], [place] [address] [place] [address] Phone: [phone] Fax: [place], [place] [address] Phone: [phone] Fax: [phone]    Your procedure is scheduled on February 14, 2018.  Report to [facility] Admitting at 600 AM.  Call this number if you have problems the morning of surgery:  [phone]   Remember:  No food or drink after midnight.   Continue all medications as directed by your physician except follow these medication instructions before surgery below   Take these medicines the morning of surgery with A SIP OF WATER  Tylenol Bupropion (wellbutrin) Hydrocodone-acetaminophen (norco)-if needed for pain Loratadine (claritin) Methocarbamol (robaxin) Simethicone (phazyme)  7 days prior to surgery STOP taking any diclofenac (voltaren) gel, Aspirin (unless otherwise instructed by your surgeon), Aleve, Naproxen, Ibuprofen, Motrin, Advil, Goody's, BC's, all herbal medications, fish oil, and all vitamins   Do not wear jewelry, make-up or nail polish.  Do not wear lotions, powders, or perfumes, or deodorant.  Do not shave 48 hours prior to surgery.    Do not bring valuables to the hospital.  [facility] is not responsible for any belongings or valuables.  Contacts, dentures or bridgework may not be worn into surgery.  Leave your suitcase in the car.  After surgery it may be brought to your room.  For patients admitted to the hospital, discharge time will be determined by  your treatment team.  Patients discharged the day of surgery will not be allowed to drive home.    [facility]- Preparing For Surgery  Before surgery, you can play an important role. Because skin is not sterile, your skin needs to be as free of germs as possible. You can reduce the number of germs on your skin by washing with CHG (chlorahexidine gluconate) Soap before surgery.  CHG is an antiseptic cleaner which kills germs and bonds with the skin to continue killing germs even after washing.    Oral Hygiene is also important to reduce your risk of infection.  Remember - BRUSH YOUR TEETH THE MORNING OF SURGERY WITH YOUR REGULAR TOOTHPASTE  Please do not use if you have an allergy to CHG or antibacterial soaps. If your skin becomes reddened/irritated stop using the CHG.  Do not shave (including legs and underarms) for at least 48 hours prior to first CHG shower. It is OK to shave your face.  Please follow these instructions carefully.   1. Shower the NIGHT BEFORE SURGERY and the MORNING OF SURGERY with CHG.   2. If you chose to wash your hair, wash your hair first as usual with your normal shampoo.  3. After you shampoo, rinse your hair and body thoroughly to remove the shampoo.  4. Use CHG as you would any other liquid soap. You can apply CHG directly to the skin and wash gently with a scrungie or a clean washcloth.   5. Apply the CHG Soap to your body ONLY FROM THE NECK DOWN.  Do not use on  open wounds or open sores. Avoid contact with your eyes, ears, mouth and genitals (private parts). Wash Face and genitals (private parts)  with your normal soap.  6. Wash thoroughly, paying special attention to the area where your surgery will be performed.  7. Thoroughly rinse your body with warm water from the neck down.  8. DO NOT shower/wash with your normal soap after using and rinsing off the CHG Soap.  9. Pat yourself dry with a CLEAN TOWEL.  10. Wear CLEAN PAJAMAS to bed the night before  surgery, wear comfortable clothes the morning of surgery  11. Place CLEAN SHEETS on your bed the night of your first shower and DO NOT SLEEP WITH PETS.  Day of Surgery:  Do not apply any deodorants/lotions.  Please wear clean clothes to the hospital/surgery center.   Remember to brush your teeth WITH YOUR REGULAR TOOTHPASTE.   Please read over the following fact sheets that you were given. Pain Booklet, Coughing and Deep Breathing, MRSA Information and Surgical Site Infection Prevention

## 2018-01-30 NOTE — Progress Notes (Addendum)
PCP: [name], MD  Cardiologist: pt denies  EKG: 06/10/17 in EPIC  Stress test: pt denies  ECHO: pt denies  Cardiac Cath: pt denies  Chest x-ray: pt denies past year, no recent respiratory complications/infections

## 2018-01-31 ENCOUNTER — Encounter (HOSPITAL_COMMUNITY): Payer: Self-pay

## 2018-01-31 ENCOUNTER — Encounter (HOSPITAL_COMMUNITY)
Admission: RE | Admit: 2018-01-31 | Discharge: 2018-01-31 | Disposition: A | Discharge status: Home / Self Care | Payer: Medicare Other | Source: Ambulatory Visit | Attending: Neurological Surgery | Admitting: Neurological Surgery

## 2018-01-31 ENCOUNTER — Other Ambulatory Visit: Payer: Self-pay

## 2018-01-31 ENCOUNTER — Ambulatory Visit (HOSPITAL_COMMUNITY)
Admission: RE | Admit: 2018-01-31 | Discharge: 2018-01-31 | Disposition: A | Discharge status: Home / Self Care | Payer: Medicare Other | Source: Ambulatory Visit | Attending: Neurological Surgery | Admitting: Neurological Surgery

## 2018-01-31 DIAGNOSIS — M542 Cervicalgia: Secondary | ICD-10-CM

## 2018-01-31 DIAGNOSIS — R0989 Other specified symptoms and signs involving the circulatory and respiratory systems: Secondary | ICD-10-CM | POA: Diagnosis not present

## 2018-01-31 LAB — SURGICAL PCR SCREEN
MRSA, PCR: NEGATIVE
STAPHYLOCOCCUS AUREUS: NEGATIVE

## 2018-01-31 LAB — CBC WITH DIFFERENTIAL/PLATELET
Abs Immature Granulocytes: 0 10*3/uL (ref 0.0–0.1)
BASOS ABS: 0.1 10*3/uL (ref 0.0–0.1)
Basophils Relative: 1 %
EOS ABS: 0.2 10*3/uL (ref 0.0–0.7)
EOS PCT: 3 %
HCT: 48.5 % — ABNORMAL HIGH (ref 36.0–46.0)
Hemoglobin: 15.8 g/dL — ABNORMAL HIGH (ref 12.0–15.0)
Immature Granulocytes: 0 %
LYMPHS PCT: 32 %
Lymphs Abs: 2.3 10*3/uL (ref 0.7–4.0)
MCH: 30 pg (ref 26.0–34.0)
MCHC: 32.6 g/dL (ref 30.0–36.0)
MCV: 92.2 fL (ref 78.0–100.0)
Monocytes Absolute: 0.6 10*3/uL (ref 0.1–1.0)
Monocytes Relative: 9 %
Neutro Abs: 3.8 10*3/uL (ref 1.7–7.7)
Neutrophils Relative %: 55 %
Platelets: 275 10*3/uL (ref 150–400)
RBC: 5.26 MIL/uL — AB (ref 3.87–5.11)
RDW: 12.8 % (ref 11.5–15.5)
WBC: 7.1 10*3/uL (ref 4.0–10.5)

## 2018-01-31 LAB — ABO/RH: ABO/RH(D): A POS

## 2018-01-31 LAB — BASIC METABOLIC PANEL
Anion gap: 12 (ref 5–15)
BUN: 13 mg/dL (ref 6–20)
CO2: 26 mmol/L (ref 22–32)
CREATININE: 0.89 mg/dL (ref 0.44–1.00)
Calcium: 9.8 mg/dL (ref 8.9–10.3)
Chloride: 102 mmol/L (ref 101–111)
GFR calc non Af Amer: >60 mL/min (ref >60)
Glucose, Bld: 110 mg/dL — ABNORMAL HIGH (ref 65–99)
POTASSIUM: 3.5 mmol/L (ref 3.5–5.1)
Sodium: 140 mmol/L (ref 135–145)

## 2018-01-31 LAB — TYPE AND SCREEN
ABO/RH(D): A POS
Antibody Screen: NEGATIVE

## 2018-01-31 LAB — MUSK ANTIBODIES: MuSK Antibodies: <1 U/mL

## 2018-01-31 LAB — PROTIME-INR
INR: 0.98
PROTHROMBIN TIME: 12.9 s (ref 11.4–15.2)

## 2018-01-31 MED ORDER — CHLORHEXIDINE GLUCONATE CLOTH 2 % EX PADS: 6.0000 | MEDICATED_PAD | Freq: Once | CUTANEOUS | Status: DC

## 2018-02-04 ENCOUNTER — Telehealth: Payer: Self-pay | Admitting: *Deleted

## 2018-02-04 NOTE — Telephone Encounter (Signed)
-----   Message from Penni Bombard, MD sent at 01/31/2018  1:43 PM EDT ----- Normal labs. Please call patient. -VRP

## 2018-02-04 NOTE — Telephone Encounter (Signed)
Spoke to pt and relayed that the lab results were normal.  She verbalized understanding.

## 2018-02-06 IMAGING — US US THYROID
1 series · 13 of 25 positions shown · non-contrast
Comparison: None.

CLINICAL DATA: 66-year-old female with a history of familial
hypothyroidism

EXAM:
THYROID ULTRASOUND
TECHNIQUE: Ultrasound examination of the thyroid gland and adjacent soft
tissues was performed.

[Series 1: us thyroid · 0.04mm/px · 13 of 54 slices shown]
[im 1/54]
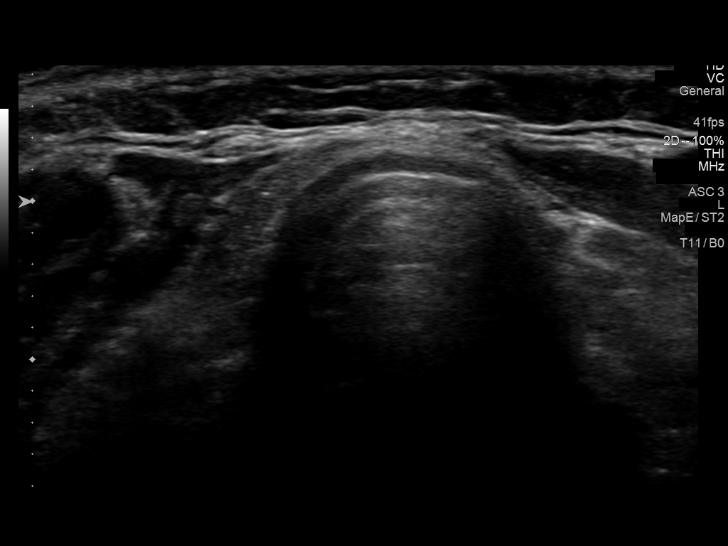
[im 5/54]
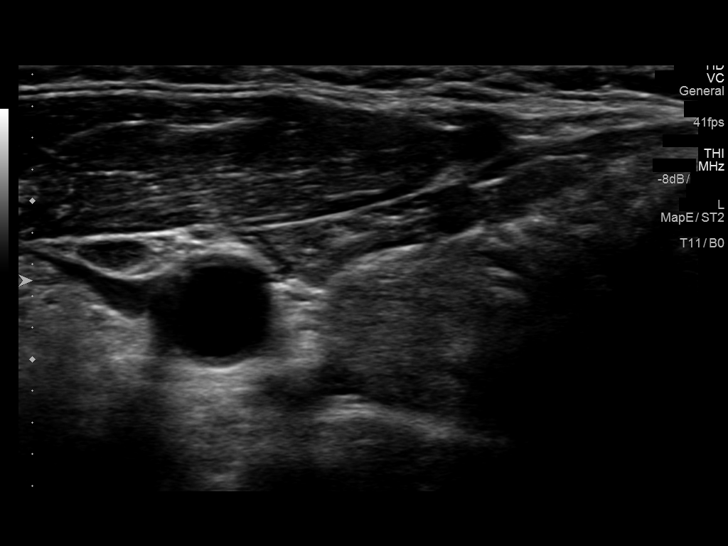
[im 9/54]
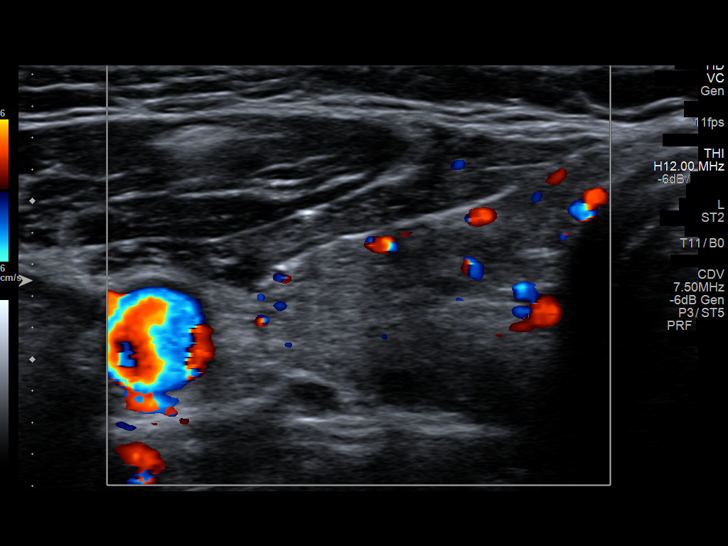
[im 14/54]
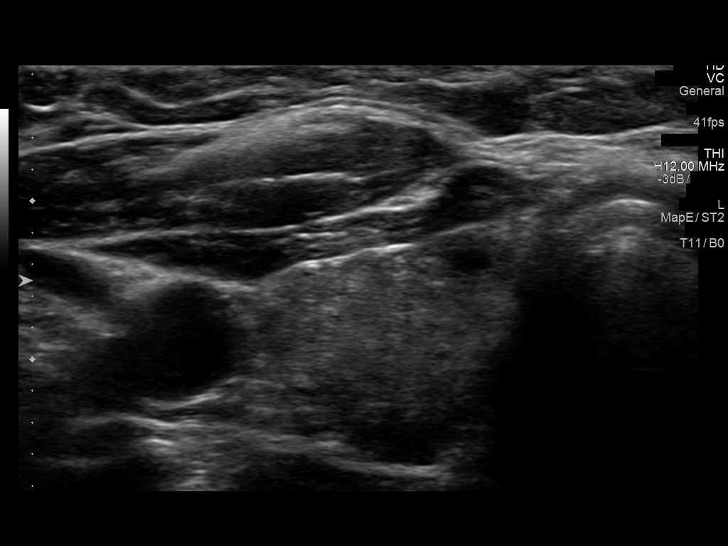
[im 18/54]
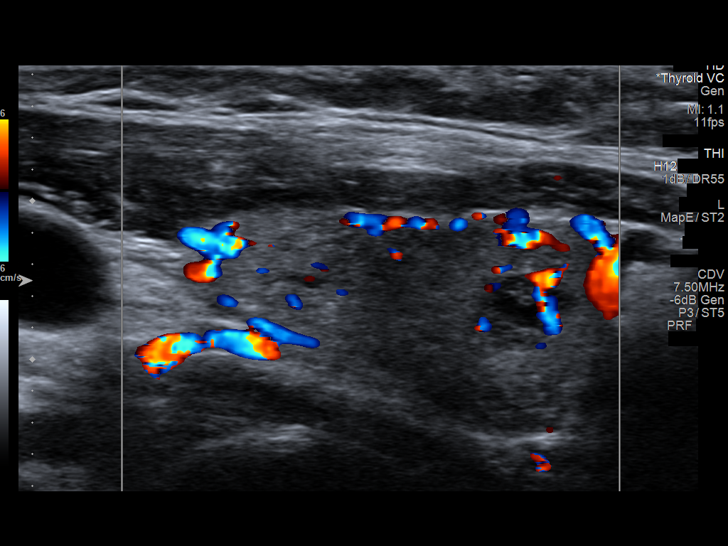
[im 23/54]
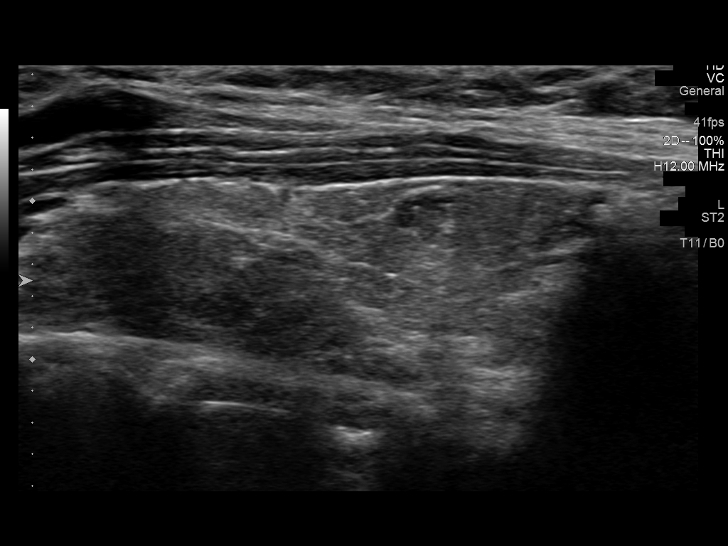
[im 27/54]
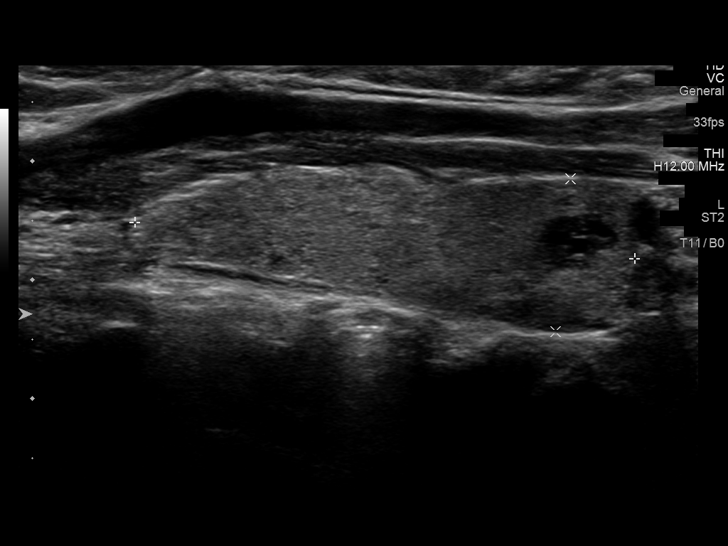
[im 31/54]
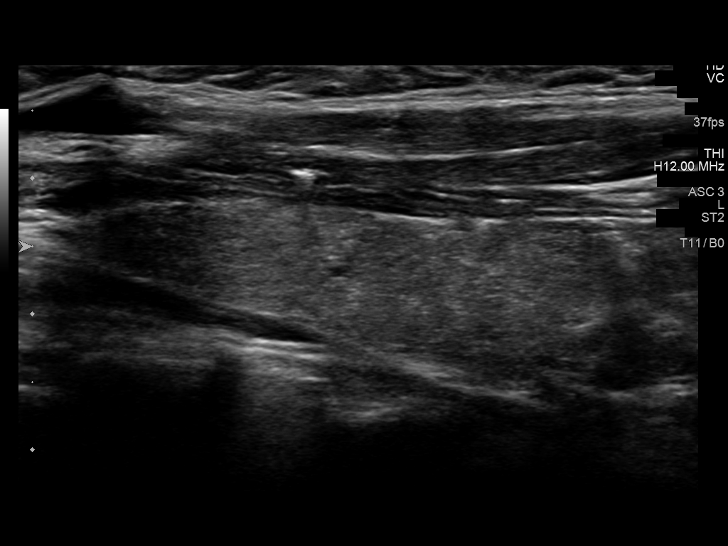
[im 36/54]
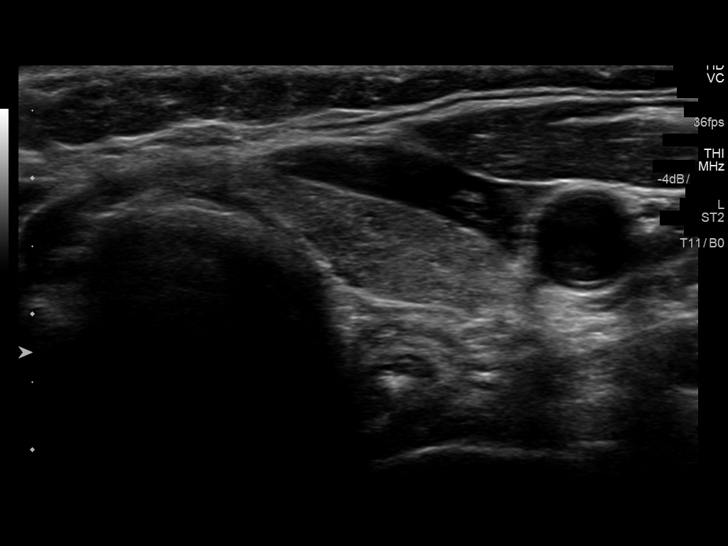
[im 40/54]
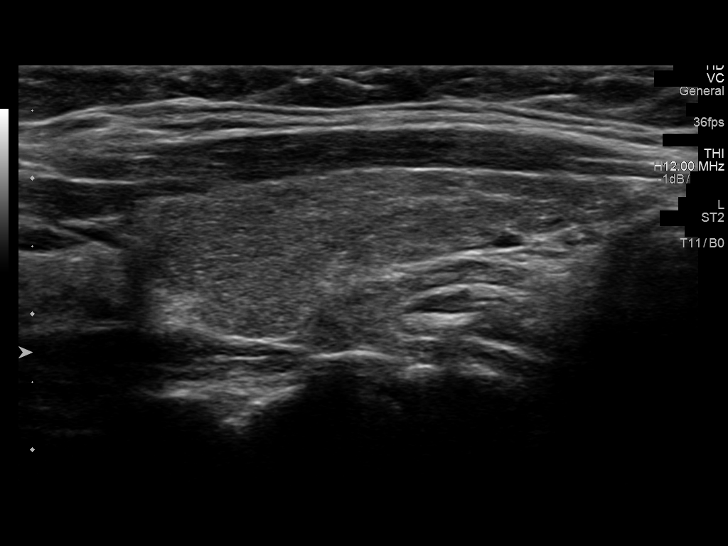
[im 45/54]
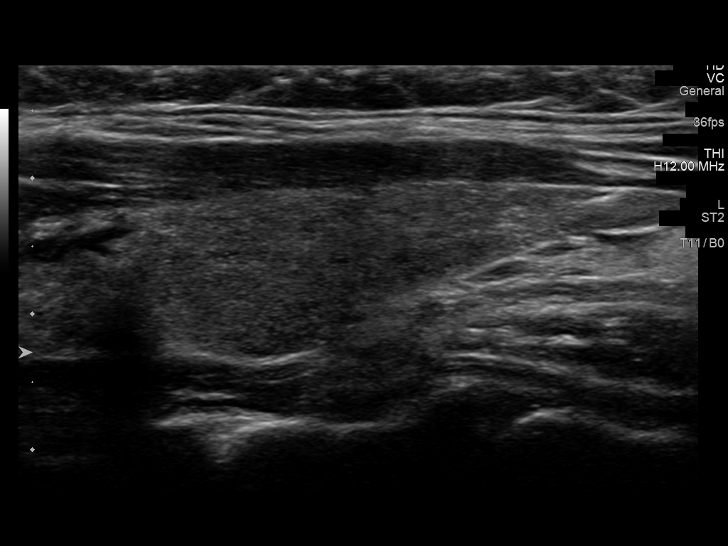
[im 49/54]
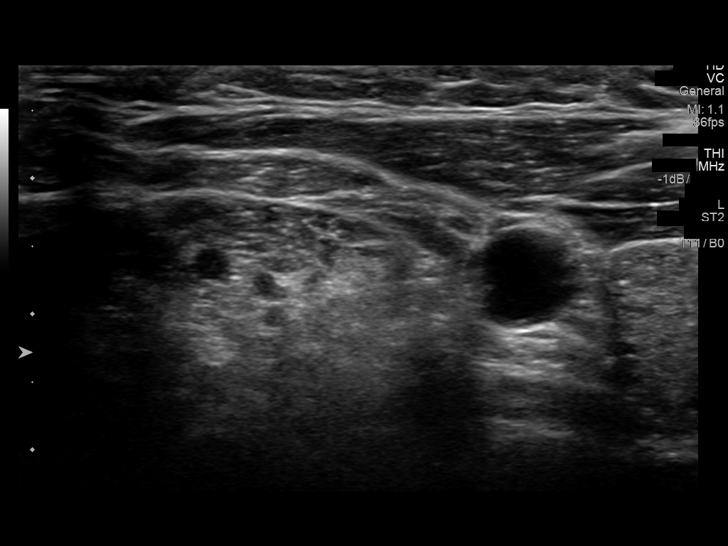
[im 54/54]
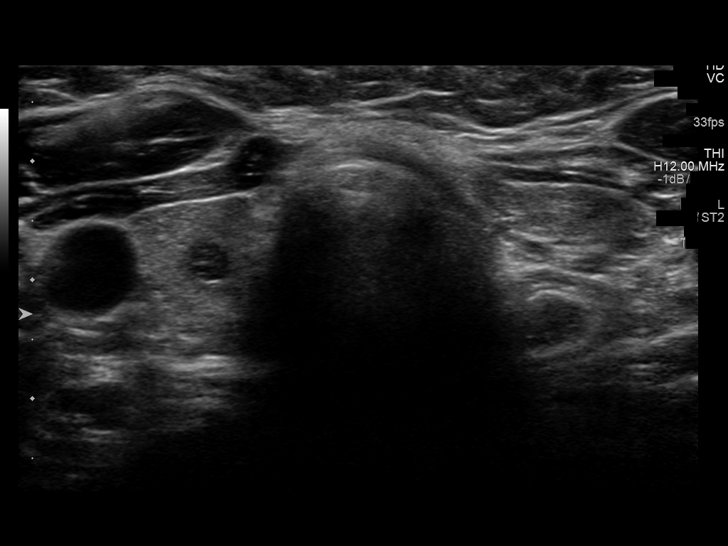

[13 of 25 positions shown; findings below may reference images not displayed]

FINDINGS: Parenchymal Echotexture: Mildly heterogenous

Isthmus: 0.1 cm

Right lobe: 4.2 cm x 1.3 cm x 1.9 cm

Left lobe: 4.3 cm x 1.2 cm x 1.4 cm

_________________________________________________________

Estimated total number of nodules >/= 1 cm: 0

Number of spongiform nodules >/=  2 cm not described below (TR1): 0

Number of mixed cystic and solid nodules >/= 1.5 cm not described
below (TR2): 0

_________________________________________________________

Nodule # 1:

Location: Right; Inferior

Maximum size: 0.6 cm; Other 2 dimensions: 0.5 cm x 0.5 cm

Composition: cannot determine (2)

Echogenicity: hypoechoic (2)

Shape: not taller-than-wide (0)

Margins: ill-defined (0)

Echogenic foci: none (0)

ACR TI-RADS total points: 4.

ACR TI-RADS risk category: TR4 (4-6 points).

ACR TI-RADS recommendations:

Nodule does not meet criteria for surveillance or biopsy

_________________________________________________________

No adenopathy
IMPRESSION: Heterogeneous thyroid may indicate medical thyroid disease.

No thyroid nodule meets criteria for biopsy or surveillance, as
designated by the newly established ACR TI-RADS criteria.

Recommendations follow those established by the new ACR TI-RADS
criteria ([HOSPITAL] 0284;[DATE]).

## 2018-02-13 MED ORDER — CEFAZOLIN SODIUM-DEXTROSE 2-4 GM/100ML-% IV SOLN
2.0000 g | INTRAVENOUS | Status: AC
  Administered 2018-02-14: 2 g via INTRAVENOUS
  Filled 2018-02-13: qty 100

## 2018-02-13 MED ORDER — DEXAMETHASONE SODIUM PHOSPHATE 10 MG/ML IJ SOLN
10.0000 mg | INTRAMUSCULAR | Status: DC
  Filled 2018-02-13: qty 1

## 2018-02-13 NOTE — Anesthesia Preprocedure Evaluation (Signed)
Anesthesia Evaluation  Patient identified by MRN, date of birth, ID band Patient awake    Reviewed: Allergy & Precautions, NPO status , Patient's Chart, lab work & pertinent test results  Airway Mallampati: II  TM Distance: >3 FB Neck ROM: Full    Dental no notable dental hx.    Pulmonary neg pulmonary ROS,    Pulmonary exam normal breath sounds clear to auscultation       Cardiovascular Exercise Tolerance: Good hypertension, Pt. on medications Normal cardiovascular exam Rhythm:Regular Rate:Normal     Neuro/Psych  Headaches, PSYCHIATRIC DISORDERS Depression    GI/Hepatic Neg liver ROS, GERD  ,  Endo/Other  negative endocrine ROS  Renal/GU negative Renal ROS  negative genitourinary   Musculoskeletal  (+) Arthritis ,   Abdominal   Peds negative pediatric ROS (+)  Hematology negative hematology ROS (+)   Anesthesia Other Findings   Reproductive/Obstetrics negative OB ROS                             Anesthesia Physical  Anesthesia Plan  ASA: II  Anesthesia Plan: General   Post-op Pain Management:    Induction: Intravenous  PONV Risk Score and Plan: 2 and Ondansetron and Midazolam  Airway Management Planned: Oral ETT  Additional Equipment:   Intra-op Plan:   Post-operative Plan: Extubation in OR  Informed Consent: I have reviewed the patients History and Physical, chart, labs and discussed the procedure including the risks, benefits and alternatives for the proposed anesthesia with the patient or authorized representative who has indicated his/her understanding and acceptance.   Dental advisory given  Plan Discussed with: CRNA, Anesthesiologist and Surgeon  Anesthesia Plan Comments: (  )                                         Anesthesia Evaluation  Patient identified by MRN, date of birth, ID band Patient awake    Reviewed: Allergy & Precautions, NPO status ,  Patient's Chart, lab work & pertinent test results  Airway Mallampati: II  TM Distance: >3 FB Neck ROM: Full    Dental no notable dental hx.    Pulmonary neg pulmonary ROS,    Pulmonary exam normal breath sounds clear to auscultation       Cardiovascular Exercise Tolerance: Good hypertension, Pt. on medications Normal cardiovascular exam Rhythm:Regular Rate:Normal     Neuro/Psych  Headaches, PSYCHIATRIC DISORDERS Depression    GI/Hepatic Neg liver ROS, GERD  ,  Endo/Other  negative endocrine ROS  Renal/GU negative Renal ROS  negative genitourinary   Musculoskeletal  (+) Arthritis ,   Abdominal   Peds negative pediatric ROS (+)  Hematology negative hematology ROS (+)   Anesthesia Other Findings   Reproductive/Obstetrics negative OB ROS                             Anesthesia Physical Anesthesia Plan  ASA: II  Anesthesia Plan: General   Post-op Pain Management:    Induction: Intravenous  Airway Management Planned: Oral ETT  Additional Equipment:   Intra-op Plan:   Post-operative Plan: Extubation in OR  Informed Consent: I have reviewed the patients History and Physical, chart, labs and discussed the procedure including the risks, benefits and alternatives for the proposed anesthesia with the patient or authorized representative who has  indicated his/her understanding and acceptance.   Dental advisory given  Plan Discussed with: CRNA  Anesthesia Plan Comments:         Anesthesia Quick Evaluation  Anesthesia Quick Evaluation

## 2018-02-14 ENCOUNTER — Inpatient Hospital Stay (HOSPITAL_COMMUNITY): Payer: Medicare Other | Admitting: Vascular Surgery

## 2018-02-14 ENCOUNTER — Encounter (HOSPITAL_COMMUNITY): Payer: Self-pay | Admitting: *Deleted

## 2018-02-14 ENCOUNTER — Inpatient Hospital Stay (HOSPITAL_COMMUNITY): Payer: Medicare Other

## 2018-02-14 ENCOUNTER — Observation Stay (HOSPITAL_COMMUNITY)
Admission: RE | Admit: 2018-02-14 | Discharge: 2018-02-15 | Disposition: A | Discharge status: Home / Self Care | Payer: Medicare Other | Source: Ambulatory Visit | Attending: Neurological Surgery | Admitting: Neurological Surgery

## 2018-02-14 ENCOUNTER — Encounter (HOSPITAL_COMMUNITY)
Admission: RE | Disposition: A | Discharge status: Home / Self Care | Payer: Self-pay | Source: Ambulatory Visit | Attending: Neurological Surgery

## 2018-02-14 DIAGNOSIS — F329 Major depressive disorder, single episode, unspecified: Secondary | ICD-10-CM | POA: Diagnosis not present

## 2018-02-14 DIAGNOSIS — Z79899 Other long term (current) drug therapy: Secondary | ICD-10-CM | POA: Diagnosis not present

## 2018-02-14 DIAGNOSIS — M542 Cervicalgia: Secondary | ICD-10-CM | POA: Diagnosis not present

## 2018-02-14 DIAGNOSIS — M47812 Spondylosis without myelopathy or radiculopathy, cervical region: Secondary | ICD-10-CM | POA: Diagnosis not present

## 2018-02-14 DIAGNOSIS — M4722 Other spondylosis with radiculopathy, cervical region: Principal | ICD-10-CM | POA: Insufficient documentation

## 2018-02-14 DIAGNOSIS — M5412 Radiculopathy, cervical region: Secondary | ICD-10-CM | POA: Diagnosis present

## 2018-02-14 DIAGNOSIS — Z981 Arthrodesis status: Secondary | ICD-10-CM | POA: Diagnosis not present

## 2018-02-14 DIAGNOSIS — Z886 Allergy status to analgesic agent status: Secondary | ICD-10-CM | POA: Insufficient documentation

## 2018-02-14 DIAGNOSIS — Z85828 Personal history of other malignant neoplasm of skin: Secondary | ICD-10-CM | POA: Diagnosis not present

## 2018-02-14 DIAGNOSIS — I10 Essential (primary) hypertension: Secondary | ICD-10-CM | POA: Diagnosis not present

## 2018-02-14 DIAGNOSIS — E785 Hyperlipidemia, unspecified: Secondary | ICD-10-CM | POA: Diagnosis not present

## 2018-02-14 DIAGNOSIS — M4322 Fusion of spine, cervical region: Secondary | ICD-10-CM | POA: Diagnosis present

## 2018-02-14 DIAGNOSIS — K219 Gastro-esophageal reflux disease without esophagitis: Secondary | ICD-10-CM | POA: Diagnosis not present

## 2018-02-14 DIAGNOSIS — Z419 Encounter for procedure for purposes other than remedying health state, unspecified: Secondary | ICD-10-CM

## 2018-02-14 HISTORY — PX: ANTERIOR CERVICAL DECOMP/DISCECTOMY FUSION: SHX1161

## 2018-02-14 SURGERY — ANTERIOR CERVICAL DECOMPRESSION/DISCECTOMY FUSION 2 LEVEL/HARDWARE REMOVAL
Anesthesia: General | Site: Spine Cervical

## 2018-02-14 MED ORDER — ACETAMINOPHEN 325 MG PO TABS
650.0000 mg | ORAL_TABLET | ORAL | Status: DC | PRN
Start: 2018-02-14 — End: 2018-02-15

## 2018-02-14 MED ORDER — 0.9 % SODIUM CHLORIDE (POUR BTL) OPTIME
TOPICAL | Status: DC | PRN
  Administered 2018-02-14: 1000 mL

## 2018-02-14 MED ORDER — SODIUM CHLORIDE 0.9 % IV SOLN: 250.0000 mL | INTRAVENOUS | Status: DC

## 2018-02-14 MED ORDER — MEPERIDINE HCL 50 MG/ML IJ SOLN: 6.2500 mg | INTRAMUSCULAR | Status: DC | PRN

## 2018-02-14 MED ORDER — DEXAMETHASONE SODIUM PHOSPHATE 4 MG/ML IJ SOLN
INTRAMUSCULAR | Status: DC | PRN
  Administered 2018-02-14: 10 mg via INTRAVENOUS

## 2018-02-14 MED ORDER — PHENYLEPHRINE HCL 10 MG/ML IJ SOLN
INTRAMUSCULAR | Status: DC | PRN
  Administered 2018-02-14 (×2): 120 ug via INTRAVENOUS

## 2018-02-14 MED ORDER — PHENOL 1.4 % MT LIQD: 1.0000 | OROMUCOSAL | Status: DC | PRN

## 2018-02-14 MED ORDER — FENTANYL CITRATE (PF) 250 MCG/5ML IJ SOLN
INTRAMUSCULAR | Status: AC
  Filled 2018-02-14: qty 5

## 2018-02-14 MED ORDER — ACETAMINOPHEN 325 MG PO TABS: 325.0000 mg | ORAL_TABLET | ORAL | Status: DC | PRN

## 2018-02-14 MED ORDER — FENTANYL CITRATE (PF) 100 MCG/2ML IJ SOLN
INTRAMUSCULAR | Status: AC
  Administered 2018-02-14: 50 ug via INTRAVENOUS
  Filled 2018-02-14: qty 2

## 2018-02-14 MED ORDER — MIDAZOLAM HCL 2 MG/2ML IJ SOLN
INTRAMUSCULAR | Status: AC
  Filled 2018-02-14: qty 2

## 2018-02-14 MED ORDER — SODIUM CHLORIDE 0.9% FLUSH: 3.0000 mL | INTRAVENOUS | Status: DC | PRN

## 2018-02-14 MED ORDER — HYDROMORPHONE HCL 1 MG/ML IJ SOLN
0.5000 mg | INTRAMUSCULAR | Status: DC | PRN
  Administered 2018-02-14 (×3): 0.5 mg via INTRAVENOUS
  Filled 2018-02-14 (×3): qty 0.5

## 2018-02-14 MED ORDER — METHOCARBAMOL 1000 MG/10ML IJ SOLN
500.0000 mg | Freq: Four times a day (QID) | INTRAVENOUS | Status: DC | PRN
  Filled 2018-02-14: qty 5

## 2018-02-14 MED ORDER — CYCLOBENZAPRINE HCL 10 MG PO TABS
10.0000 mg | ORAL_TABLET | Freq: Three times a day (TID) | ORAL | Status: DC | PRN
  Administered 2018-02-14 – 2018-02-15 (×3): 10 mg via ORAL
  Filled 2018-02-14 (×3): qty 1

## 2018-02-14 MED ORDER — ONDANSETRON HCL 4 MG PO TABS: 4.0000 mg | ORAL_TABLET | Freq: Four times a day (QID) | ORAL | Status: DC | PRN

## 2018-02-14 MED ORDER — THROMBIN 5000 UNITS EX SOLR
CUTANEOUS | Status: AC
  Filled 2018-02-14: qty 5000

## 2018-02-14 MED ORDER — ONDANSETRON HCL 4 MG/2ML IJ SOLN
INTRAMUSCULAR | Status: DC | PRN
  Administered 2018-02-14: 4 mg via INTRAVENOUS

## 2018-02-14 MED ORDER — MIDAZOLAM HCL 5 MG/5ML IJ SOLN
INTRAMUSCULAR | Status: DC | PRN
  Administered 2018-02-14: 2 mg via INTRAVENOUS

## 2018-02-14 MED ORDER — POTASSIUM CHLORIDE IN NACL 20-0.9 MEQ/L-% IV SOLN: INTRAVENOUS | Status: DC

## 2018-02-14 MED ORDER — ONDANSETRON HCL 4 MG/2ML IJ SOLN: 4.0000 mg | Freq: Four times a day (QID) | INTRAMUSCULAR | Status: DC | PRN

## 2018-02-14 MED ORDER — NIACINAMIDE 500 MG PO TABS: 500.0000 mg | ORAL_TABLET | Freq: Two times a day (BID) | ORAL | Status: DC

## 2018-02-14 MED ORDER — FENTANYL CITRATE (PF) 100 MCG/2ML IJ SOLN
25.0000 ug | INTRAMUSCULAR | Status: DC | PRN
  Administered 2018-02-14 (×2): 25 ug via INTRAVENOUS
  Administered 2018-02-14: 50 ug via INTRAVENOUS

## 2018-02-14 MED ORDER — LACTATED RINGERS IV SOLN
INTRAVENOUS | Status: DC | PRN
  Administered 2018-02-14 (×2): via INTRAVENOUS

## 2018-02-14 MED ORDER — OXYCODONE HCL 5 MG PO TABS
5.0000 mg | ORAL_TABLET | Freq: Once | ORAL | Status: AC | PRN
  Administered 2018-02-14: 5 mg via ORAL

## 2018-02-14 MED ORDER — SODIUM CHLORIDE 0.9% FLUSH: 3.0000 mL | Freq: Two times a day (BID) | INTRAVENOUS | Status: DC

## 2018-02-14 MED ORDER — BUPIVACAINE HCL (PF) 0.25 % IJ SOLN
INTRAMUSCULAR | Status: AC
  Filled 2018-02-14: qty 30

## 2018-02-14 MED ORDER — MENTHOL 3 MG MT LOZG: 1.0000 | LOZENGE | OROMUCOSAL | Status: DC | PRN

## 2018-02-14 MED ORDER — FUROSEMIDE 40 MG PO TABS: 40.0000 mg | ORAL_TABLET | Freq: Every day | ORAL | Status: DC

## 2018-02-14 MED ORDER — ONDANSETRON HCL 4 MG/2ML IJ SOLN: 4.0000 mg | Freq: Once | INTRAMUSCULAR | Status: DC | PRN

## 2018-02-14 MED ORDER — SODIUM CHLORIDE 0.9 % IV SOLN
INTRAVENOUS | Status: DC | PRN
  Administered 2018-02-14: 500 mL

## 2018-02-14 MED ORDER — CHLORHEXIDINE GLUCONATE CLOTH 2 % EX PADS: 6.0000 | MEDICATED_PAD | Freq: Once | CUTANEOUS | Status: DC

## 2018-02-14 MED ORDER — OXYCODONE HCL 5 MG/5ML PO SOLN: 5.0000 mg | Freq: Once | ORAL | Status: AC | PRN

## 2018-02-14 MED ORDER — ACETAMINOPHEN 650 MG RE SUPP: 650.0000 mg | RECTAL | Status: DC | PRN

## 2018-02-14 MED ORDER — BUPROPION HCL ER (XL) 300 MG PO TB24: 300.0000 mg | ORAL_TABLET | Freq: Every day | ORAL | Status: DC

## 2018-02-14 MED ORDER — CEFAZOLIN SODIUM-DEXTROSE 2-4 GM/100ML-% IV SOLN
2.0000 g | Freq: Three times a day (TID) | INTRAVENOUS | Status: AC
  Administered 2018-02-14 (×2): 2 g via INTRAVENOUS
  Filled 2018-02-14 (×2): qty 100

## 2018-02-14 MED ORDER — METHOCARBAMOL 500 MG PO TABS
500.0000 mg | ORAL_TABLET | Freq: Four times a day (QID) | ORAL | Status: DC | PRN
  Administered 2018-02-14: 500 mg via ORAL

## 2018-02-14 MED ORDER — HYDROCODONE-ACETAMINOPHEN 10-325 MG PO TABS
1.0000 | ORAL_TABLET | ORAL | Status: DC | PRN
  Administered 2018-02-14 – 2018-02-15 (×6): 1 via ORAL
  Filled 2018-02-14 (×6): qty 1

## 2018-02-14 MED ORDER — ACETAMINOPHEN 160 MG/5ML PO SOLN: 325.0000 mg | ORAL | Status: DC | PRN

## 2018-02-14 MED ORDER — SUGAMMADEX SODIUM 200 MG/2ML IV SOLN
INTRAVENOUS | Status: DC | PRN
  Administered 2018-02-14: 164.6 mg via INTRAVENOUS

## 2018-02-14 MED ORDER — LIDOCAINE HCL (CARDIAC) PF 100 MG/5ML IV SOSY
PREFILLED_SYRINGE | INTRAVENOUS | Status: DC | PRN
  Administered 2018-02-14: 40 mg via INTRAVENOUS

## 2018-02-14 MED ORDER — PROPOFOL 10 MG/ML IV BOLUS
INTRAVENOUS | Status: AC
  Filled 2018-02-14: qty 20

## 2018-02-14 MED ORDER — SENNA 8.6 MG PO TABS
1.0000 | ORAL_TABLET | Freq: Two times a day (BID) | ORAL | Status: DC
  Filled 2018-02-14 (×2): qty 1

## 2018-02-14 MED ORDER — PROPOFOL 10 MG/ML IV BOLUS
INTRAVENOUS | Status: DC | PRN
  Administered 2018-02-14: 150 mg via INTRAVENOUS

## 2018-02-14 MED ORDER — FENTANYL CITRATE (PF) 250 MCG/5ML IJ SOLN
INTRAMUSCULAR | Status: DC | PRN
  Administered 2018-02-14 (×2): 50 ug via INTRAVENOUS

## 2018-02-14 MED ORDER — METHOCARBAMOL 500 MG PO TABS
ORAL_TABLET | ORAL | Status: AC
  Administered 2018-02-14: 500 mg via ORAL
  Filled 2018-02-14: qty 1

## 2018-02-14 MED ORDER — DEXTROSE 5 % IV SOLN
INTRAVENOUS | Status: DC | PRN
  Administered 2018-02-14: 35 ug/min via INTRAVENOUS

## 2018-02-14 MED ORDER — ROCURONIUM BROMIDE 100 MG/10ML IV SOLN
INTRAVENOUS | Status: DC | PRN
  Administered 2018-02-14: 50 mg via INTRAVENOUS

## 2018-02-14 MED ORDER — THROMBIN 5000 UNITS EX SOLR
OROMUCOSAL | Status: DC | PRN
  Administered 2018-02-14: 5 mL via TOPICAL

## 2018-02-14 MED ORDER — OXYCODONE HCL 5 MG PO TABS
ORAL_TABLET | ORAL | Status: AC
  Administered 2018-02-14: 5 mg via ORAL
  Filled 2018-02-14: qty 1

## 2018-02-14 SURGICAL SUPPLY — 55 items
BAG DECANTER FOR FLEXI CONT (MISCELLANEOUS) ×2 IMPLANT
BASKET BONE COLLECTION (BASKET) ×2 IMPLANT
BENZOIN TINCTURE PRP APPL 2/3 (GAUZE/BANDAGES/DRESSINGS) ×2 IMPLANT
BIT DRILL 13 (BIT) ×2 IMPLANT
BUR MATCHSTICK NEURO 3.0 LAGG (BURR) ×2 IMPLANT
CAGE PEEK 7X14X11 (Cage) ×1 IMPLANT
CAGE PEEK 7X16X14 (Cage) ×2 IMPLANT
CAGE SPNL 11X14X7XRADOPQ (Cage) ×1 IMPLANT
CANISTER SUCT 3000ML PPV (MISCELLANEOUS) ×2 IMPLANT
CARTRIDGE OIL MAESTRO DRILL (MISCELLANEOUS) ×1 IMPLANT
DIFFUSER DRILL AIR PNEUMATIC (MISCELLANEOUS) ×2 IMPLANT
DRAPE C-ARM 42X72 X-RAY (DRAPES) ×4 IMPLANT
DRAPE LAPAROTOMY 100X72 PEDS (DRAPES) ×2 IMPLANT
DRAPE MICROSCOPE LEICA (MISCELLANEOUS) ×2 IMPLANT
DRSG OPSITE POSTOP 3X4 (GAUZE/BANDAGES/DRESSINGS) ×2 IMPLANT
DURAPREP 6ML APPLICATOR 50/CS (WOUND CARE) ×2 IMPLANT
ELECT COATED BLADE 2.86 ST (ELECTRODE) ×4 IMPLANT
ELECT REM PT RETURN 9FT ADLT (ELECTROSURGICAL) ×2
ELECTRODE REM PT RTRN 9FT ADLT (ELECTROSURGICAL) ×1 IMPLANT
GAUZE SPONGE 4X4 16PLY XRAY LF (GAUZE/BANDAGES/DRESSINGS) IMPLANT
GLOVE BIO SURGEON STRL SZ7 (GLOVE) ×2 IMPLANT
GLOVE BIO SURGEON STRL SZ8 (GLOVE) ×2 IMPLANT
GLOVE BIOGEL PI IND STRL 7.0 (GLOVE) ×1 IMPLANT
GLOVE BIOGEL PI INDICATOR 7.0 (GLOVE) ×1
GLOVE SURG SS PI 7.5 STRL IVOR (GLOVE) ×10 IMPLANT
GOWN STRL REUS W/ TWL LRG LVL3 (GOWN DISPOSABLE) ×3 IMPLANT
GOWN STRL REUS W/ TWL XL LVL3 (GOWN DISPOSABLE) ×1 IMPLANT
GOWN STRL REUS W/TWL 2XL LVL3 (GOWN DISPOSABLE) IMPLANT
GOWN STRL REUS W/TWL LRG LVL3 (GOWN DISPOSABLE) ×3
GOWN STRL REUS W/TWL XL LVL3 (GOWN DISPOSABLE) ×1
HEMOSTAT POWDER KIT SURGIFOAM (HEMOSTASIS) ×2 IMPLANT
KIT BASIN OR (CUSTOM PROCEDURE TRAY) ×2 IMPLANT
KIT TURNOVER KIT B (KITS) ×2 IMPLANT
NEEDLE HYPO 25X1 1.5 SAFETY (NEEDLE) ×2 IMPLANT
NEEDLE SPNL 20GX3.5 QUINCKE YW (NEEDLE) ×2 IMPLANT
NS IRRIG 1000ML POUR BTL (IV SOLUTION) ×2 IMPLANT
OIL CARTRIDGE MAESTRO DRILL (MISCELLANEOUS) ×2
PACK LAMINECTOMY NEURO (CUSTOM PROCEDURE TRAY) ×2 IMPLANT
PAD ARMBOARD 7.5X6 YLW CONV (MISCELLANEOUS) ×6 IMPLANT
PENCIL BUTTON HOLSTER BLD 10FT (ELECTRODE) ×2 IMPLANT
PIN DISTRACTION 14MM (PIN) ×4 IMPLANT
PLATE 2 40XLCK NS SPNE CVD (Plate) ×1 IMPLANT
PLATE 2 ATLANTIS TRANS (Plate) ×1 IMPLANT
RUBBERBAND STERILE (MISCELLANEOUS) ×4 IMPLANT
SCREW 4.0X14 (Screw) ×10 IMPLANT
SCREW ST 14X4XST FXANG SPNE (Screw) ×1 IMPLANT
SCREW ST FIX 4 ATL (Screw) ×1 IMPLANT
SPONGE INTESTINAL PEANUT (DISPOSABLE) ×2 IMPLANT
SPONGE SURGIFOAM ABS GEL SZ50 (HEMOSTASIS) IMPLANT
STRIP CLOSURE SKIN 1/2X4 (GAUZE/BANDAGES/DRESSINGS) ×2 IMPLANT
SUT VIC AB 3-0 SH 8-18 (SUTURE) ×4 IMPLANT
SUT VIC AB 4-0 PS2 27 (SUTURE) ×2 IMPLANT
TOWEL GREEN STERILE (TOWEL DISPOSABLE) ×2 IMPLANT
TOWEL GREEN STERILE FF (TOWEL DISPOSABLE) ×2 IMPLANT
WATER STERILE IRR 1000ML POUR (IV SOLUTION) ×2 IMPLANT

## 2018-02-14 NOTE — Op Note (Signed)
02/14/2018  10:03 AM  PATIENT:  [name]  67 y.o. female  PRE-OPERATIVE DIAGNOSIS:  Cervical spondylosis C3-4 and C4-5, neck and shoulder pain  POST-OPERATIVE DIAGNOSIS:  same  PROCEDURE:  1. Decompressive anterior cervical discectomy C3-4 and C4-5, 2. Anterior cervical arthrodesis C3-4 and C4-5 utilizing a peek interbody cage packed with locally harvested morcellized autologous bone graft, 3. Anterior cervical plating C3-4 and C4-5 utilizing an Atlantis translational plate  SURGEON:  Sherley Bounds, MD  ASSISTANTS: Glenford Peers FNP  ANESTHESIA:   General  EBL: 125 ml  Total I/O In: 1500 [I.V.:1500] Out: 125 [Blood:125]  BLOOD ADMINISTERED: none  DRAINS: none  SPECIMEN:  none  INDICATION FOR PROCEDURE: This patient presented with neck and shoulder pain. She had a previous ACDF with plating C5-C7. Imaging showed adjacent level spondylosis C3-4 and C4-5. The patient tried conservative measures without relief. Pain was debilitating. Recommended ACDF with plating. Patient understood the risks, benefits, and alternatives and potential outcomes and wished to proceed.  PROCEDURE DETAILS: Patient was brought to the operating room placed under general endotracheal anesthesia. Patient was placed in the supine position on the operating room table. The neck was prepped with Duraprep and draped in a sterile fashion.   Three cc of local anesthesia was injected and a transverse incision was made on the right side of the neck.  Dissection was carried down thru the subcutaneous tissue and the platysma was  elevated, opened, and undermined with Metzenbaum scissors.  Dissection was then carried out thru an avascular plane leaving the sternocleidomastoid carotid artery and jugular vein laterally and the trachea and esophagus medially. The ventral aspect of the vertebral column was identified and a localizing x-ray was taken. The C4-5 level was identified. The plate was identified and the tissues  were bluntly and sharply dissected away from the plate. The 6 screws were removed and the screw holes were filled with Surgifoam. The plate was removed. The longus colli muscles were then elevated and the retractor was placed to expose C3-4 and C4-5. The annulus was incised and the disc space entered at each level. Discectomy was performed with micro-curettes and pituitary rongeurs. I then used the high-speed drill to drill the endplates down to the level of the posterior longitudinal ligament. The drill shavings were saved in a mucous trap for later arthrodesis. The operating microscope was draped and brought into the field provided additional magnification, illumination and visualization. Discectomy was continued posteriorly thru the disc space. Posterior longitudinal ligament was opened with a nerve hook, and then removed along with disc herniation and osteophytes, decompressing the spinal canal and thecal sac. We then continued to remove osteophytic overgrowth and disc material decompressing the neural foramina and exiting nerve roots bilaterally. The scope was angled up and down to help decompress and undercut the vertebral bodies. Once the decompression was completed we could pass a nerve hook circumferentially to assure adequate decompression in the midline and in the neural foramina. So by both visualization and palpation we felt we had an adequate decompression of the neural elements. We then measured the height of the intravertebral disc space and selected a 7 millimeter Peek interbody cage packed with autograft. It was then gently positioned in the intravertebral disc spaces and countersunk. I then used a Atlantis translational plate and placed variable angle screws into the vertebral bodies of each level and locked them into position. The wound was irrigated with bacitracin solution, checked for hemostasis which was established and confirmed. Once meticulous hemostasis was  achieved, we then proceeded  with closure. The platysma was closed with interrupted 3-0 undyed Vicryl suture, the subcuticular layer was closed with interrupted 3-0 undyed Vicryl suture. The skin edges were approximated with steristrips. The drapes were removed. A sterile dressing was applied. The patient was then awakened from general anesthesia and transferred to the recovery room in stable condition. At the end of the procedure all sponge, needle and instrument counts were correct.   PLAN OF CARE: Admit for overnight observation  PATIENT DISPOSITION:  PACU - hemodynamically stable.   Delay start of Pharmacological VTE agent (>24hrs) due to surgical blood loss or risk of bleeding:  yes

## 2018-02-14 NOTE — Transfer of Care (Signed)
Immediate Anesthesia Transfer of Care Note  Patient: [name]  Procedure(s) Performed: CERVICAL THREE-FOUR,CERVICAL FOUR-FIVE ACDF, REMOVAL CERVICAL FIVE-SEVEN  PLATE. (N/A Spine Cervical)  Patient Location: PACU  Anesthesia Type:General  Level of Consciousness: awake, alert , patient cooperative and responds to stimulation  Airway & Oxygen Therapy: Patient Spontanous Breathing and Patient connected to face mask oxygen  Post-op Assessment: Report given to RN, Post -op Vital signs reviewed and stable and Patient moving all extremities X 4  Post vital signs: Reviewed and stable  Last Vitals:  Vitals Value Taken Time  BP    Temp    Pulse 90 02/14/2018 10:13 AM  Resp 14 02/14/2018 10:13 AM  SpO2 96 % 02/14/2018 10:13 AM  Vitals shown include unvalidated device data.  Last Pain:  Vitals:   02/14/18 0615  TempSrc:   PainSc: 3          Complications: No apparent anesthesia complications

## 2018-02-14 NOTE — Anesthesia Procedure Notes (Signed)
Procedure Name: Intubation Date/Time: 02/14/2018 7:47 AM Performed by: Glynda Jaeger, CRNA Pre-anesthesia Checklist: Patient identified, Patient being monitored, Timeout performed, Emergency Drugs available and Suction available Patient Re-evaluated:Patient Re-evaluated prior to induction Oxygen Delivery Method: Circle System Utilized Preoxygenation: Pre-oxygenation with 100% oxygen Induction Type: IV induction Ventilation: Mask ventilation without difficulty Laryngoscope Size: Mac and 3 Grade View: Grade I Tube type: Oral Tube size: 7.0 mm Number of attempts: 1 Airway Equipment and Method: Stylet Placement Confirmation: ETT inserted through vocal cords under direct vision,  positive ETCO2 and breath sounds checked- equal and bilateral Secured at: 23 cm Tube secured with: Tape Dental Injury: Teeth and Oropharynx as per pre-operative assessment

## 2018-02-14 NOTE — H&P (Signed)
Subjective:   Patient is a 67 y.o. female admitted for neck pain. Jessica patient first presented to me with complaints of neck pain. Onset of symptoms was several months ago. Jessica pain is described as aching and occurs all day. Jessica pain is rated severe, and is located in Jessica neck and radiates to Jessica shoulder. Jessica symptoms have been progressive. Symptoms are exacerbated by extending head backwards, and are relieved by Vang.  Previous work up includes MRI of cervical spine, results: disc bulge at C3-C4 and C4-C5 bilateral.  Past Medical History:  Diagnosis Date  . Arthritis    knees, hands  . Bursitis of left hip   . Cancer ([place])    hx skin cancer  . Depression   . GERD (gastroesophageal reflux disease)    INFREQUENT  . Headache    HX MIGRAINES   . Heart murmur     MVP 30 yrs ago - no treatment needed  . Hyperlipidemia    on statin  . Hypertension    "flucuations" has never been on any med  . Insomnia   . Irritable bowel syndrome    s/p subtotal colectomy '86>diarrhea prone  . NEOPLASM, MALIGNANT, BASAL CELL, CARCINOMA, HX OF   . Seasonal allergies     Past Surgical History:  Procedure Laterality Date  . ABDOMINOPLASTY    . [place]   lower back  . BREAST ENHANCEMENT SURGERY  1983  . BREAST IMPLANT REMOVAL  08/2010  . BREAST SURGERY     Augmentation in 1983, Reduction 2011  . BUNIONECTOMY     right  and left foot  . CERVICAL SPINE SURGERY  12/18/1999   C5/6 and C6/7  . COLECTOMY  1986  . COSMETIC SURGERY    . EXCISION/RELEASE BURSA HIP Left 07/04/2015   Procedure: LEFT HIP BURSECTOMY WITH ;  Surgeon: Gaynelle Arabian, MD;  Location: [facility];  Service: Orthopedics;  Laterality: Left;  . EYE SURGERY     Lasik 2010  . eyelid surgery    . FRACTURE SURGERY    . HYSTEROSCOPY W/D&C  08/17/2011   Procedure: DILATATION AND CURETTAGE (D&C) /HYSTEROSCOPY;  Surgeon: Margarette Asal;  Location: [facility];  Service: Gynecology;  Laterality: N/A;  . INJECTION KNEE Right 07/04/2015    Procedure: CORTISONE INJECTION RIGHT KNEE ;  Surgeon: Gaynelle Arabian, MD;  Location: [facility];  Service: Orthopedics;  Laterality: Right;  . KNEE ARTHROSCOPY     right x 2  . KNEE RECONSTRUCTION, MEDIAL PATELLAR FEMORAL LIGAMENT     right  . LASIK  2009    bilateral  . left foot surgery     removed foreign object  . OPEN SURGICAL REPAIR OF GLUTEAL TENDON Left 07/04/2015   Procedure: GLUTEAL TENDON REPAIR ;  Surgeon: Gaynelle Arabian, MD;  Location: [facility];  Service: Orthopedics;  Laterality: Left;  . REDUCTION MAMMAPLASTY Bilateral   . thumb surgery    . TOTAL KNEE ARTHROPLASTY Right 06/17/2017   Procedure: RIGHT TOTAL KNEE ARTHROPLASTY;  Surgeon: Gaynelle Arabian, MD;  Location: [facility];  Service: Orthopedics;  Laterality: Right;  . TUBAL LIGATION      Allergies  Allergen Reactions  . Nsaids Other (See Comments)    Hurts stomach    Social History   Tobacco Use  . Smoking status: Never Smoker  . Smokeless tobacco: Never Used  . 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 Alcohol use: Yes    Alcohol/week: 0.0 oz    Comment: occasional/ 2 drinks a month    Family History  Problem Relation Age of Onset  . Hypertension Mother   . Heart disease Mother   . Stomach cancer Father 65  . Cancer Father        stomach and liver cancer  . Diabetes Brother   . Heart disease Brother   . Hypertension Brother   . Cancer Daughter        breast cancer  . Hypertension Daughter   . Hypertension Brother   . Heart disease Brother   . Thyroid disease Sister   . Breast cancer Sister   . Hypertension Sister   . Psoriasis Sister   . High Cholesterol Sister   . Neuropathy Sister   . Colon cancer Neg Hx    Prior to Admission medications   Medication Sig Start Date End Date Taking? Authorizing Provider  acetaminophen (TYLENOL) 500 MG tablet Take 1,000 mg by mouth every 8 (eight) hours as needed for  moderate pain or headache.    Yes [provider]  atorvastatin (LIPITOR) 20 MG tablet Take 1 tablet (20 mg total) by mouth every morning. 10/02/17  Yes Stallings, Zoe A, MD  buPROPion (WELLBUTRIN XL) 300 MG 24 hr tablet Take 1 tablet (300 mg total) by mouth daily. 10/02/17  Yes Stallings, Arlie Solomons, MD  Calcium-Phosphorus-Vitamin D (CITRACAL +D3 PO) Take 1,000 Units by mouth daily.    Yes [provider]  cholecalciferol (VITAMIN D) 1000 units tablet Take 1,000 Units by mouth daily.   Yes [provider]  Cyanocobalamin (VITAMIN B12) 1000 MCG TBCR Take 1,000 mcg by mouth daily.   Yes [provider]  diclofenac sodium (VOLTAREN) 1 % GEL Apply 1 g topically daily as needed.    Yes [provider]  diphenoxylate-atropine (LOMOTIL) 2.5-0.025 MG per tablet Take 1 tablet by mouth daily.    Yes [provider]  furosemide (LASIX) 40 MG tablet Take 1 tablet (40 mg total) by mouth daily. 10/02/17  Yes Forrest Moron, MD  HYDROcodone-acetaminophen (NORCO) 10-325 MG tablet Take 0.5 tablets by mouth daily as needed for severe pain.    Yes [provider]  loratadine (CLARITIN) 10 MG tablet Take 10 mg by mouth daily.    Yes [provider]  Magnesium 300 MG CAPS Take 300 mg by mouth daily.    Yes [provider]  methocarbamol (ROBAXIN) 500 MG tablet Take 500 mg by mouth at bedtime as needed for muscle spasms.    Yes [provider]  niacinamide 500 MG tablet Take 500 mg by mouth 2 (two) times daily with a meal.   Yes [provider]  promethazine (PHENERGAN) 12.5 MG tablet Take 0.5-1 tablets (6.25-12.5 mg total) by mouth every 6 (six) hours as needed for nausea (migraines). 10/02/17  Yes Stallings, Zoe A, MD  Simethicone (PHAZYME PO) Take 1 tablet by mouth every morning.    Yes [provider]  ciprofloxacin (CIPRO) 500 MG tablet Take 1 tablet (500 mg total) by mouth 2 (two) times daily. Patient not taking:  Reported on 01/23/2018 09/23/17   Forrest Moron, MD  fluorouracil (EFUDEX) 5 % cream Apply 1 application topically See admin instructions. In January applies daily for 3 weeks then off for 1-3 weeks then resume for 3 weeks. "For skin cancers" 05/31/15   [provider]  HYDROcodone-acetaminophen (NORCO/VICODIN) 5-325 MG tablet Take  1-2 tablets by mouth every 6 (six) hours as needed. Patient not taking: Reported on 01/23/2018 09/01/17   Forde Dandy, MD  [facility] PAIN RELIEF PATCH EX) Apply 2 patches topically daily.    [provider]     Review of Systems  Positive ROS: neg  All other systems have been reviewed and were otherwise negative with Jessica exception of those mentioned in Jessica HPI and as above.  Objective: Vital signs in last 24 hours: Temp:  [97.6 F (36.4 C)] 97.6 F (36.4 C) (06/07 0547) Pulse Rate:  [79] 79 (06/07 0547) Resp:  [20] 20 (06/07 0547) BP: (156)/(79) 156/79 (06/07 0547) SpO2:  [97 %] 97 % (06/07 0547)  General Appearance: Alert, cooperative, no distress, appears stated age Head: Normocephalic, without obvious abnormality, atraumatic Eyes: PERRL, conjunctiva/corneas clear, EOM's intact      Neck: Supple, symmetrical, trachea midline, Back: Symmetric, no curvature, ROM normal, no CVA tenderness Lungs:  respirations unlabored Heart: Regular rate and rhythm Abdomen: Soft, non-tender Extremities: Extremities normal, atraumatic, no cyanosis or edema Pulses: 2+ and symmetric all extremities Skin: Skin color, texture, turgor normal, no rashes or lesions  NEUROLOGIC:  Mental status: Alert and oriented x4, no aphasia, good attention span, fund of knowledge and memory  Motor Exam - grossly normal Sensory Exam - grossly normal Reflexes: 1+ Coordination - grossly normal Gait - grossly normal Balance - grossly normal Cranial Nerves: I: smell Not tested  II: visual acuity  OS: nl    OD: nl  II: visual fields Full to confrontation   II: pupils Equal, round, reactive to light  III,VII: ptosis Vang  III,IV,VI: extraocular muscles  Full ROM  V: mastication Normal  V: facial light touch sensation  Normal  V,VII: corneal reflex  Present  VII: facial muscle function - upper  Normal  VII: facial muscle function - lower Normal  VIII: hearing Not tested  IX: soft palate elevation  Normal  IX,X: gag reflex Present  XI: trapezius strength  5/5  XI: sternocleidomastoid strength 5/5  XI: neck flexion strength  5/5  XII: tongue strength  Normal    Data Review Lab Results  Component Value Date   WBC 7.1 01/31/2018   HGB 15.8 (H) 01/31/2018   HCT 48.5 (H) 01/31/2018   MCV 92.2 01/31/2018   PLT 275 01/31/2018   Lab Results  Component Value Date   NA 140 01/31/2018   K 3.5 01/31/2018   CL 102 01/31/2018   CO2 26 01/31/2018   BUN 13 01/31/2018   CREATININE 0.89 01/31/2018   GLUCOSE 110 (H) 01/31/2018   Lab Results  Component Value Date   INR 0.98 01/31/2018    Assessment:   Cervical neck pain with herniated nucleus pulposus/ spondylosis/ stenosis at C3-5. Estimated body mass index is 28.01 kg/m as calculated from Jessica following:   Height as of 01/31/18: 5' 7.5" (1.715 m).   Weight as of 01/31/18: 82.3 kg (181 lb 8 oz).  Patient has failed conservative therapy. Planned surgery : ACDF C3-4 C4-5  Plan:   I explained Jessica condition and procedure to Jessica patient and answered any questions.  Patient wishes to proceed with procedure as planned. Understands risks/ benefits/ and expected or typical outcomes.  Theodosia Bahena S 02/14/2018 7:17 AM

## 2018-02-14 NOTE — Anesthesia Postprocedure Evaluation (Signed)
Anesthesia Post Note  Patient: [name]  Procedure(s) Performed: CERVICAL THREE-FOUR,CERVICAL FOUR-FIVE ACDF, REMOVAL CERVICAL FIVE-SEVEN  PLATE. (N/A Spine Cervical)     Patient location during evaluation: PACU Anesthesia Type: General Level of consciousness: awake and alert Pain management: pain level controlled Vital Signs Assessment: post-procedure vital signs reviewed and stable Respiratory status: spontaneous breathing, nonlabored ventilation, respiratory function stable and patient connected to nasal cannula oxygen Cardiovascular status: blood pressure returned to baseline and stable Postop Assessment: no apparent nausea or vomiting Anesthetic complications: no    Last Vitals:  Vitals:   02/14/18 1115 02/14/18 1135  BP: 140/73 (!) 144/78  Pulse: 90 86  Resp: 14 18  Temp: (!) 36.4 C 36.6 C  SpO2: 99% 93%    Last Pain:  Vitals:   02/14/18 1213  TempSrc:   PainSc: 7                  Crosby Oriordan

## 2018-02-15 DIAGNOSIS — Z85828 Personal history of other malignant neoplasm of skin: Secondary | ICD-10-CM | POA: Diagnosis not present

## 2018-02-15 DIAGNOSIS — F329 Major depressive disorder, single episode, unspecified: Secondary | ICD-10-CM | POA: Diagnosis not present

## 2018-02-15 DIAGNOSIS — K219 Gastro-esophageal reflux disease without esophagitis: Secondary | ICD-10-CM | POA: Diagnosis not present

## 2018-02-15 DIAGNOSIS — E785 Hyperlipidemia, unspecified: Secondary | ICD-10-CM | POA: Diagnosis not present

## 2018-02-15 DIAGNOSIS — M4722 Other spondylosis with radiculopathy, cervical region: Secondary | ICD-10-CM | POA: Diagnosis not present

## 2018-02-15 DIAGNOSIS — I10 Essential (primary) hypertension: Secondary | ICD-10-CM | POA: Diagnosis not present

## 2018-02-15 MED ORDER — CYCLOBENZAPRINE HCL 10 MG PO TABS: 10.0000 mg | ORAL_TABLET | Freq: Three times a day (TID) | ORAL | 2 refills | Status: DC | PRN

## 2018-02-15 MED ORDER — HYDROCODONE-ACETAMINOPHEN 10-325 MG PO TABS: 1.0000 | ORAL_TABLET | ORAL | 0 refills | Status: DC | PRN

## 2018-02-15 NOTE — Progress Notes (Signed)
Patient alert and oriented, mae's well, voiding adequate amount of urine, swallowing without difficulty, c/o mild pain at time of discharge. Patient discharged home with family. Script and discharged instructions given to patient. Patient and family stated understanding of instructions given. Patient has an appointment with Dr. Jones   

## 2018-02-15 NOTE — Discharge Summary (Signed)
Physician Discharge Summary  Patient ID: [name] MRN: [id number] DOB/AGE: [birth year] 67 y.o.  Admit date: 02/14/2018 Discharge date: 02/15/2018  Admission Diagnoses:  Cervical radiculopathy  Discharge Diagnoses:  Same Active Problems:   Cervical vertebral fusion   Discharged Condition: Stable  Hospital Course:  [name] is a 67 y.o. female who was admitted for the below procedure. There were no post operative complications. At time of discharge, pain was well controlled, ambulating with Pt/OT, tolerating po, voiding normal. Ready for discharge.  Treatments: Surgery 1. Decompressive anterior cervical discectomy C3-4 and C4-5, 2. Anterior cervical arthrodesis C3-4 and C4-5 utilizing a peek interbody cage packed with locally harvested morcellized autologous bone graft, 3. Anterior cervical plating C3-4 and C4-5 utilizing an Atlantis translational plate  Discharge Exam: Blood pressure 116/60, pulse 85, temperature 97.8 F (36.6 C), temperature source Oral, resp. rate 16, SpO2 100 %. Awake, alert, oriented Speech fluent, appropriate CN grossly intact 5/5 BUE/BLE Wound c/d/i  Disposition: Discharge disposition: 01-Home or Self Care       Discharge Instructions    Call MD for:  difficulty breathing, headache or visual disturbances   Complete by:  As directed    Call MD for:  persistant dizziness or light-headedness   Complete by:  As directed    Call MD for:  redness, tenderness, or signs of infection (pain, swelling, redness, odor or green/yellow discharge around incision site)   Complete by:  As directed    Call MD for:  severe uncontrolled pain   Complete by:  As directed    Call MD for:  temperature >100.4   Complete by:  As directed    Diet general   Complete by:  As directed    Driving Restrictions   Complete by:  As directed    Do not drive until given clearance.   Increase activity slowly   Complete by:  As directed    Lifting restrictions    Complete by:  As directed    Do not lift anything >10lbs. Avoid bending and twisting in awkward positions. Avoid bending at the back.   May shower / Bathe   Complete by:  As directed    In 24 hours. Okay to wash wound with warm soapy water. Avoid scrubbing the wound. Pat dry.   Remove dressing in 24 hours   Complete by:  As directed      Allergies as of 02/15/2018      Reactions   Nsaids Other (See Comments)   Hurts stomach      Medication List    STOP taking these medications   methocarbamol 500 MG tablet Commonly known as:  ROBAXIN     TAKE these medications   acetaminophen 500 MG tablet Commonly known as:  TYLENOL Take 1,000 mg by mouth every 8 (eight) hours as needed for moderate pain or headache.   atorvastatin 20 MG tablet Commonly known as:  LIPITOR Take 1 tablet (20 mg total) by mouth every morning.   buPROPion 300 MG 24 hr tablet Commonly known as:  WELLBUTRIN XL Take 1 tablet (300 mg total) by mouth daily.   cholecalciferol 1000 units tablet Commonly known as:  VITAMIN D Take 1,000 Units by mouth daily.   ciprofloxacin 500 MG tablet Commonly known as:  CIPRO Take 1 tablet (500 mg total) by mouth 2 (two) times daily.   CITRACAL +D3 PO Take 1,000 Units by mouth daily.   cyclobenzaprine 10 MG tablet Commonly known as:  FLEXERIL Take  1 tablet (10 mg total) by mouth 3 (three) times daily as needed for muscle spasms (not relieved by Robaxin).   diclofenac sodium 1 % Gel Commonly known as:  VOLTAREN Apply 1 g topically daily as needed.   diphenoxylate-atropine 2.5-0.025 MG tablet Commonly known as:  LOMOTIL Take 1 tablet by mouth daily.   fluorouracil 5 % cream Commonly known as:  EFUDEX Apply 1 application topically See admin instructions. In January applies daily for 3 weeks then off for 1-3 weeks then resume for 3 weeks. "For skin cancers"   furosemide 40 MG tablet Commonly known as:  LASIX Take 1 tablet (40 mg total) by mouth daily.    HYDROcodone-acetaminophen 10-325 MG tablet Commonly known as:  NORCO Take 1 tablet by mouth every 4 (four) hours as needed for moderate pain ((score 4 to 6)). What changed:    how much to take  when to take this  reasons to take this  Another medication with the same name was removed. Continue taking this medication, and follow the directions you see here.   loratadine 10 MG tablet Commonly known as:  CLARITIN Take 10 mg by mouth daily.   Magnesium 300 MG Caps Take 300 mg by mouth daily.   niacinamide 500 MG tablet Take 500 mg by mouth 2 (two) times daily with a meal.   PHAZYME PO Take 1 tablet by mouth every morning.   promethazine 12.5 MG tablet Commonly known as:  PHENERGAN Take 0.5-1 tablets (6.25-12.5 mg total) by mouth every 6 (six) hours as needed for nausea (migraines).   SALONPAS PAIN RELIEF PATCH EX Apply 2 patches topically daily.   Vitamin B12 1000 MCG Tbcr Take 1,000 mcg by mouth daily.      Follow-up Information    Jessica Moore, MD Follow up.   Specialty:  Neurosurgery Contact information: [address] [place] [address] [place] [address] [phone]           Signed: Traci Sermon 02/15/2018, 8:44 AM

## 2018-02-17 ENCOUNTER — Encounter (HOSPITAL_COMMUNITY): Payer: Self-pay | Admitting: Neurological Surgery

## 2018-03-03 DIAGNOSIS — M542 Cervicalgia: Secondary | ICD-10-CM | POA: Diagnosis not present

## 2018-03-03 DIAGNOSIS — Z6828 Body mass index (BMI) 28.0-28.9, adult: Secondary | ICD-10-CM | POA: Diagnosis not present

## 2018-03-24 DIAGNOSIS — M961 Postlaminectomy syndrome, not elsewhere classified: Secondary | ICD-10-CM | POA: Diagnosis not present

## 2018-03-24 DIAGNOSIS — M5030 Other cervical disc degeneration, unspecified cervical region: Secondary | ICD-10-CM | POA: Diagnosis not present

## 2018-03-24 DIAGNOSIS — M5136 Other intervertebral disc degeneration, lumbar region: Secondary | ICD-10-CM | POA: Diagnosis not present

## 2018-03-27 DIAGNOSIS — H532 Diplopia: Secondary | ICD-10-CM | POA: Diagnosis not present

## 2018-03-28 DIAGNOSIS — M7062 Trochanteric bursitis, left hip: Secondary | ICD-10-CM | POA: Diagnosis not present

## 2018-03-28 DIAGNOSIS — M25552 Pain in left hip: Secondary | ICD-10-CM | POA: Diagnosis not present

## 2018-04-29 DIAGNOSIS — M5136 Other intervertebral disc degeneration, lumbar region: Secondary | ICD-10-CM | POA: Diagnosis not present

## 2018-04-29 DIAGNOSIS — M961 Postlaminectomy syndrome, not elsewhere classified: Secondary | ICD-10-CM | POA: Diagnosis not present

## 2018-05-19 ENCOUNTER — Telehealth: Payer: Self-pay | Admitting: Family Medicine

## 2018-05-19 DIAGNOSIS — Z7184 Encounter for health counseling related to travel: Secondary | ICD-10-CM

## 2018-05-19 NOTE — Telephone Encounter (Signed)
Cipro 500 MG refill requested for travelers diarrhea or uti. She will be traveling out of the country, soon. Last Refill 09/23/17 #6 tabs no refills Last OV: 09/23/17 PCP: Dr. Nolon Rod Pharmacy:[facility]  Routing for considertion

## 2018-05-19 NOTE — Telephone Encounter (Signed)
Copied from [place]. Topic: Quick Communication - Rx Refill/Question >> May 19, 2018  1:12 PM Mcneil, Ja-Kwan wrote: Medication: ciprofloxacin (CIPRO) 500 MG tablet  Has the patient contacted their pharmacy? no  Preferred Pharmacy (with phone number or street name): [facility], [place] [phone] (Phone) [phone] (Fax)  Agent: Please be advised that RX refills may take up to 3 business days. We ask that you follow-up with your pharmacy.

## 2018-05-20 NOTE — Telephone Encounter (Signed)
Please advise 

## 2018-05-21 MED ORDER — CIPROFLOXACIN HCL 500 MG PO TABS: 500.0000 mg | ORAL_TABLET | Freq: Two times a day (BID) | ORAL | 2 refills | Status: DC

## 2018-05-21 NOTE — Telephone Encounter (Signed)
Refill sent in. Please notify the patient.  

## 2018-05-21 NOTE — Addendum Note (Signed)
Addended by: [name] A on: 05/21/2018 09:52 AM   Modules accepted: Orders

## 2018-06-16 DIAGNOSIS — L570 Actinic keratosis: Secondary | ICD-10-CM | POA: Diagnosis not present

## 2018-06-16 DIAGNOSIS — L814 Other melanin hyperpigmentation: Secondary | ICD-10-CM | POA: Diagnosis not present

## 2018-06-16 DIAGNOSIS — Z23 Encounter for immunization: Secondary | ICD-10-CM | POA: Diagnosis not present

## 2018-06-16 DIAGNOSIS — D225 Melanocytic nevi of trunk: Secondary | ICD-10-CM | POA: Diagnosis not present

## 2018-06-16 DIAGNOSIS — Z85828 Personal history of other malignant neoplasm of skin: Secondary | ICD-10-CM | POA: Diagnosis not present

## 2018-06-16 DIAGNOSIS — M542 Cervicalgia: Secondary | ICD-10-CM | POA: Diagnosis not present

## 2018-06-16 DIAGNOSIS — L821 Other seborrheic keratosis: Secondary | ICD-10-CM | POA: Diagnosis not present

## 2018-06-16 DIAGNOSIS — Z86018 Personal history of other benign neoplasm: Secondary | ICD-10-CM | POA: Diagnosis not present

## 2018-06-26 DIAGNOSIS — S72141A Displaced intertrochanteric fracture of right femur, initial encounter for closed fracture: Secondary | ICD-10-CM | POA: Diagnosis not present

## 2018-07-02 DIAGNOSIS — M542 Cervicalgia: Secondary | ICD-10-CM | POA: Diagnosis not present

## 2018-07-07 DIAGNOSIS — D0471 Carcinoma in situ of skin of right lower limb, including hip: Secondary | ICD-10-CM | POA: Diagnosis not present

## 2018-07-07 DIAGNOSIS — L570 Actinic keratosis: Secondary | ICD-10-CM | POA: Diagnosis not present

## 2018-07-07 DIAGNOSIS — L820 Inflamed seborrheic keratosis: Secondary | ICD-10-CM | POA: Diagnosis not present

## 2018-07-07 DIAGNOSIS — L821 Other seborrheic keratosis: Secondary | ICD-10-CM | POA: Diagnosis not present

## 2018-07-07 DIAGNOSIS — L814 Other melanin hyperpigmentation: Secondary | ICD-10-CM | POA: Diagnosis not present

## 2018-07-07 DIAGNOSIS — D485 Neoplasm of uncertain behavior of skin: Secondary | ICD-10-CM | POA: Diagnosis not present

## 2018-07-07 DIAGNOSIS — C44712 Basal cell carcinoma of skin of right lower limb, including hip: Secondary | ICD-10-CM | POA: Diagnosis not present

## 2018-07-08 DIAGNOSIS — M542 Cervicalgia: Secondary | ICD-10-CM | POA: Diagnosis not present

## 2018-07-14 DIAGNOSIS — M542 Cervicalgia: Secondary | ICD-10-CM | POA: Diagnosis not present

## 2018-07-21 DIAGNOSIS — M542 Cervicalgia: Secondary | ICD-10-CM | POA: Diagnosis not present

## 2018-07-23 DIAGNOSIS — M1711 Unilateral primary osteoarthritis, right knee: Secondary | ICD-10-CM | POA: Diagnosis not present

## 2018-07-31 DIAGNOSIS — M542 Cervicalgia: Secondary | ICD-10-CM | POA: Diagnosis not present

## 2018-08-04 DIAGNOSIS — M542 Cervicalgia: Secondary | ICD-10-CM | POA: Diagnosis not present

## 2018-08-12 DIAGNOSIS — M542 Cervicalgia: Secondary | ICD-10-CM | POA: Diagnosis not present

## 2018-08-18 DIAGNOSIS — M542 Cervicalgia: Secondary | ICD-10-CM | POA: Diagnosis not present

## 2018-08-20 DIAGNOSIS — C44712 Basal cell carcinoma of skin of right lower limb, including hip: Secondary | ICD-10-CM | POA: Diagnosis not present

## 2018-08-20 DIAGNOSIS — L905 Scar conditions and fibrosis of skin: Secondary | ICD-10-CM | POA: Diagnosis not present

## 2018-08-20 DIAGNOSIS — C44722 Squamous cell carcinoma of skin of right lower limb, including hip: Secondary | ICD-10-CM | POA: Diagnosis not present

## 2018-08-28 DIAGNOSIS — M542 Cervicalgia: Secondary | ICD-10-CM | POA: Diagnosis not present

## 2018-09-10 DIAGNOSIS — M6530 Trigger finger, unspecified finger: Secondary | ICD-10-CM | POA: Insufficient documentation

## 2018-09-16 DIAGNOSIS — Z6827 Body mass index (BMI) 27.0-27.9, adult: Secondary | ICD-10-CM | POA: Diagnosis not present

## 2018-09-16 DIAGNOSIS — M25512 Pain in left shoulder: Secondary | ICD-10-CM | POA: Diagnosis not present

## 2018-09-16 DIAGNOSIS — G8929 Other chronic pain: Secondary | ICD-10-CM | POA: Diagnosis not present

## 2018-09-16 DIAGNOSIS — M542 Cervicalgia: Secondary | ICD-10-CM | POA: Diagnosis not present

## 2018-09-22 ENCOUNTER — Other Ambulatory Visit: Payer: Self-pay | Admitting: Neurological Surgery

## 2018-09-22 DIAGNOSIS — M542 Cervicalgia: Secondary | ICD-10-CM

## 2018-09-23 DIAGNOSIS — M79641 Pain in right hand: Secondary | ICD-10-CM | POA: Diagnosis not present

## 2018-09-24 ENCOUNTER — Ambulatory Visit: Payer: Medicare Other

## 2018-09-24 ENCOUNTER — Encounter: Payer: Self-pay | Admitting: *Deleted

## 2018-09-24 ENCOUNTER — Ambulatory Visit (INDEPENDENT_AMBULATORY_CARE_PROVIDER_SITE_OTHER): Payer: Medicare Other | Admitting: Family Medicine

## 2018-09-24 ENCOUNTER — Other Ambulatory Visit: Payer: Self-pay | Admitting: *Deleted

## 2018-09-24 VITALS — BP 146/78

## 2018-09-24 DIAGNOSIS — E782 Mixed hyperlipidemia: Secondary | ICD-10-CM

## 2018-09-24 DIAGNOSIS — Z0000 Encounter for general adult medical examination without abnormal findings: Secondary | ICD-10-CM

## 2018-09-24 DIAGNOSIS — R9389 Abnormal findings on diagnostic imaging of other specified body structures: Secondary | ICD-10-CM

## 2018-09-24 DIAGNOSIS — I10 Essential (primary) hypertension: Secondary | ICD-10-CM

## 2018-09-24 DIAGNOSIS — Z7184 Encounter for health counseling related to travel: Secondary | ICD-10-CM

## 2018-09-24 DIAGNOSIS — Z8349 Family history of other endocrine, nutritional and metabolic diseases: Secondary | ICD-10-CM

## 2018-09-24 MED ORDER — FUROSEMIDE 40 MG PO TABS: 40.0000 mg | ORAL_TABLET | Freq: Every day | ORAL | 0 refills | Status: DC

## 2018-09-24 MED ORDER — ATORVASTATIN CALCIUM 20 MG PO TABS: 20.0000 mg | ORAL_TABLET | Freq: Every morning | ORAL | 0 refills | Status: DC

## 2018-09-24 MED ORDER — PROMETHAZINE HCL 12.5 MG PO TABS: 6.2500 mg | ORAL_TABLET | Freq: Four times a day (QID) | ORAL | 0 refills | Status: DC | PRN

## 2018-09-24 MED ORDER — BUPROPION HCL ER (XL) 300 MG PO TB24: 300.0000 mg | ORAL_TABLET | Freq: Every day | ORAL | 0 refills | Status: DC

## 2018-09-24 MED ORDER — DIPHENOXYLATE-ATROPINE 2.5-0.025 MG PO TABS: 1.0000 | ORAL_TABLET | Freq: Every day | ORAL | 1 refills | Status: DC

## 2018-09-24 MED ORDER — CIPROFLOXACIN HCL 500 MG PO TABS: 500.0000 mg | ORAL_TABLET | Freq: Two times a day (BID) | ORAL | 1 refills | Status: DC

## 2018-09-24 NOTE — Patient Instructions (Signed)
Healthy Eating Following a healthy eating pattern may help you to achieve and maintain a healthy body weight, reduce the risk of chronic disease, and live a long and productive life. It is important to follow a healthy eating pattern at an appropriate calorie level for your body. Your nutritional needs should be met primarily through food by choosing a variety of nutrient-rich foods. What are tips for following this plan? Reading food labels  Read labels and choose the following: ? Reduced or low sodium. ? Juices with 100% fruit juice. ? Foods with low saturated fats and high polyunsaturated and monounsaturated fats. ? Foods with whole grains, such as whole wheat, cracked wheat, brown rice, and wild rice. ? Whole grains that are fortified with folic acid. This is recommended for women who are pregnant or who want to become pregnant.  Read labels and avoid the following: ? Foods with a lot of added sugars. These include foods that contain brown sugar, corn sweetener, corn syrup, dextrose, fructose, glucose, high-fructose corn syrup, honey, invert sugar, lactose, malt syrup, maltose, molasses, raw sugar, sucrose, trehalose, or turbinado sugar.  Do not eat more than the following amounts of added sugar per day:  6 teaspoons (25 g) for women.  9 teaspoons (38 g) for men. ? Foods that contain processed or refined starches and grains. ? Refined grain products, such as white flour, degermed cornmeal, white bread, and white rice. Shopping  Choose nutrient-rich snacks, such as vegetables, whole fruits, and nuts. Avoid high-calorie and high-sugar snacks, such as potato chips, fruit snacks, and candy.  Use oil-based dressings and spreads on foods instead of solid fats such as butter, stick margarine, or cream cheese.  Limit pre-made sauces, mixes, and "instant" products such as flavored rice, instant noodles, and ready-made pasta.  Try more plant-protein sources, such as tofu, tempeh, black beans,  edamame, lentils, nuts, and seeds.  Explore eating plans such as the Mediterranean diet or vegetarian diet. Cooking  Use oil to saut or stir-fry foods instead of solid fats such as butter, stick margarine, or lard.  Try baking, boiling, grilling, or broiling instead of frying.  Remove the fatty part of meats before cooking.  Steam vegetables in water or broth. Meal planning   At meals, imagine dividing your plate into fourths: ? One-half of your plate is fruits and vegetables. ? One-fourth of your plate is whole grains. ? One-fourth of your plate is protein, especially lean meats, poultry, eggs, tofu, beans, or nuts.  Include low-fat dairy as part of your daily diet. Lifestyle  Choose healthy options in all settings, including home, work, school, restaurants, or stores.  Prepare your food safely: ? Wash your hands after handling raw meats. ? Keep food preparation surfaces clean by regularly washing with hot, soapy water. ? Keep raw meats separate from ready-to-eat foods, such as fruits and vegetables. ? Cook seafood, meat, poultry, and eggs to the recommended internal temperature. ? Store foods at safe temperatures. In general:  Keep cold foods at 59F (4.4C) or below.  Keep hot foods at 159F (60C) or above.  Keep your freezer at [facility] (-17.8C) or below.  Foods are no longer safe to eat when they have been between the temperatures of 40-159F (4.4-60C) for more than 2 hours. What foods should I eat? Fruits Aim to eat 2 cup-equivalents of fresh, canned (in natural juice), or frozen fruits each day. Examples of 1 cup-equivalent of fruit include 1 small apple, 8 large strawberries, 1 cup canned fruit,  cup  dried fruit, or 1 cup 100% juice. Vegetables Aim to eat 2-3 cup-equivalents of fresh and frozen vegetables each day, including different varieties and colors. Examples of 1 cup-equivalent of vegetables include 2 medium carrots, 2 cups raw, leafy greens, 1 cup chopped  vegetable (raw or cooked), or 1 medium baked potato. Grains Aim to eat 6 ounce-equivalents of whole grains each day. Examples of 1 ounce-equivalent of grains include 1 slice of bread, 1 cup ready-to-eat cereal, 3 cups popcorn, or  cup cooked rice, pasta, or cereal. Meats and other proteins Aim to eat 5-6 ounce-equivalents of protein each day. Examples of 1 ounce-equivalent of protein include 1 egg, 1/2 cup nuts or seeds, or 1 tablespoon (16 g) peanut butter. A cut of meat or fish that is the size of a deck of cards is about 3-4 ounce-equivalents.  Of the protein you eat each week, try to have at least 8 ounces come from seafood. This includes salmon, trout, herring, and anchovies. Dairy Aim to eat 3 cup-equivalents of fat-free or low-fat dairy each day. Examples of 1 cup-equivalent of dairy include 1 cup (240 mL) milk, 8 ounces (250 g) yogurt, 1 ounces (44 g) natural cheese, or 1 cup (240 mL) fortified soy milk. Fats and oils  Aim for about 5 teaspoons (21 g) per day. Choose monounsaturated fats, such as canola and olive oils, avocados, peanut butter, and most nuts, or polyunsaturated fats, such as sunflower, corn, and soybean oils, walnuts, pine nuts, sesame seeds, sunflower seeds, and flaxseed. Beverages  Aim for six 8-oz glasses of water per day. Limit coffee to three to five 8-oz cups per day.  Limit caffeinated beverages that have added calories, such as soda and energy drinks.  Limit alcohol intake to no more than 1 drink a day for nonpregnant women and 2 drinks a day for men. One drink equals 12 oz of beer (355 mL), 5 oz of wine (148 mL), or 1 oz of hard liquor (44 mL). Seasoning and other foods  Avoid adding excess amounts of salt to your foods. Try flavoring foods with herbs and spices instead of salt.  Avoid adding sugar to foods.  Try using oil-based dressings, sauces, and spreads instead of solid fats. This information is based on general [place] nutrition guidelines. For more  information, visit [employer]. Exact amounts may vary based on your nutrition needs. Summary  A healthy eating plan may help you to maintain a healthy weight, reduce the risk of chronic diseases, and stay active throughout your life.  Plan your meals. Make sure you eat the right portions of a variety of nutrient-rich foods.  Try baking, boiling, grilling, or broiling instead of frying.  Choose healthy options in all settings, including home, work, school, restaurants, or stores. This information is not intended to replace advice given to you by your health care provider. Make sure you discuss any questions you have with your health care provider.

## 2018-09-24 NOTE — Progress Notes (Signed)
Presents today for [employer] Visit   Date of last exam: 09/23/2017  Interpreter used for this visit? no-Ex:[id number]}  Patient Care Team: Forrest Moron, MD as PCP - General (Internal Medicine) Molli Posey, MD (Obstetrics and Gynecology) [facility], Jennefer Bravo, MD (Dermatology) Gaynelle Arabian, MD (Orthopedic Surgery) [place], Heather, OD [facility])    ADVANCE DIRECTIVES: Discussed: yes  On File:no  Immunization status:  Immunization History  Administered Date(s) Administered  . Influenza Split 05/29/2012  . Influenza Whole 06/07/2009, 07/06/2010  . Influenza,inj,Quad PF,6+ Mos 08/03/2013, 06/18/2014, 06/09/2015  . Influenza-Unspecified 06/10/2016  . Pneumococcal Conjugate-13 09/14/2015  . Pneumococcal Polysaccharide-23 09/19/2016  . Tdap 05/29/2012, 04/15/2017  . Zoster 08/03/2013     Health Maintenance Due  Topic Date Due  . DEXA SCAN  06/01/2015     Functional Status Survey:     6CIT Screen 09/24/2018 09/23/2017  What Year? 0 points 0 points  What month? 0 points 0 points  What time? 0 points 0 points  Count back from 20 0 points 0 points  Months in reverse 0 points 0 points  Repeat phrase 0 points 0 points  Total Score 0 0        Clinical Support from 09/24/2018 in [facility] at [place]  AUDIT-C Score  0       Home Environment:  Home free of rugs    Patient Active Problem List   Diagnosis Date Noted  . Cervical vertebral fusion 02/14/2018  . OA (osteoarthritis) of knee 06/17/2017  . Trochanteric bursitis of left hip 07/03/2015  . Routine general medical examination at a health care facility 11/18/2014  . CARCINOMA, SKIN, SQUAMOUS CELL 07/06/2010  . ACTINIC KERATOSIS 07/06/2010  . NEOPLASM, MALIGNANT, BASAL CELL, CARCINOMA, HX OF 07/06/2010  . Hyperlipidemia 06/08/2009  . DEPRESSION 06/08/2009  . POSTMENOPAUSAL STATUS 06/08/2009  . IRRITABLE BOWEL SYNDROME 02/17/2008  . BACK PAIN, CHRONIC 02/17/2008  .  MIGRAINE HEADACHE 02/05/2007  . Essential hypertension 02/05/2007     Past Medical History:  Diagnosis Date  . Arthritis    knees, hands  . Bursitis of left hip   . Cancer ([place])    hx skin cancer  . Depression   . GERD (gastroesophageal reflux disease)    INFREQUENT  . Headache    HX MIGRAINES   . Heart murmur     MVP 30 yrs ago - no treatment needed  . Hyperlipidemia    on statin  . Hypertension    "flucuations" has never been on any med  . Insomnia   . Irritable bowel syndrome    s/p subtotal colectomy '86>diarrhea prone  . NEOPLASM, MALIGNANT, BASAL CELL, CARCINOMA, HX OF   . Seasonal allergies      Past Surgical History:  Procedure Laterality Date  . ABDOMINOPLASTY    . ANTERIOR CERVICAL DECOMP/DISCECTOMY FUSION N/A 02/14/2018   Procedure: CERVICAL THREE-FOUR,CERVICAL FOUR-FIVE ACDF, REMOVAL CERVICAL FIVE-SEVEN  PLATE.;  Surgeon: Eustace Moore, MD;  Location: [facility];  Service: Neurosurgery;  Laterality: N/A;  anterior  . [place]   lower back  . BREAST ENHANCEMENT SURGERY  1983  . BREAST IMPLANT REMOVAL  08/2010  . BREAST SURGERY     Augmentation in 1983, Reduction 2011  . BUNIONECTOMY     right  and left foot  . CERVICAL SPINE SURGERY  12/18/1999   C5/6 and C6/7  . COLECTOMY  1986  . COSMETIC SURGERY    . EXCISION/RELEASE BURSA HIP Left  07/04/2015   Procedure: LEFT HIP BURSECTOMY WITH ;  Surgeon: Gaynelle Arabian, MD;  Location: [facility];  Service: Orthopedics;  Laterality: Left;  . EYE SURGERY     Lasik 2010  . eyelid surgery    . FRACTURE SURGERY    . HYSTEROSCOPY W/D&C  08/17/2011   Procedure: DILATATION AND CURETTAGE (D&C) /HYSTEROSCOPY;  Surgeon: Margarette Asal;  Location: [facility];  Service: Gynecology;  Laterality: N/A;  . INJECTION KNEE Right 07/04/2015   Procedure: CORTISONE INJECTION RIGHT KNEE ;  Surgeon: Gaynelle Arabian, MD;  Location: [facility];  Service: Orthopedics;  Laterality: Right;  . KNEE ARTHROSCOPY     right x 2  . KNEE  RECONSTRUCTION, MEDIAL PATELLAR FEMORAL LIGAMENT     right  . LASIK  2009    bilateral  . left foot surgery     removed foreign object  . OPEN SURGICAL REPAIR OF GLUTEAL TENDON Left 07/04/2015   Procedure: GLUTEAL TENDON REPAIR ;  Surgeon: Gaynelle Arabian, MD;  Location: [facility];  Service: Orthopedics;  Laterality: Left;  . REDUCTION MAMMAPLASTY Bilateral   . thumb surgery    . TOTAL KNEE ARTHROPLASTY Right 06/17/2017   Procedure: RIGHT TOTAL KNEE ARTHROPLASTY;  Surgeon: Gaynelle Arabian, MD;  Location: [facility];  Service: Orthopedics;  Laterality: Right;  . TUBAL LIGATION       Family History  Problem Relation Age of Onset  . Hypertension Mother   . Heart disease Mother   . Stomach cancer Father 60  . Cancer Father        stomach and liver cancer  . Diabetes Brother   . Heart disease Brother   . Hypertension Brother   . Cancer Daughter        breast cancer  . Hypertension Daughter   . Hypertension Brother   . Heart disease Brother   . Thyroid disease Sister   . Breast cancer Sister   . Hypertension Sister   . Psoriasis Sister   . High Cholesterol Sister   . Neuropathy Sister   . Colon cancer Neg Hx      Social History   Socioeconomic History  . Marital status: Married    Spouse name: Not on file  . Number of children: 2  . Years of education: Not on file  . Highest education level: Some college, no degree  Occupational History  . Not on file  Social Needs  . Financial resource strain: Not hard at all  . Food insecurity:    Worry: Never true    Inability: Never true  . Transportation needs:    Medical: No    Non-medical: No  Tobacco Use  . Smoking status: Never Smoker  . Smokeless tobacco: Never Used  . 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 Alcohol use: Yes    Comment: occasional/ 2 drinks a month  . Drug use: No  . Sexual activity: Yes    Birth  control/protection: Post-menopausal, Surgical  Lifestyle  . Physical activity:    Days per week: 0 days    Minutes per session: 0 min  . Stress: Rather much  Relationships  . Social connections:    Talks on phone: More than three times a week    Gets together: More than three times a week    Attends religious service: Never    Active member of club or organization:  No    Attends meetings of clubs or organizations: Never    Relationship status: Married  . Intimate partner violence:    Fear of current or ex partner: No    Emotionally abused: No    Physically abused: No    Forced sexual activity: No  Other Topics Concern  . Not on file  Social History Narrative   Lives at home with husband.  Retired.  Education some college.  2 children.  Caffeine 3-4 glasses of tea/ day.     Allergies  Allergen Reactions  . Nsaids Other (See Comments)    Hurts stomach     Prior to Admission medications   Medication Sig Start Date End Date Taking? Authorizing Provider  acetaminophen (TYLENOL) 500 MG tablet Take 1,000 mg by mouth every 8 (eight) hours as needed for moderate pain or headache.    Yes [provider]  atorvastatin (LIPITOR) 20 MG tablet Take 1 tablet (20 mg total) by mouth every morning. 10/02/17  Yes Stallings, Zoe A, MD  buPROPion (WELLBUTRIN XL) 300 MG 24 hr tablet Take 1 tablet (300 mg total) by mouth daily. 10/02/17  Yes Stallings, Arlie Solomons, MD  Calcium-Phosphorus-Vitamin D (CITRACAL +D3 PO) Take 1,000 Units by mouth daily.    Yes [provider]  cholecalciferol (VITAMIN D) 1000 units tablet Take 1,000 Units by mouth daily.   Yes [provider]  ciprofloxacin (CIPRO) 500 MG tablet Take 1 tablet (500 mg total) by mouth 2 (two) times daily. 05/21/18  Yes Forrest Moron, MD  Cyanocobalamin (VITAMIN B12) 1000 MCG TBCR Take 1,000 mcg by mouth daily.   Yes [provider]  cyclobenzaprine (FLEXERIL) 10 MG tablet Take 1 tablet (10 mg total) by mouth  3 (three) times daily as needed for muscle spasms (not relieved by Robaxin). 02/15/18  Yes Costella, Vista Mink, PA-C  diclofenac sodium (VOLTAREN) 1 % GEL Apply 1 g topically daily as needed.    Yes [provider]  diphenoxylate-atropine (LOMOTIL) 2.5-0.025 MG per tablet Take 1 tablet by mouth daily.    Yes [provider]  fluorouracil (EFUDEX) 5 % cream Apply 1 application topically See admin instructions. In January applies daily for 3 weeks then off for 1-3 weeks then resume for 3 weeks. "For skin cancers" 05/31/15  Yes [provider]  furosemide (LASIX) 40 MG tablet Take 1 tablet (40 mg total) by mouth daily. 10/02/17  Yes Forrest Moron, MD  HYDROcodone-acetaminophen (NORCO) 10-325 MG tablet Take 1 tablet by mouth every 4 (four) hours as needed for moderate pain ((score 4 to 6)). 02/15/18  Yes Costella, Vista Mink, PA-C  Liniments (SALONPAS PAIN RELIEF PATCH EX) Apply 2 patches topically daily.   Yes [provider]  loratadine (CLARITIN) 10 MG tablet Take 10 mg by mouth daily.    Yes [provider]  Magnesium 300 MG CAPS Take 300 mg by mouth daily.    Yes [provider]  niacinamide 500 MG tablet Take 500 mg by mouth 2 (two) times daily with a meal.   Yes [provider]  promethazine (PHENERGAN) 12.5 MG tablet Take 0.5-1 tablets (6.25-12.5 mg total) by mouth every 6 (six) hours as needed for nausea (migraines). 10/02/17  Yes Stallings, Zoe A, MD  Simethicone (PHAZYME PO) Take 1 tablet by mouth every morning.    Yes [provider]     Depression screen [facility] 2/9 09/24/2018 09/23/2017 09/23/2017 11/26/2016 09/19/2016  Decreased Interest 0 0 0 0 0  Down, Depressed, Hopeless 0 0 0 0 0  PHQ - 2 Score 0 0 0 0 0  Altered sleeping 0 - 3 - -  Tired, decreased energy 0 - 2 - -  Change in appetite 0 - 0 - -  Feeling bad or failure about yourself  - - 0 - -  Trouble concentrating 0 - 3 - -  Moving slowly or fidgety/restless 0 - 0 - -   Suicidal thoughts 0 - 0 - -  PHQ-9 Score 0 - 8 - -  Difficult doing work/chores - - Somewhat difficult - -     Fall Risk  09/24/2018 09/24/2018 09/23/2017 09/23/2017 11/26/2016  Falls in the past year? 0 0 No No No  Number falls in past yr: 0 - - - -  Injury with Fall? 0 0 - - -      PHYSICAL EXAM: BP (!) 146/78 (BP Location: Right Arm, Patient Position: Sitting, Cuff Size: Normal)    Wt Readings from Last 3 Encounters:  01/31/18 181 lb 8 oz (82.3 kg)  01/20/18 181 lb 9.6 oz (82.4 kg)  09/23/17 181 lb 12.8 oz (82.5 kg)     No exam data present    Physical Exam   Education/Counseling provided regarding diet and exercise, prevention of chronic diseases, smoking/tobacco cessation, if applicable, and reviewed "Covered Medicare Preventive Services."   ASSESSMENT/PLAN:  Will book Mammogram and Dexa Scan through OBGYN  Will go to [employer] for Shingrix  Thank you for taking time to come for your Medicare Wellness Visit. I appreciate your ongoing commitment to your health goals. Please review the following plan we discussed and let me know if I can assist you in the future.  Leroy Kennedy LPN

## 2018-10-06 ENCOUNTER — Ambulatory Visit
Admission: RE | Admit: 2018-10-06 | Discharge: 2018-10-06 | Disposition: A | Discharge status: Home / Self Care | Payer: Medicare Other | Source: Ambulatory Visit | Attending: Neurological Surgery | Admitting: Neurological Surgery

## 2018-10-06 DIAGNOSIS — H5203 Hypermetropia, bilateral: Secondary | ICD-10-CM | POA: Diagnosis not present

## 2018-10-06 DIAGNOSIS — M47812 Spondylosis without myelopathy or radiculopathy, cervical region: Secondary | ICD-10-CM | POA: Diagnosis not present

## 2018-10-06 DIAGNOSIS — M5023 Other cervical disc displacement, cervicothoracic region: Secondary | ICD-10-CM | POA: Diagnosis not present

## 2018-10-06 DIAGNOSIS — H2513 Age-related nuclear cataract, bilateral: Secondary | ICD-10-CM | POA: Diagnosis not present

## 2018-10-06 DIAGNOSIS — M542 Cervicalgia: Secondary | ICD-10-CM

## 2018-10-13 ENCOUNTER — Ambulatory Visit (INDEPENDENT_AMBULATORY_CARE_PROVIDER_SITE_OTHER): Payer: Medicare Other | Admitting: Family Medicine

## 2018-10-13 DIAGNOSIS — I10 Essential (primary) hypertension: Secondary | ICD-10-CM

## 2018-10-13 DIAGNOSIS — E782 Mixed hyperlipidemia: Secondary | ICD-10-CM | POA: Diagnosis not present

## 2018-10-13 DIAGNOSIS — Z8349 Family history of other endocrine, nutritional and metabolic diseases: Secondary | ICD-10-CM | POA: Diagnosis not present

## 2018-10-13 DIAGNOSIS — R9389 Abnormal findings on diagnostic imaging of other specified body structures: Secondary | ICD-10-CM

## 2018-10-13 NOTE — Progress Notes (Signed)
Nurse visit only

## 2018-10-14 DIAGNOSIS — M542 Cervicalgia: Secondary | ICD-10-CM | POA: Diagnosis not present

## 2018-10-14 DIAGNOSIS — I10 Essential (primary) hypertension: Secondary | ICD-10-CM | POA: Diagnosis not present

## 2018-10-14 DIAGNOSIS — Z6827 Body mass index (BMI) 27.0-27.9, adult: Secondary | ICD-10-CM | POA: Diagnosis not present

## 2018-10-14 LAB — COMPREHENSIVE METABOLIC PANEL
ALBUMIN: 4.6 g/dL (ref 3.8–4.8)
ALK PHOS: 99 IU/L (ref 39–117)
ALT: 18 IU/L (ref 0–32)
AST: 25 IU/L (ref 0–40)
Albumin/Globulin Ratio: 2.2 (ref 1.2–2.2)
BILIRUBIN TOTAL: 0.7 mg/dL (ref 0.0–1.2)
BUN / CREAT RATIO: 15 (ref 12–28)
BUN: 15 mg/dL (ref 8–27)
CHLORIDE: 99 mmol/L (ref 96–106)
CO2: 18 mmol/L — AB (ref 20–29)
CREATININE: 1.01 mg/dL — AB (ref 0.57–1.00)
Calcium: 9.7 mg/dL (ref 8.7–10.3)
GFR calc Af Amer: 66 mL/min/{1.73_m2} (ref >59)
GFR calc non Af Amer: 57 mL/min/{1.73_m2} — ABNORMAL LOW (ref >59)
GLUCOSE: 99 mg/dL (ref 65–99)
Globulin, Total: 2.1 g/dL (ref 1.5–4.5)
Potassium: 4.5 mmol/L (ref 3.5–5.2)
Sodium: 139 mmol/L (ref 134–144)
Total Protein: 6.7 g/dL (ref 6.0–8.5)

## 2018-10-14 LAB — LIPID PANEL
CHOL/HDL RATIO: 3.2 ratio (ref 0.0–4.4)
Cholesterol, Total: 175 mg/dL (ref 100–199)
HDL: 54 mg/dL (ref >39)
LDL CALC: 86 mg/dL (ref 0–99)
Triglycerides: 177 mg/dL — ABNORMAL HIGH (ref 0–149)
VLDL CHOLESTEROL CAL: 35 mg/dL (ref 5–40)

## 2018-10-14 LAB — TSH: TSH: 1.94 u[IU]/mL (ref 0.450–4.500)

## 2018-10-17 ENCOUNTER — Other Ambulatory Visit: Payer: Self-pay

## 2018-10-17 ENCOUNTER — Encounter: Payer: Self-pay | Admitting: Family Medicine

## 2018-10-17 ENCOUNTER — Ambulatory Visit (INDEPENDENT_AMBULATORY_CARE_PROVIDER_SITE_OTHER): Payer: Medicare Other | Admitting: Family Medicine

## 2018-10-17 ENCOUNTER — Telehealth: Payer: Self-pay | Admitting: Family Medicine

## 2018-10-17 VITALS — BP 122/70 | HR 76 | Temp 97.6°F | Resp 17 | Ht 67.5 in | Wt 174.8 lb

## 2018-10-17 DIAGNOSIS — F5101 Primary insomnia: Secondary | ICD-10-CM | POA: Diagnosis not present

## 2018-10-17 DIAGNOSIS — Z760 Encounter for issue of repeat prescription: Secondary | ICD-10-CM | POA: Diagnosis not present

## 2018-10-17 DIAGNOSIS — E782 Mixed hyperlipidemia: Secondary | ICD-10-CM

## 2018-10-17 DIAGNOSIS — F40241 Acrophobia: Secondary | ICD-10-CM | POA: Diagnosis not present

## 2018-10-17 DIAGNOSIS — I10 Essential (primary) hypertension: Secondary | ICD-10-CM | POA: Diagnosis not present

## 2018-10-17 DIAGNOSIS — E2839 Other primary ovarian failure: Secondary | ICD-10-CM

## 2018-10-17 DIAGNOSIS — F40240 Claustrophobia: Secondary | ICD-10-CM | POA: Diagnosis not present

## 2018-10-17 MED ORDER — SILVER SULFADIAZINE 1 % EX CREA: 1.0000 "application " | TOPICAL_CREAM | Freq: Every day | CUTANEOUS | 0 refills | Status: DC

## 2018-10-17 MED ORDER — PROMETHAZINE HCL 12.5 MG PO TABS: 6.2500 mg | ORAL_TABLET | Freq: Four times a day (QID) | ORAL | 0 refills | Status: DC | PRN

## 2018-10-17 MED ORDER — DIPHENOXYLATE-ATROPINE 2.5-0.025 MG PO TABS: 1.0000 | ORAL_TABLET | Freq: Every day | ORAL | 1 refills | Status: DC

## 2018-10-17 NOTE — Progress Notes (Signed)
Established Patient Office Visit  Subjective:  Patient ID: [name], female    DOB: 30-Oct-1950  Age: 68 y.o. MRN: [id number]  CC:  Chief Complaint  Patient presents with  . Annual Exam    cpe  . Medication Management    HPI [name] presents for medication management.  She just had her annual wellness exam and was expecting a physical.   Hypertension: Patient here for follow-up of elevated blood pressure. She is exercising and is adherent to low salt diet.  Blood pressure is well controlled at home. Cardiac symptoms none. Patient denies chest pain, chest pressure/discomfort, dyspnea, fatigue, irregular heart beat and lower extremity edema.  Cardiovascular risk factors: dyslipidemia and hypertension. Use of agents associated with hypertension: none. History of target organ damage: none. BP Readings from Last 3 Encounters:  10/17/18 122/70  09/24/18 (!) 146/78  02/15/18 116/60    Dyslipidemia: Patient presents for evaluation of lipids.  Compliance with treatment thus far has been excellent.  A repeat fasting lipid profile was done.  The patient does not use medications that may worsen dyslipidemias (corticosteroids, progestins, anabolic steroids, diuretics, beta-blockers, amiodarone, cyclosporine, olanzapine). The patient exercises weekly.  The patient is not known to have coexisting coronary artery disease.     Lab Results  Component Value Date   CHOL 175 10/13/2018   CHOL 186 09/23/2017   CHOL 176 09/19/2016   Lab Results  Component Value Date   HDL 54 10/13/2018   HDL 69 09/23/2017   HDL 66 09/19/2016   Lab Results  Component Value Date   LDLCALC 86 10/13/2018   LDLCALC 59 09/23/2017   LDLCALC 81 09/19/2016   Lab Results  Component Value Date   TRIG 177 (H) 10/13/2018   TRIG 290 (H) 09/23/2017   TRIG 144 09/19/2016   Lab Results  Component Value Date   CHOLHDL 3.2 10/13/2018   CHOLHDL 2.7 09/23/2017   CHOLHDL 2.7 09/19/2016   No results  found for: LDLDIRECT  Phobia Fear of heights and claustrophobia She gets very fearful of claustrophobia, and heights She uses her dog as a support animal She gets very distressed   Insomnia  She is currently trying sleepy time tea but it is not working She also tries aromatherapy She gets about 6-7 hours of sleep per night and feels like she could use another hour She reports that a previous doctor told her she had to get 8 hours of sleep  Past Medical History:  Diagnosis Date  . Arthritis    knees, hands  . Bursitis of left hip   . Cancer ([place])    hx skin cancer  . Depression   . GERD (gastroesophageal reflux disease)    INFREQUENT  . Headache    HX MIGRAINES   . Heart murmur     MVP 30 yrs ago - no treatment needed  . Hyperlipidemia    on statin  . Hypertension    "flucuations" has never been on any med  . Insomnia   . Irritable bowel syndrome    s/p subtotal colectomy '86>diarrhea prone  . NEOPLASM, MALIGNANT, BASAL CELL, CARCINOMA, HX OF   . Seasonal allergies     Past Surgical History:  Procedure Laterality Date  . ABDOMINOPLASTY    . ANTERIOR CERVICAL DECOMP/DISCECTOMY FUSION N/A 02/14/2018   Procedure: CERVICAL THREE-FOUR,CERVICAL FOUR-FIVE ACDF, REMOVAL CERVICAL FIVE-SEVEN  PLATE.;  Surgeon: Eustace Moore, MD;  Location: [facility];  Service: Neurosurgery;  Laterality: N/A;  anterior  .  [place]   lower back  . BREAST ENHANCEMENT SURGERY  1983  . BREAST IMPLANT REMOVAL  08/2010  . BREAST SURGERY     Augmentation in 1983, Reduction 2011  . BUNIONECTOMY     right  and left foot  . CERVICAL SPINE SURGERY  12/18/1999   C5/6 and C6/7  . COLECTOMY  1986  . COSMETIC SURGERY    . EXCISION/RELEASE BURSA HIP Left 07/04/2015   Procedure: LEFT HIP BURSECTOMY WITH ;  Surgeon: Gaynelle Arabian, MD;  Location: [facility];  Service: Orthopedics;  Laterality: Left;  . EYE SURGERY     Lasik 2010  . eyelid surgery    . FRACTURE SURGERY    . HYSTEROSCOPY W/D&C  08/17/2011    Procedure: DILATATION AND CURETTAGE (D&C) /HYSTEROSCOPY;  Surgeon: Margarette Asal;  Location: [facility];  Service: Gynecology;  Laterality: N/A;  . INJECTION KNEE Right 07/04/2015   Procedure: CORTISONE INJECTION RIGHT KNEE ;  Surgeon: Gaynelle Arabian, MD;  Location: [facility];  Service: Orthopedics;  Laterality: Right;  . KNEE ARTHROSCOPY     right x 2  . KNEE RECONSTRUCTION, MEDIAL PATELLAR FEMORAL LIGAMENT     right  . LASIK  2009    bilateral  . left foot surgery     removed foreign object  . OPEN SURGICAL REPAIR OF GLUTEAL TENDON Left 07/04/2015   Procedure: GLUTEAL TENDON REPAIR ;  Surgeon: Gaynelle Arabian, MD;  Location: [facility];  Service: Orthopedics;  Laterality: Left;  . REDUCTION MAMMAPLASTY Bilateral   . thumb surgery    . TOTAL KNEE ARTHROPLASTY Right 06/17/2017   Procedure: RIGHT TOTAL KNEE ARTHROPLASTY;  Surgeon: Gaynelle Arabian, MD;  Location: [facility];  Service: Orthopedics;  Laterality: Right;  . TUBAL LIGATION      Family History  Problem Relation Age of Onset  . Hypertension Mother   . Heart disease Mother   . Stomach cancer Father 43  . Cancer Father        stomach and liver cancer  . Diabetes Brother   . Heart disease Brother   . Hypertension Brother   . Cancer Daughter        breast cancer  . Hypertension Daughter   . Hypertension Brother   . Heart disease Brother   . Thyroid disease Sister   . Breast cancer Sister   . Hypertension Sister   . Psoriasis Sister   . High Cholesterol Sister   . Neuropathy Sister   . Colon cancer Neg Hx     Social History   Socioeconomic History  . Marital status: Married    Spouse name: Not on file  . Number of children: 2  . Years of education: Not on file  . Highest education level: Some college, no degree  Occupational History  . Not on file  Social Needs  . Financial resource strain: Not hard at all  . Food insecurity:    Worry: Never true    Inability: Never true  . Transportation needs:    Medical: No     Non-medical: No  Tobacco Use  . Smoking status: Never Smoker  . Smokeless tobacco: Never Used  . 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 Alcohol use: Yes    Comment: occasional/ 2 drinks a month  . Drug use: No  . Sexual activity: Yes    Birth control/protection:  Post-menopausal, Surgical  Lifestyle  . Physical activity:    Days per week: 0 days    Minutes per session: 0 min  . Stress: Rather much  Relationships  . Social connections:    Talks on phone: More than three times a week    Gets together: More than three times a week    Attends religious service: Never    Active member of club or organization: No    Attends meetings of clubs or organizations: Never    Relationship status: Married  . Intimate partner violence:    Fear of current or ex partner: No    Emotionally abused: No    Physically abused: No    Forced sexual activity: No  Other Topics Concern  . Not on file  Social History Narrative   Lives at home with husband.  Retired.  Education some college.  2 children.  Caffeine 3-4 glasses of tea/ day.    Outpatient Medications Prior to Visit  Medication Sig Dispense Refill  . acetaminophen (TYLENOL) 500 MG tablet Take 1,000 mg by mouth every 8 (eight) hours as needed for moderate pain or headache.     Marland Kitchen atorvastatin (LIPITOR) 20 MG tablet Take 1 tablet (20 mg total) by mouth every morning. 90 tablet 0  . buPROPion (WELLBUTRIN XL) 300 MG 24 hr tablet Take 1 tablet (300 mg total) by mouth daily. 90 tablet 0  . Calcium-Phosphorus-Vitamin D (CITRACAL +D3 PO) Take 1,000 Units by mouth daily.     . cholecalciferol (VITAMIN D) 1000 units tablet Take 1,000 Units by mouth daily.    . ciprofloxacin (CIPRO) 500 MG tablet Take 1 tablet (500 mg total) by mouth 2 (two) times daily. 6 tablet 1  . Cyanocobalamin (VITAMIN B12) 1000 MCG TBCR Take 1,000 mcg by mouth daily.      . cyclobenzaprine (FLEXERIL) 10 MG tablet Take 1 tablet (10 mg total) by mouth 3 (three) times daily as needed for muscle spasms (not relieved by Robaxin). 90 tablet 2  . diclofenac sodium (VOLTAREN) 1 % GEL Apply 1 g topically daily as needed.     . fluorouracil (EFUDEX) 5 % cream Apply 1 application topically See admin instructions. In January applies daily for 3 weeks then off for 1-3 weeks then resume for 3 weeks. "For skin cancers"  0  . furosemide (LASIX) 40 MG tablet Take 1 tablet (40 mg total) by mouth daily. 90 tablet 0  . HYDROcodone-acetaminophen (NORCO) 10-325 MG tablet Take 1 tablet by mouth every 4 (four) hours as needed for moderate pain ((score 4 to 6)). 60 tablet 0  . Liniments (SALONPAS PAIN RELIEF PATCH EX) Apply 2 patches topically daily.    Marland Kitchen loratadine (CLARITIN) 10 MG tablet Take 10 mg by mouth daily.     . Magnesium 300 MG CAPS Take 300 mg by mouth daily.     . niacinamide 500 MG tablet Take 500 mg by mouth 2 (two) times daily with a meal.    . Simethicone (PHAZYME PO) Take 1 tablet by mouth every morning.     . diphenoxylate-atropine (LOMOTIL) 2.5-0.025 MG tablet Take 1 tablet by mouth daily. 90 tablet 1  . promethazine (PHENERGAN) 12.5 MG tablet Take 0.5-1 tablets (6.25-12.5 mg total) by mouth every 6 (six) hours as needed for nausea (migraines). 30 tablet 0   No facility-administered medications prior to visit.     Allergies  Allergen Reactions  . Nsaids Other (See Comments)    Hurts stomach  ROS Review of Systems Review of Systems  Constitutional: Negative for activity change, appetite change, chills and fever.  HENT: Negative for congestion, nosebleeds, trouble swallowing and voice change.   Respiratory: Negative for cough, shortness of breath and wheezing.   Gastrointestinal: Negative for diarrhea, nausea and vomiting. +loose stools  Genitourinary: Negative for difficulty urinating, dysuria, flank pain and hematuria.  Musculoskeletal: Negative for back  pain, joint swelling and neck pain.  Neurological: Negative for dizziness, speech difficulty, light-headedness and numbness.  See HPI. All other review of systems negative.     Objective:    Physical Exam  BP 122/70 (BP Location: Left Arm, Patient Position: Sitting, Cuff Size: Normal)   Pulse 76   Temp 97.6 F (36.4 C) (Oral)   Resp 17   Ht 5' 7.5" (1.715 m)   Wt 174 lb 12.8 oz (79.3 kg)   SpO2 97%   BMI 26.97 kg/m  Wt Readings from Last 3 Encounters:  10/17/18 174 lb 12.8 oz (79.3 kg)  01/31/18 181 lb 8 oz (82.3 kg)  01/20/18 181 lb 9.6 oz (82.4 kg)   Depression screen [facility] 2/9 10/17/2018 09/24/2018 09/23/2017 09/23/2017 11/26/2016  Decreased Interest 1 0 0 0 0  Down, Depressed, Hopeless 1 0 0 0 0  PHQ - 2 Score 2 0 0 0 0  Altered sleeping 3 0 - 3 -  Tired, decreased energy 0 0 - 2 -  Change in appetite 0 0 - 0 -  Feeling bad or failure about yourself  0 - - 0 -  Trouble concentrating 0 0 - 3 -  Moving slowly or fidgety/restless 0 0 - 0 -  Suicidal thoughts 0 0 - 0 -  PHQ-9 Score 5 0 - 8 -  Difficult doing work/chores Somewhat difficult - - Somewhat difficult -    Physical Exam  Constitutional: Oriented to person, place, and time. Appears well-developed and well-nourished.  HENT:  Head: Normocephalic and atraumatic.  Eyes: Conjunctivae and EOM are normal.  Cardiovascular: Normal rate, regular rhythm, normal heart sounds and intact distal pulses.  No murmur heard. Breast exam: numerous scars from previous surgery, palpable scar tissue, no masses, normal skin, breast symmetric, no axillary or clavicular LAD Pulmonary/Chest: Effort normal and breath sounds normal. No stridor. No respiratory distress. Has no wheezes.  Abdomen: nondistended, normoactive bs, soft, nontender Neurological: Is alert and oriented to person, place, and time.  Skin: Skin is warm. Capillary refill takes less than 2 seconds.  Psychiatric: Has a normal mood and affect. Behavior is normal. Judgment and  thought content normal.   Health Maintenance Due  Topic Date Due  . DEXA SCAN  06/01/2015    There are no preventive care reminders to display for this patient.  Lab Results  Component Value Date   TSH 1.940 10/13/2018   Lab Results  Component Value Date   WBC 7.1 01/31/2018   HGB 15.8 (H) 01/31/2018   HCT 48.5 (H) 01/31/2018   MCV 92.2 01/31/2018   PLT 275 01/31/2018   Lab Results  Component Value Date   NA 139 10/13/2018   K 4.5 10/13/2018   CO2 18 (L) 10/13/2018   GLUCOSE 99 10/13/2018   BUN 15 10/13/2018   CREATININE 1.01 (H) 10/13/2018   BILITOT 0.7 10/13/2018   ALKPHOS 99 10/13/2018   AST 25 10/13/2018   ALT 18 10/13/2018   PROT 6.7 10/13/2018   ALBUMIN 4.6 10/13/2018   CALCIUM 9.7 10/13/2018   ANIONGAP 12 01/31/2018   GFR 66.90  11/17/2014   Lab Results  Component Value Date   CHOL 175 10/13/2018   Lab Results  Component Value Date   HDL 54 10/13/2018   Lab Results  Component Value Date   LDLCALC 86 10/13/2018   Lab Results  Component Value Date   TRIG 177 (H) 10/13/2018   Lab Results  Component Value Date   CHOLHDL 3.2 10/13/2018   Lab Results  Component Value Date   HGBA1C 5.7 05/31/2009      Assessment & Plan:   Problem List Items Addressed This Visit      Cardiovascular and Mediastinum   Essential hypertension - Patient's blood pressure is at goal of 139/89 or less. Condition is stable. Continue current medications and treatment plan. I recommend that you exercise for 30-45 minutes 5 days a week. I also recommend a balanced diet with fruits and vegetables every day, lean meats, and little fried foods. The DASH diet (you can find this online) is a good example of this.    Relevant Orders   Lipid panel   CMP14+EGFR     Other   Hyperlipidemia - Primary  Discussed medications that affect lipids Reminded patient to avoid grapefruits Reviewed last 3 lipids Discussed current meds: statin, aspirin Advised dietary fiber and fish oil  and ways to keep HDL high CAD prevention and reviewed side effects of statins     Relevant Orders   Lipid panel   CMP14+EGFR    Other Visit Diagnoses    Estrogen deficiency    -  Bone density followed by Gynecology Will get last result for records Dr. Matthew [facility]   Claustrophobia    -  Will provide a medical necessity letter for claustrophobia and fear of heights   Fear of heights        Insomnia - will try sleepy time tea, melatonin Gave a trial of trazodone at the lowest dose after discussion of risks and side effects Medication refills-  Refilled phenergan for her migraines  Meds ordered this encounter  Medications  . promethazine (PHENERGAN) 12.5 MG tablet    Sig: Take 0.5-1 tablets (6.25-12.5 mg total) by mouth every 6 (six) hours as needed for nausea (migraines).    Dispense:  30 tablet    Refill:  0  . DISCONTD: diphenoxylate-atropine (LOMOTIL) 2.5-0.025 MG tablet    Sig: Take 1 tablet by mouth daily.    Dispense:  90 tablet    Refill:  1  . silver sulfADIAZINE (SILVADENE) 1 % cream    Sig: Apply 1 application topically daily. Use enough to cover would with a light layer    Dispense:  50 g    Refill:  0  . diphenoxylate-atropine (LOMOTIL) 2.5-0.025 MG tablet    Sig: Take 1 tablet by mouth daily.    Dispense:  90 tablet    Refill:  1    Follow-up: Return in about 6 months (around 04/17/2019) for cholesterol and blood pressure check .    Forrest Moron, MD

## 2018-10-17 NOTE — Telephone Encounter (Signed)
Pt has dropped off paperwork concerning emotional support . I have placed in [facility]'s medication box up front. Please advise at [phone]

## 2018-10-17 NOTE — Patient Instructions (Addendum)
We recommend that you schedule a mammogram for breast cancer screening. Typically, you do not need a referral to do this. Please contact a local imaging center to schedule your mammogram.  [facility] - [phone]  *ask for the Radiology Department [facility]) - [phone] or [phone]  [facility] - [phone] [facility] - [phone] [facility] - [phone]  *ask for the Radiology Department [facility] - [phone]  *ask for the Radiology Department [facility] - [phone]  *ask for the Mammography Department [facility] - [phone]    If you have lab work done today you will be contacted with your lab results within the next 2 weeks.  If you have not heard from us then please contact us. The fastest way to get your results is to register for My Chart.   IF you received an x-ray today, you will receive an invoice from [facility]. Please contact [facility] at [phone] with questions or concerns regarding your invoice.   IF you received labwork today, you will receive an invoice from [facility]. Please contact [facility] at [phone] with questions or concerns regarding your invoice.   Our billing staff will not be able to assist you with questions regarding bills from these companies.  You will be contacted with the lab results as soon as they are available. The fastest way to get your results is to activate your My Chart account. Instructions are located on the last page of this paperwork. If you have not heard from us regarding the results in 2 weeks, please contact this office.     

## 2018-10-18 MED ORDER — TRAZODONE HCL 50 MG PO TABS: 25.0000 mg | ORAL_TABLET | Freq: Every evening | ORAL | 3 refills | Status: DC | PRN

## 2018-10-18 MED ORDER — DIPHENOXYLATE-ATROPINE 2.5-0.025 MG PO TABS: 1.0000 | ORAL_TABLET | Freq: Every day | ORAL | 1 refills | Status: DC

## 2018-10-22 NOTE — Telephone Encounter (Signed)
Pt work completed and pt called to pickup.  Pt p/u forms on yesterday.  Copies made an placed in scan box for scanning. [place]

## 2018-11-04 DIAGNOSIS — M65321 Trigger finger, right index finger: Secondary | ICD-10-CM | POA: Diagnosis not present

## 2018-11-07 ENCOUNTER — Other Ambulatory Visit: Payer: Self-pay | Admitting: Family Medicine

## 2018-11-07 DIAGNOSIS — Z1231 Encounter for screening mammogram for malignant neoplasm of breast: Secondary | ICD-10-CM

## 2018-12-05 ENCOUNTER — Ambulatory Visit: Payer: Medicare Other

## 2018-12-08 ENCOUNTER — Other Ambulatory Visit: Payer: Self-pay | Admitting: Family Medicine

## 2018-12-11 DIAGNOSIS — M65321 Trigger finger, right index finger: Secondary | ICD-10-CM | POA: Diagnosis not present

## 2019-01-16 DIAGNOSIS — R490 Dysphonia: Secondary | ICD-10-CM | POA: Insufficient documentation

## 2019-01-16 DIAGNOSIS — Z8739 Personal history of other diseases of the musculoskeletal system and connective tissue: Secondary | ICD-10-CM | POA: Diagnosis not present

## 2019-01-19 ENCOUNTER — Ambulatory Visit: Payer: Medicare Other

## 2019-02-13 IMAGING — MR MR ORBITS WO/W CM
15 of 18 series · 33 of 48 positions shown · IV contrast (multihance)
Comparison: None.

CLINICAL DATA: History of double vision with intermittent pain,
left worse than right.

Creatinine was obtained on site at [HOSPITAL] at [HOSPITAL].
Results: Creatinine 0.9 mg/dL.
EXAM:
MRI HEAD AND ORBITS WITHOUT AND WITH CONTRAST
TECHNIQUE: Multiplanar, multiecho pulse sequences of the brain and surrounding
structures were obtained without and with intravenous contrast.
Multiplanar, multiecho pulse sequences of the orbits and surrounding
structures were obtained including fat saturation techniques, before
and after intravenous contrast administration.
CONTRAST:  15mL MULTIHANCE GADOBENATE DIMEGLUMINE 529 MG/ML IV SOLN

[Series 6: T1 · sagittal · 4.0mm · 0.75mm/px · 1 of 27 slices shown (1 of 4)]
[im 1/27]
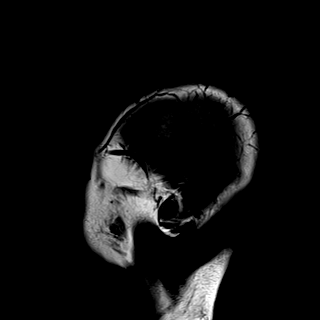

[Series 7: DWI · axial · 3.0mm · 1.44mm/px · z∈[-78,+59]mm · 3 of 80 slices shown (1 of 4)]
[im 1/80]
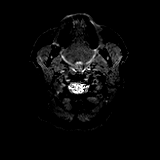
[im 40/80]
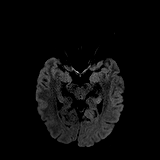
[im 80/80]
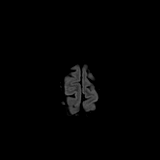

[Series 8: DWI · axial · 3.0mm · 1.44mm/px · z∈[-78,+59]mm · 2 of 38 slices shown (2 of 4)]
[im 1/38]
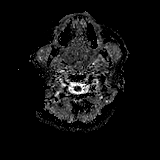
[im 38/38]
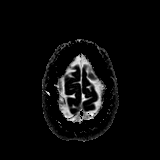

[Series 9: DWI · coronal · 5.0mm · 1.44mm/px · 3 of 60 slices shown (3 of 4)]
[im 1/60]
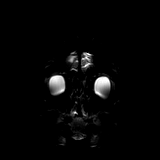
[im 30/60]
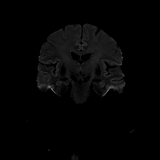
[im 60/60]
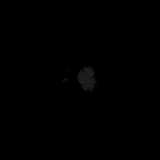

[Series 10: DWI · coronal · 5.0mm · 1.44mm/px · 2 of 30 slices shown (4 of 4)]
[im 1/30]
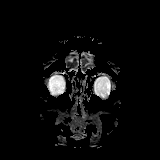
[im 30/30]
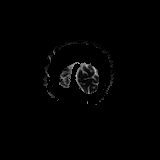

[Series 11: T2 · axial · 4.0mm · 0.36mm/px · 1 of 27 slices shown]
[im 1/27]
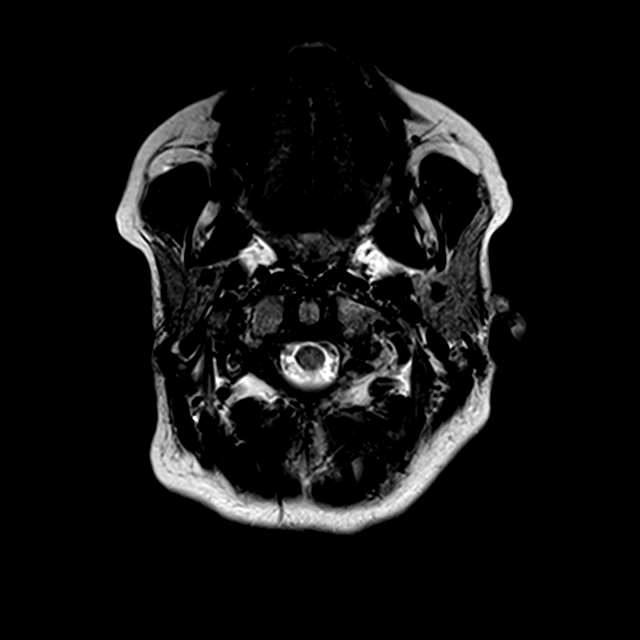

[Series 12: FLAIR · axial · 4.0mm · 0.72mm/px · 1 of 27 slices shown]
[im 1/27]
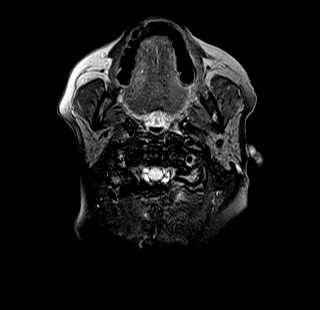

[Series 15: T1 · axial · 1.0mm · 0.90mm/px · z∈[-79,+61]mm · 8 of 144 slices shown (2 of 4)]
[im 1/144]
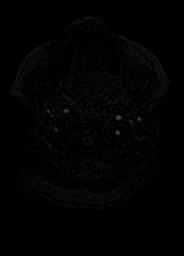
[im 21/144]
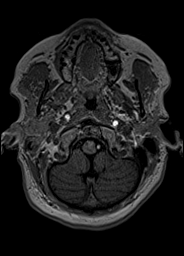
[im 41/144]
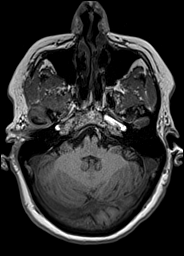
[im 62/144]
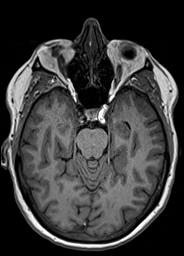
[im 82/144]
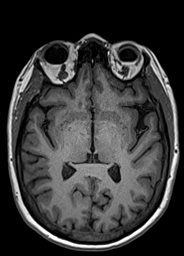
[im 103/144]
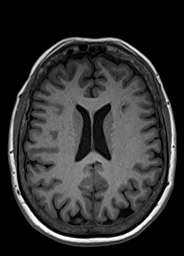
[im 123/144]
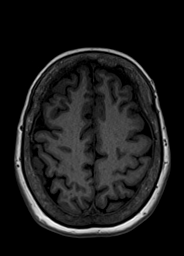
[im 144/144]
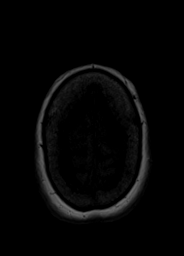

[Series 16: T2 fat-sat · axial · 2.5mm · 0.25mm/px · z∈[-41,+38]mm · 2 of 30 slices shown (1 of 2)]
[im 1/30]
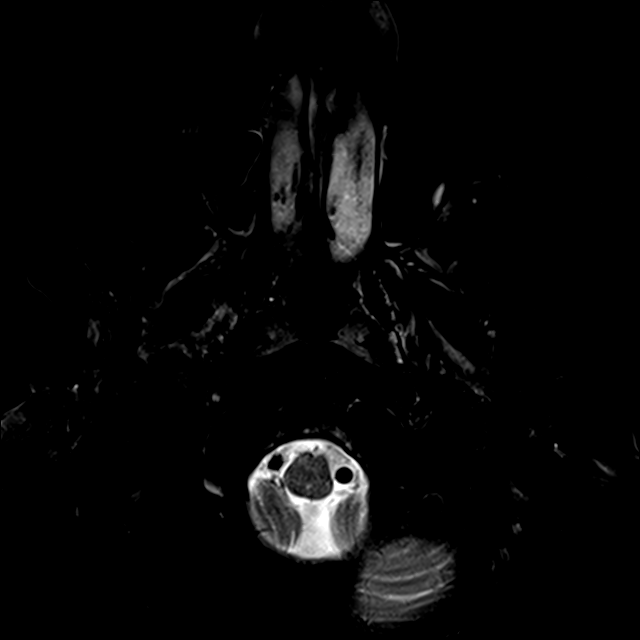
[im 30/30]
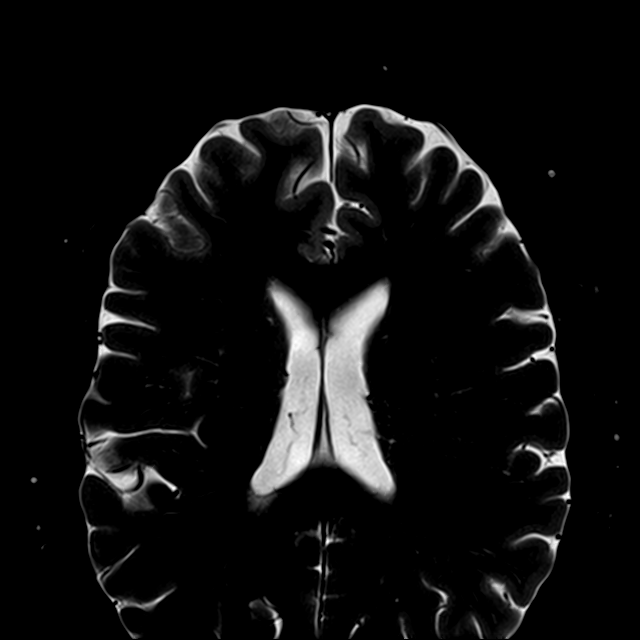

[Series 17: T2 fat-sat · coronal · 2.5mm · 0.25mm/px · 2 of 34 slices shown (2 of 2)]
[im 1/34]
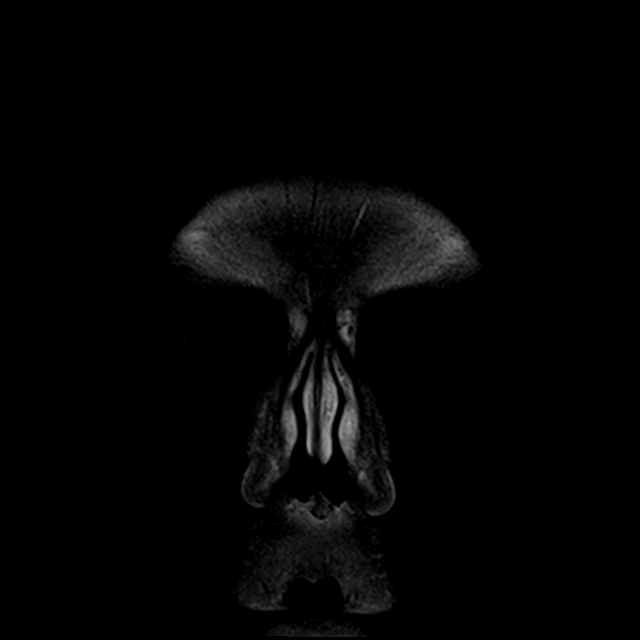
[im 34/34]
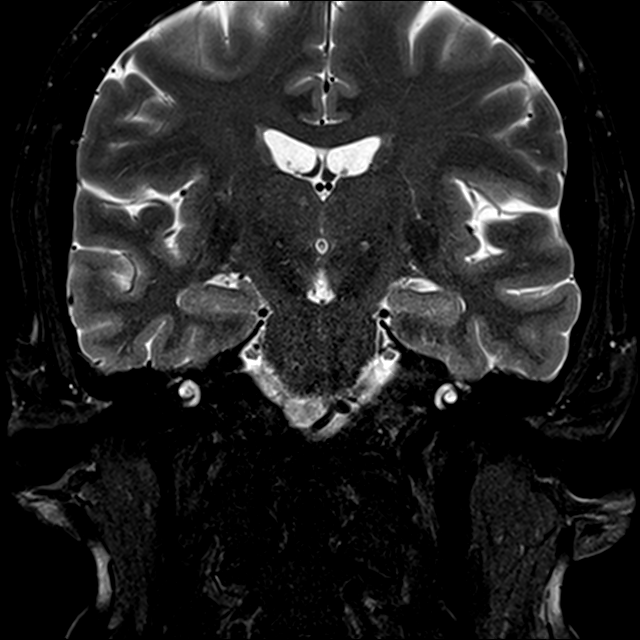

[Series 18: T1 · axial · 2.5mm · 0.25mm/px · 1 of 19 slices shown (3 of 4)]
[im 1/19]
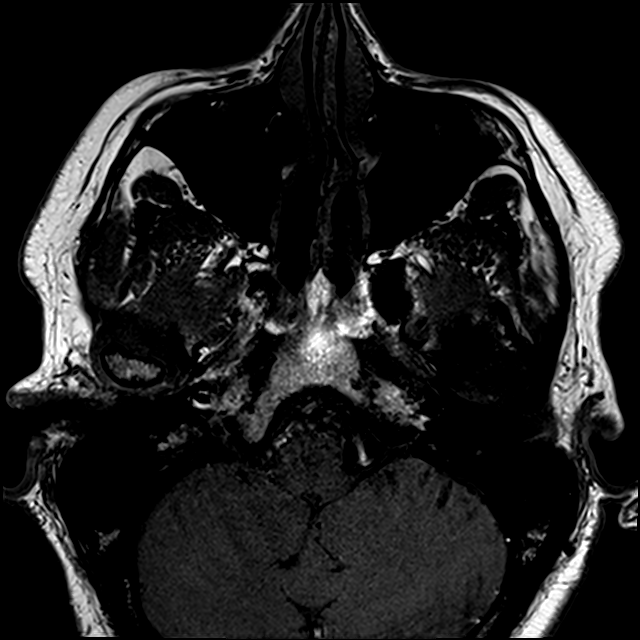

[Series 19: T1 · coronal · 2.5mm · 0.25mm/px · 1 of 34 slices shown (4 of 4)]
[im 1/34]
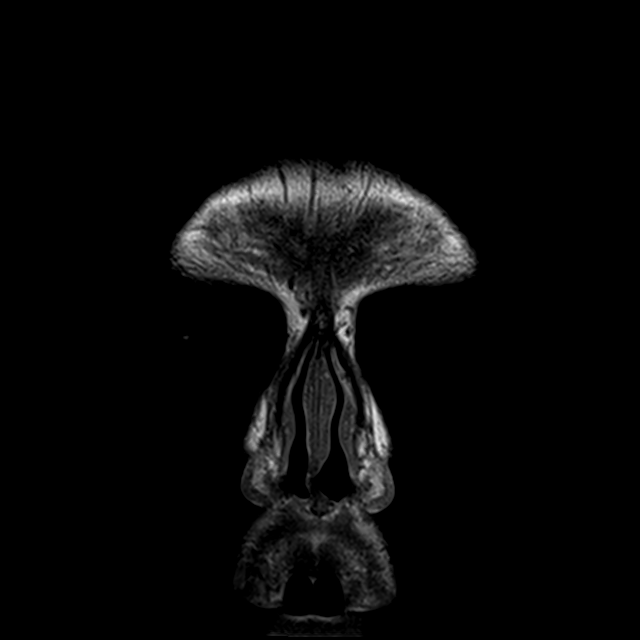

[Series 21: T1 post-contrast · coronal · 2.5mm · 0.50mm/px · 2 of 34 slices shown (1 of 2)]
[im 1/34]
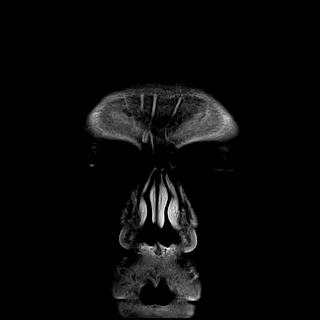
[im 34/34]
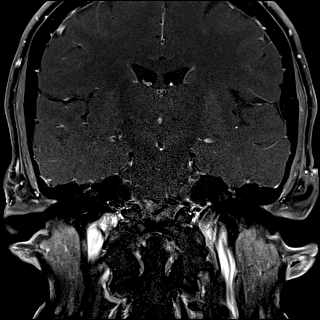

[Series 22: T2 post-contrast · coronal · 4.5mm · 0.36mm/px · 2 of 30 slices shown]
[im 1/30]
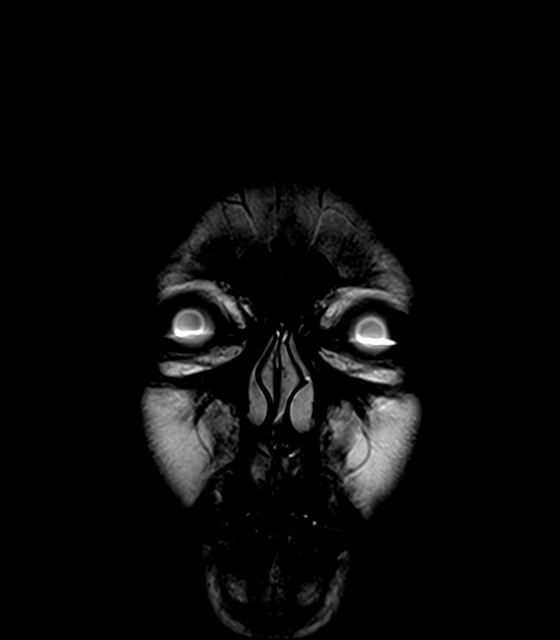
[im 30/30]
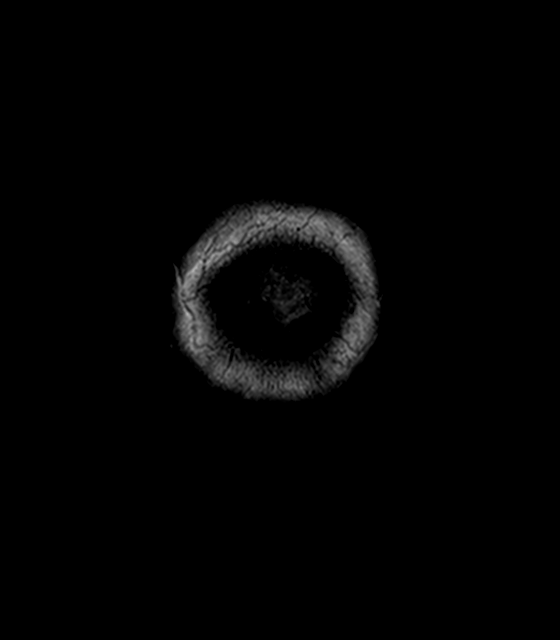

[Series 23: T1 post-contrast · coronal · 4.5mm · 0.90mm/px · 2 of 30 slices shown (2 of 2)]
[im 1/30]
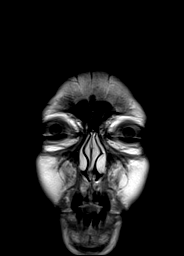
[im 30/30]
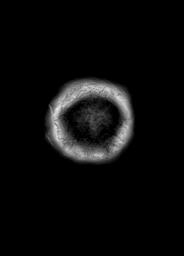

[33 of 48 positions shown; findings below may reference images not displayed]

FINDINGS: MRI HEAD FINDINGS

Brain: Brain has normal appearance without evidence of malformation,
atrophy, old or acute small or large vessel infarction, mass lesion,
hemorrhage, hydrocephalus or extra-axial collection.

Vascular: Major vessels at the base of the brain show flow. Venous
sinuses appear patent.

Skull and upper cervical spine: Normal.

Sinuses: Clear

Other: None significant.

Occurs.

MRI ORBITS FINDINGS

Both globes appear normal. Both optic nerves appear normal.
Extra-ocular muscles are normal. Orbital fat is normal. Orbital
apices are normal. Cavernous sinus regions are normal. No abnormal
contrast enhancement
IMPRESSION: Normal MRI of the brain and normal MRI of the orbits. No abnormality
seen to explain the presenting symptoms.

## 2019-02-16 ENCOUNTER — Other Ambulatory Visit: Payer: Self-pay

## 2019-02-16 ENCOUNTER — Ambulatory Visit
Admission: RE | Admit: 2019-02-16 | Discharge: 2019-02-16 | Disposition: A | Discharge status: Home / Self Care | Payer: Medicare Other | Source: Ambulatory Visit | Attending: Family Medicine | Admitting: Family Medicine

## 2019-02-16 DIAGNOSIS — Z1231 Encounter for screening mammogram for malignant neoplasm of breast: Secondary | ICD-10-CM

## 2019-02-19 DIAGNOSIS — M542 Cervicalgia: Secondary | ICD-10-CM | POA: Diagnosis not present

## 2019-02-27 DIAGNOSIS — Z86018 Personal history of other benign neoplasm: Secondary | ICD-10-CM | POA: Diagnosis not present

## 2019-02-27 DIAGNOSIS — D0471 Carcinoma in situ of skin of right lower limb, including hip: Secondary | ICD-10-CM | POA: Diagnosis not present

## 2019-02-27 DIAGNOSIS — Z85828 Personal history of other malignant neoplasm of skin: Secondary | ICD-10-CM | POA: Diagnosis not present

## 2019-02-27 DIAGNOSIS — L821 Other seborrheic keratosis: Secondary | ICD-10-CM | POA: Diagnosis not present

## 2019-02-27 DIAGNOSIS — D485 Neoplasm of uncertain behavior of skin: Secondary | ICD-10-CM | POA: Diagnosis not present

## 2019-02-27 DIAGNOSIS — L814 Other melanin hyperpigmentation: Secondary | ICD-10-CM | POA: Diagnosis not present

## 2019-02-27 DIAGNOSIS — L281 Prurigo nodularis: Secondary | ICD-10-CM | POA: Diagnosis not present

## 2019-02-27 DIAGNOSIS — L570 Actinic keratosis: Secondary | ICD-10-CM | POA: Diagnosis not present

## 2019-02-27 DIAGNOSIS — D225 Melanocytic nevi of trunk: Secondary | ICD-10-CM | POA: Diagnosis not present

## 2019-03-04 DIAGNOSIS — R1314 Dysphagia, pharyngoesophageal phase: Secondary | ICD-10-CM | POA: Diagnosis not present

## 2019-03-04 DIAGNOSIS — Z8739 Personal history of other diseases of the musculoskeletal system and connective tissue: Secondary | ICD-10-CM | POA: Diagnosis not present

## 2019-03-04 DIAGNOSIS — R490 Dysphonia: Secondary | ICD-10-CM | POA: Diagnosis not present

## 2019-03-04 DIAGNOSIS — R1310 Dysphagia, unspecified: Secondary | ICD-10-CM | POA: Insufficient documentation

## 2019-03-19 ENCOUNTER — Encounter: Payer: Self-pay | Admitting: Family Medicine

## 2019-03-19 ENCOUNTER — Other Ambulatory Visit: Payer: Self-pay

## 2019-03-19 ENCOUNTER — Telehealth: Payer: Self-pay | Admitting: *Deleted

## 2019-03-19 ENCOUNTER — Telehealth (INDEPENDENT_AMBULATORY_CARE_PROVIDER_SITE_OTHER): Payer: Medicare Other | Admitting: Family Medicine

## 2019-03-19 VITALS — Ht 67.0 in | Wt 174.0 lb

## 2019-03-19 DIAGNOSIS — R05 Cough: Secondary | ICD-10-CM | POA: Diagnosis not present

## 2019-03-19 DIAGNOSIS — Z7189 Other specified counseling: Secondary | ICD-10-CM | POA: Diagnosis not present

## 2019-03-19 DIAGNOSIS — Z20822 Contact with and (suspected) exposure to covid-19: Secondary | ICD-10-CM

## 2019-03-19 DIAGNOSIS — R059 Cough, unspecified: Secondary | ICD-10-CM

## 2019-03-19 MED ORDER — BENZONATATE 100 MG PO CAPS: 100.0000 mg | ORAL_CAPSULE | Freq: Three times a day (TID) | ORAL | 0 refills | Status: DC | PRN

## 2019-03-19 NOTE — Progress Notes (Signed)
CC: covid-19 test order

## 2019-03-19 NOTE — Patient Instructions (Addendum)
Cough may be due to various causes, but upper airway irritation form vocal cord issue, heartburn, or allergies with postnasal drip can contribute. Start pepcid over the counter, and avoid foods that can worsen GERD/heartburn.  Continue claritin, and try adding flonase nasal spray for allergies - 1 spray in each nostril Tessalon perles short term if needed.  Follow up prior to your trip if not improved - sooner if worse or more wheezing.     I will place the order through the [facility] for [employer] testing.   Food Choices for Gastroesophageal Reflux Disease, Adult When you have gastroesophageal reflux disease (GERD), the foods you eat and your eating habits are very important. Choosing the right foods can help ease your discomfort. Think about working with a nutrition specialist (dietitian) to help you make good choices. What are tips for following this plan?  Meals  Choose healthy foods that are low in fat, such as fruits, vegetables, whole grains, low-fat dairy products, and lean meat, fish, and poultry.  Eat small meals often instead of 3 large meals a day. Eat your meals slowly, and in a place where you are relaxed. Avoid bending over or lying down until 2-3 hours after eating.  Avoid eating meals 2-3 hours before bed.  Avoid drinking a lot of liquid with meals.  Cook foods using methods other than frying. Bake, grill, or broil food instead.  Avoid or limit: ? Chocolate. ? Peppermint or spearmint. ? Alcohol. ? Pepper. ? Black and decaffeinated coffee. ? Black and decaffeinated tea. ? Bubbly (carbonated) soft drinks. ? Caffeinated energy drinks and soft drinks.  Limit high-fat foods such as: ? Fatty meat or fried foods. ? Whole milk, cream, butter, or ice cream. ? Nuts and nut butters. ? Pastries, donuts, and sweets made with butter or shortening.  Avoid foods that cause symptoms. These foods may be different for everyone. Common foods that cause symptoms  include: ? Tomatoes. ? Oranges, lemons, and limes. ? Peppers. ? Spicy food. ? Onions and garlic. ? Vinegar. Lifestyle  Maintain a healthy weight. Ask your doctor what weight is healthy for you. If you need to lose weight, work with your doctor to do so safely.  Exercise for at least 30 minutes for 5 or more days each week, or as told by your doctor.  Wear loose-fitting clothes.  Do not smoke. If you need help quitting, ask your doctor.  Sleep with the head of your bed higher than your feet. Use a wedge under the mattress or blocks under the bed frame to raise the head of the bed. Summary  When you have gastroesophageal reflux disease (GERD), food and lifestyle choices are very important in easing your symptoms.  Eat small meals often instead of 3 large meals a day. Eat your meals slowly, and in a place where you are relaxed.  Limit high-fat foods such as fatty meat or fried foods.  Avoid bending over or lying down until 2-3 hours after eating.  Avoid peppermint and spearmint, caffeine, alcohol, and chocolate. This information is not intended to replace advice given to you by your health care provider. Make sure you discuss any questions you have with your health care provider. Document Released: 02/26/2012 Document Revised: 12/18/2018 Document Reviewed: 10/02/2016 Elsevier Patient Education  [employer].    If you have lab work done today you will be contacted with your lab results within the next 2 weeks.  If you have not heard from [place] then please  contact us. The fastest way to get your results is to register for My Chart.   IF you received an x-ray today, you will receive an invoice from [facility]. Please contact [facility] at [phone] with questions or concerns regarding your invoice.   IF you received labwork today, you will receive an invoice from [place]. Please contact [facility] at [phone] with questions or concerns regarding your  invoice.   Our billing staff will not be able to assist you with questions regarding bills from these companies.  You will be contacted with the lab results as soon as they are available. The fastest way to get your results is to activate your My Chart account. Instructions are located on the last page of this paperwork. If you have not heard from [place] regarding the results in 2 weeks, please contact this office.

## 2019-03-19 NOTE — Progress Notes (Signed)
Virtual Visit via Telephone Note  I connected with [name] on 03/19/19 at 9:55 AM by telephone and verified that I am speaking with the correct person using two identifiers.   I discussed the limitations, risks, security and privacy concerns of performing an evaluation and management service by telephone and the availability of in person appointments. I also discussed with the patient that there may be a patient responsible charge related to this service. The patient expressed understanding and agreed to proceed, consent obtained  Chief complaint:  Covid 19 testing, cough.   History of Present Illness: [name] is a 68 y.o. female  Needs testing for Covid, will be traveling to [place] and arriving on 7/18 (traveling to [place] on 7/17). Negative test required priot to travel, but needs to be within 7 days of arrival date.   Has had a cough since February/march. Dry cough. Wheezy sounding few days ago. irrritating cough.  No prior hx of asthma, no inhalers. Feels like below throat.  No fever.  No change in taste/smell.  Min nasal congestion past few days.  Feels well otherwise.  No contact with covid 19 positive person or those undergoing testing.  No N/V.  Diarrhea noted yesterday, thought was from food she ate- has had in past at times as missing portion of large intestine and lactose.  Some change in voice since surgery last year, seen by ENT 1 month ago, no concern on scope at that time. Will be seen by voice specialist in [facility] in few weeks.  Occasional acid reflux since throat surgery. Last flare few weeks ago. Notes every few weeks. pepcid years ago. No recent meds.   Tx: claritin QD. No recent cough drops.     Patient Active Problem List   Diagnosis Date Noted  . Cervical vertebral fusion 02/14/2018  . OA (osteoarthritis) of knee 06/17/2017  . Trochanteric bursitis of left hip 07/03/2015  . Routine general medical examination at a  health care facility 11/18/2014  . CARCINOMA, SKIN, SQUAMOUS CELL 07/06/2010  . ACTINIC KERATOSIS 07/06/2010  . NEOPLASM, MALIGNANT, BASAL CELL, CARCINOMA, HX OF 07/06/2010  . Hyperlipidemia 06/08/2009  . DEPRESSION 06/08/2009  . POSTMENOPAUSAL STATUS 06/08/2009  . IRRITABLE BOWEL SYNDROME 02/17/2008  . BACK PAIN, CHRONIC 02/17/2008  . MIGRAINE HEADACHE 02/05/2007  . Essential hypertension 02/05/2007   Past Medical History:  Diagnosis Date  . Arthritis    knees, hands  . Bursitis of left hip   . Cancer ([place])    hx skin cancer  . Depression   . GERD (gastroesophageal reflux disease)    INFREQUENT  . Headache    HX MIGRAINES   . Heart murmur     MVP 30 yrs ago - no treatment needed  . Hyperlipidemia    on statin  . Hypertension    "flucuations" has never been on any med  . Insomnia   . Irritable bowel syndrome    s/p subtotal colectomy '86>diarrhea prone  . NEOPLASM, MALIGNANT, BASAL CELL, CARCINOMA, HX OF   . Seasonal allergies    Past Surgical History:  Procedure Laterality Date  . ABDOMINOPLASTY    . ANTERIOR CERVICAL DECOMP/DISCECTOMY FUSION N/A 02/14/2018   Procedure: CERVICAL THREE-FOUR,CERVICAL FOUR-FIVE ACDF, REMOVAL CERVICAL FIVE-SEVEN  PLATE.;  Surgeon: Eustace Moore, MD;  Location: [facility];  Service: Neurosurgery;  Laterality: N/A;  anterior  . [place]   lower back  . BREAST ENHANCEMENT SURGERY  1983  . BREAST IMPLANT REMOVAL  08/2010  . BREAST SURGERY     Augmentation in 1983, Reduction 2011  . BUNIONECTOMY     right  and left foot  . CERVICAL SPINE SURGERY  12/18/1999   C5/6 and C6/7  . COLECTOMY  1986  . COSMETIC SURGERY    . EXCISION/RELEASE BURSA HIP Left 07/04/2015   Procedure: LEFT HIP BURSECTOMY WITH ;  Surgeon: Gaynelle Arabian, MD;  Location: [facility];  Service: Orthopedics;  Laterality: Left;  . EYE SURGERY     Lasik 2010  . eyelid surgery    . FRACTURE SURGERY    . HYSTEROSCOPY W/D&C  08/17/2011   Procedure: DILATATION AND CURETTAGE  (D&C) /HYSTEROSCOPY;  Surgeon: Margarette Asal;  Location: [facility];  Service: Gynecology;  Laterality: N/A;  . INJECTION KNEE Right 07/04/2015   Procedure: CORTISONE INJECTION RIGHT KNEE ;  Surgeon: Gaynelle Arabian, MD;  Location: [facility];  Service: Orthopedics;  Laterality: Right;  . KNEE ARTHROSCOPY     right x 2  . KNEE RECONSTRUCTION, MEDIAL PATELLAR FEMORAL LIGAMENT     right  . LASIK  2009    bilateral  . left foot surgery     removed foreign object  . OPEN SURGICAL REPAIR OF GLUTEAL TENDON Left 07/04/2015   Procedure: GLUTEAL TENDON REPAIR ;  Surgeon: Gaynelle Arabian, MD;  Location: [facility];  Service: Orthopedics;  Laterality: Left;  . REDUCTION MAMMAPLASTY Bilateral   . thumb surgery    . TOTAL KNEE ARTHROPLASTY Right 06/17/2017   Procedure: RIGHT TOTAL KNEE ARTHROPLASTY;  Surgeon: Gaynelle Arabian, MD;  Location: [facility];  Service: Orthopedics;  Laterality: Right;  . TUBAL LIGATION     Allergies  Allergen Reactions  . Nsaids Other (See Comments)    Hurts stomach   Prior to Admission medications   Medication Sig Start Date End Date Taking? Authorizing Provider  atorvastatin (LIPITOR) 20 MG tablet TAKE 1 TABLET EVERY MORNING 12/08/18  Yes Stallings, Zoe A, MD  buPROPion (WELLBUTRIN XL) 300 MG 24 hr tablet TAKE 1 TABLET DAILY 12/08/18  Yes Stallings, Zoe A, MD  Calcium-Phosphorus-Vitamin D (CITRACAL +D3 PO) Take 1,000 Units by mouth daily.    Yes [provider]  cholecalciferol (VITAMIN D) 1000 units tablet Take 1,000 Units by mouth daily.   Yes [provider]  Cyanocobalamin (VITAMIN B12) 1000 MCG TBCR Take 1,000 mcg by mouth daily.   Yes [provider]  diclofenac sodium (VOLTAREN) 1 % GEL Apply 1 g topically daily as needed.    Yes [provider]  furosemide (LASIX) 40 MG tablet TAKE 1 TABLET DAILY 12/08/18  Yes Stallings, Zoe A, MD  loratadine (CLARITIN) 10 MG tablet Take 10 mg by mouth daily.    Yes [provider]  Magnesium 300 MG  CAPS Take 300 mg by mouth daily.    Yes [provider]  niacinamide 500 MG tablet Take 500 mg by mouth 2 (two) times daily with a meal.   Yes [provider]  NON FORMULARY Take once a day   Yes [provider]  Simethicone (PHAZYME PO) Take 1 tablet by mouth every morning.    Yes [provider]  acetaminophen (TYLENOL) 500 MG tablet Take 1,000 mg by mouth every 8 (eight) hours as needed for moderate pain or headache.     [provider]  ciprofloxacin (CIPRO) 500 MG tablet Take 1 tablet (500 mg total) by mouth 2 (two) times daily. Patient not taking: Reported on 03/19/2019 09/24/18   Nolon Rod,  Zoe A, MD  cyclobenzaprine (FLEXERIL) 10 MG tablet Take 1 tablet (10 mg total) by mouth 3 (three) times daily as needed for muscle spasms (not relieved by Robaxin). 02/15/18   Costella, Vista Mink, PA-C  diphenoxylate-atropine (LOMOTIL) 2.5-0.025 MG tablet Take 1 tablet by mouth daily. 10/18/18   Forrest Moron, MD  fluorouracil (EFUDEX) 5 % cream Apply 1 application topically See admin instructions. In January applies daily for 3 weeks then off for 1-3 weeks then resume for 3 weeks. "For skin cancers" 05/31/15   [provider]  HYDROcodone-acetaminophen (NORCO) 10-325 MG tablet Take 1 tablet by mouth every 4 (four) hours as needed for moderate pain ((score 4 to 6)). Patient not taking: Reported on 03/19/2019 02/15/18   Costella, Vista Mink, PA-C  [facility] PAIN RELIEF PATCH EX) Apply 2 patches topically daily.    [provider]  promethazine (PHENERGAN) 12.5 MG tablet Take 0.5-1 tablets (6.25-12.5 mg total) by mouth every 6 (six) hours as needed for nausea (migraines). Patient not taking: Reported on 03/19/2019 10/17/18   Forrest Moron, MD  silver sulfADIAZINE (SILVADENE) 1 % cream Apply 1 application topically daily. Use enough to cover would with a light layer Patient not taking: Reported on 03/19/2019 10/17/18   Forrest Moron, MD  traZODone  (DESYREL) 50 MG tablet Take 0.5-1 tablets (25-50 mg total) by mouth at bedtime as needed for sleep. Patient not taking: Reported on 03/19/2019 10/18/18   Forrest Moron, MD   Social History   Socioeconomic History  . Marital status: Married    Spouse name: Not on file  . Number of children: 2  . Years of education: Not on file  . Highest education level: Some college, no degree  Occupational History  . Not on file  Social Needs  . Financial resource strain: Not hard at all  . Food insecurity    Worry: Never true    Inability: Never true  . Transportation needs    Medical: No    Non-medical: No  Tobacco Use  . Smoking status: Never Smoker  . Smokeless tobacco: Never Used  . 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 Alcohol use: Yes    Comment: occasional/ 2 drinks a month  . Drug use: No  . Sexual activity: Yes    Birth control/protection: Post-menopausal, Surgical  Lifestyle  . Physical activity    Days per week: 0 days    Minutes per session: 0 min  . Stress: Rather much  Relationships  . Social connections    Talks on phone: More than three times a week    Gets together: More than three times a week    Attends religious service: Never    Active member of club or organization: No    Attends meetings of clubs or organizations: Never    Relationship status: Married  . Intimate partner violence    Fear of current or ex partner: No    Emotionally abused: No    Physically abused: No    Forced sexual activity: No  Other Topics Concern  . Not on file  Social History Narrative   Lives at home with husband.  Retired.  Education some college.  2 children.  Caffeine 3-4 glasses of tea/ day.     Observations/Objective: Intelligible voice, no respiratory distress, speaking in full sentences, no cough during visit. Vitals:  03/19/19 0824  Weight: 174 lb (78.9 kg)   Height: 5\' 7"  (1.702 m)  Home reported vitals.   Assessment and Plan: Cough - Plan: benzonatate (TESSALON) 100 MG capsule,  -Past few months.  Possible vocal cord dysfunction based on description of symptoms and eval by ENT.  Has follow-up planned with specialist at [facility] in 2 weeks.  With increased congestion, upper airway symptoms, could have upper airway cough syndrome from postnasal drip from allergies versus irritation from GERD.  No fever, no other infectious symptoms.  -Trial of Flonase in addition to Claritin, trigger avoidance for GERD and trial of Pepcid.  Tessalon Perles short-term with RTC precautions if not improving next week, or worsening sooner.  Advice Given About Covid-19 Virus by Telephone - Plan:   -We will attempt to have test ordered for screening purposes prior to travel, but also with cough as above.  Less likely COVID cause.   Follow Up Instructions: If not improving.  Sooner if worse   Patient Instructions    Cough may be due to various causes, but upper airway irritation form vocal cord issue, heartburn, or allergies with postnasal drip can contribute. Start pepcid over the counter, and avoid foods that can worsen GERD/heartburn.  Continue claritin, and try adding flonase nasal spray for allergies - 1 spray in each nostril Tessalon perles short term if needed.  Follow up prior to your trip if not improved - sooner if worse or more wheezing.     I will place the order through the [facility] for [employer] testing.   Food Choices for Gastroesophageal Reflux Disease, Adult When you have gastroesophageal reflux disease (GERD), the foods you eat and your eating habits are very important. Choosing the right foods can help ease your discomfort. Think about working with a nutrition specialist (dietitian) to help you make good choices. What are tips for following this plan?  Meals  Choose healthy foods that are low in fat, such as fruits,  vegetables, whole grains, low-fat dairy products, and lean meat, fish, and poultry.  Eat small meals often instead of 3 large meals a day. Eat your meals slowly, and in a place where you are relaxed. Avoid bending over or lying down until 2-3 hours after eating.  Avoid eating meals 2-3 hours before bed.  Avoid drinking a lot of liquid with meals.  Cook foods using methods other than frying. Bake, grill, or broil food instead.  Avoid or limit: ? Chocolate. ? Peppermint or spearmint. ? Alcohol. ? Pepper. ? Black and decaffeinated coffee. ? Black and decaffeinated tea. ? Bubbly (carbonated) soft drinks. ? Caffeinated energy drinks and soft drinks.  Limit high-fat foods such as: ? Fatty meat or fried foods. ? Whole milk, cream, butter, or ice cream. ? Nuts and nut butters. ? Pastries, donuts, and sweets made with butter or shortening.  Avoid foods that cause symptoms. These foods may be different for everyone. Common foods that cause symptoms include: ? Tomatoes. ? Oranges, lemons, and limes. ? Peppers. ? Spicy food. ? Onions and garlic. ? Vinegar. Lifestyle  Maintain a healthy weight. Ask your doctor what weight is healthy for you. If you need to lose weight, work with your doctor to do so safely.  Exercise for at least 30 minutes for 5 or more days each week, or as told by your doctor.  Wear loose-fitting clothes.  Do not smoke. If you need help quitting, ask your doctor.  Sleep with the head of your  bed higher than your feet. Use a wedge under the mattress or blocks under the bed frame to raise the head of the bed. Summary  When you have gastroesophageal reflux disease (GERD), food and lifestyle choices are very important in easing your symptoms.  Eat small meals often instead of 3 large meals a day. Eat your meals slowly, and in a place where you are relaxed.  Limit high-fat foods such as fatty meat or fried foods.  Avoid bending over or lying down until 2-3 hours  after eating.  Avoid peppermint and spearmint, caffeine, alcohol, and chocolate. This information is not intended to replace advice given to you by your health care provider. Make sure you discuss any questions you have with your health care provider. Document Released: 02/26/2012 Document Revised: 12/18/2018 Document Reviewed: 10/02/2016 Elsevier Patient Education  [employer].    If you have lab work done today you will be contacted with your lab results within the next 2 weeks.  If you have not heard from [place] then please contact us. The fastest way to get your results is to register for My Chart.   IF you received an x-ray today, you will receive an invoice from [facility]. Please contact [facility] at [phone] with questions or concerns regarding your invoice.   IF you received labwork today, you will receive an invoice from [place]. Please contact [facility] at [phone] with questions or concerns regarding your invoice.   Our billing staff will not be able to assist you with questions regarding bills from these companies.  You will be contacted with the lab results as soon as they are available. The fastest way to get your results is to activate your My Chart account. Instructions are located on the last page of this paperwork. If you have not heard from [place] regarding the results in 2 weeks, please contact this office.         I discussed the assessment and treatment plan with the patient. The patient was provided an opportunity to ask questions and all were answered. The patient agreed with the plan and demonstrated an understanding of the instructions.   The patient was advised to call back or seek an in-person evaluation if the symptoms worsen or if the condition fails to improve as anticipated.  I provided 20 minutes of non-face-to-face time during this encounter.  Signed,   Merri Ray, MD [facility].  03/19/19

## 2019-03-19 NOTE — Telephone Encounter (Addendum)
Contacted pt's husband, Gwyndolyn Saxon to schedule testing; pt accepted appointment at [facility] site 03/20/2019 at [place] on behalf of the pt; pt's husband given address, location, and instructions that he and all occupants of his vehicle should wear masks; he was also notified that results are typically available in 5-7 days, and will be placed in MyChart; he verbalized understanding; orders placed per protocol. ----- Message from Wendie Agreste, MD sent at 03/19/2019 10:15 AM EDT ----- Separate message sent regarding having spouse tested.   She is also going to be traveling to the [place] on July 18, leaving for [facility] on July 17.  Negative test required within 7 days of travel but will need results prior to starting travel (ideally having tested this Saturday).  However she has also had cough for the past few months and some increased nasal congestion past few days.  No other symptoms.  Please schedule COVID test if possible.  Thank you.

## 2019-03-20 ENCOUNTER — Other Ambulatory Visit: Payer: Medicare Other

## 2019-03-20 DIAGNOSIS — R6889 Other general symptoms and signs: Secondary | ICD-10-CM | POA: Diagnosis not present

## 2019-03-20 DIAGNOSIS — Z20822 Contact with and (suspected) exposure to covid-19: Secondary | ICD-10-CM

## 2019-03-26 LAB — NOVEL CORONAVIRUS, NAA: SARS-CoV-2, NAA: NOT DETECTED

## 2019-04-02 IMAGING — CR DG CHEST 2V
2 series · 2 of 2 positions shown · non-contrast
Comparison: None.

CLINICAL DATA: Preoperative respiratory exam for cervical spine
fusion surgery.

EXAM:
CHEST - 2 VIEW

[w chest pa]
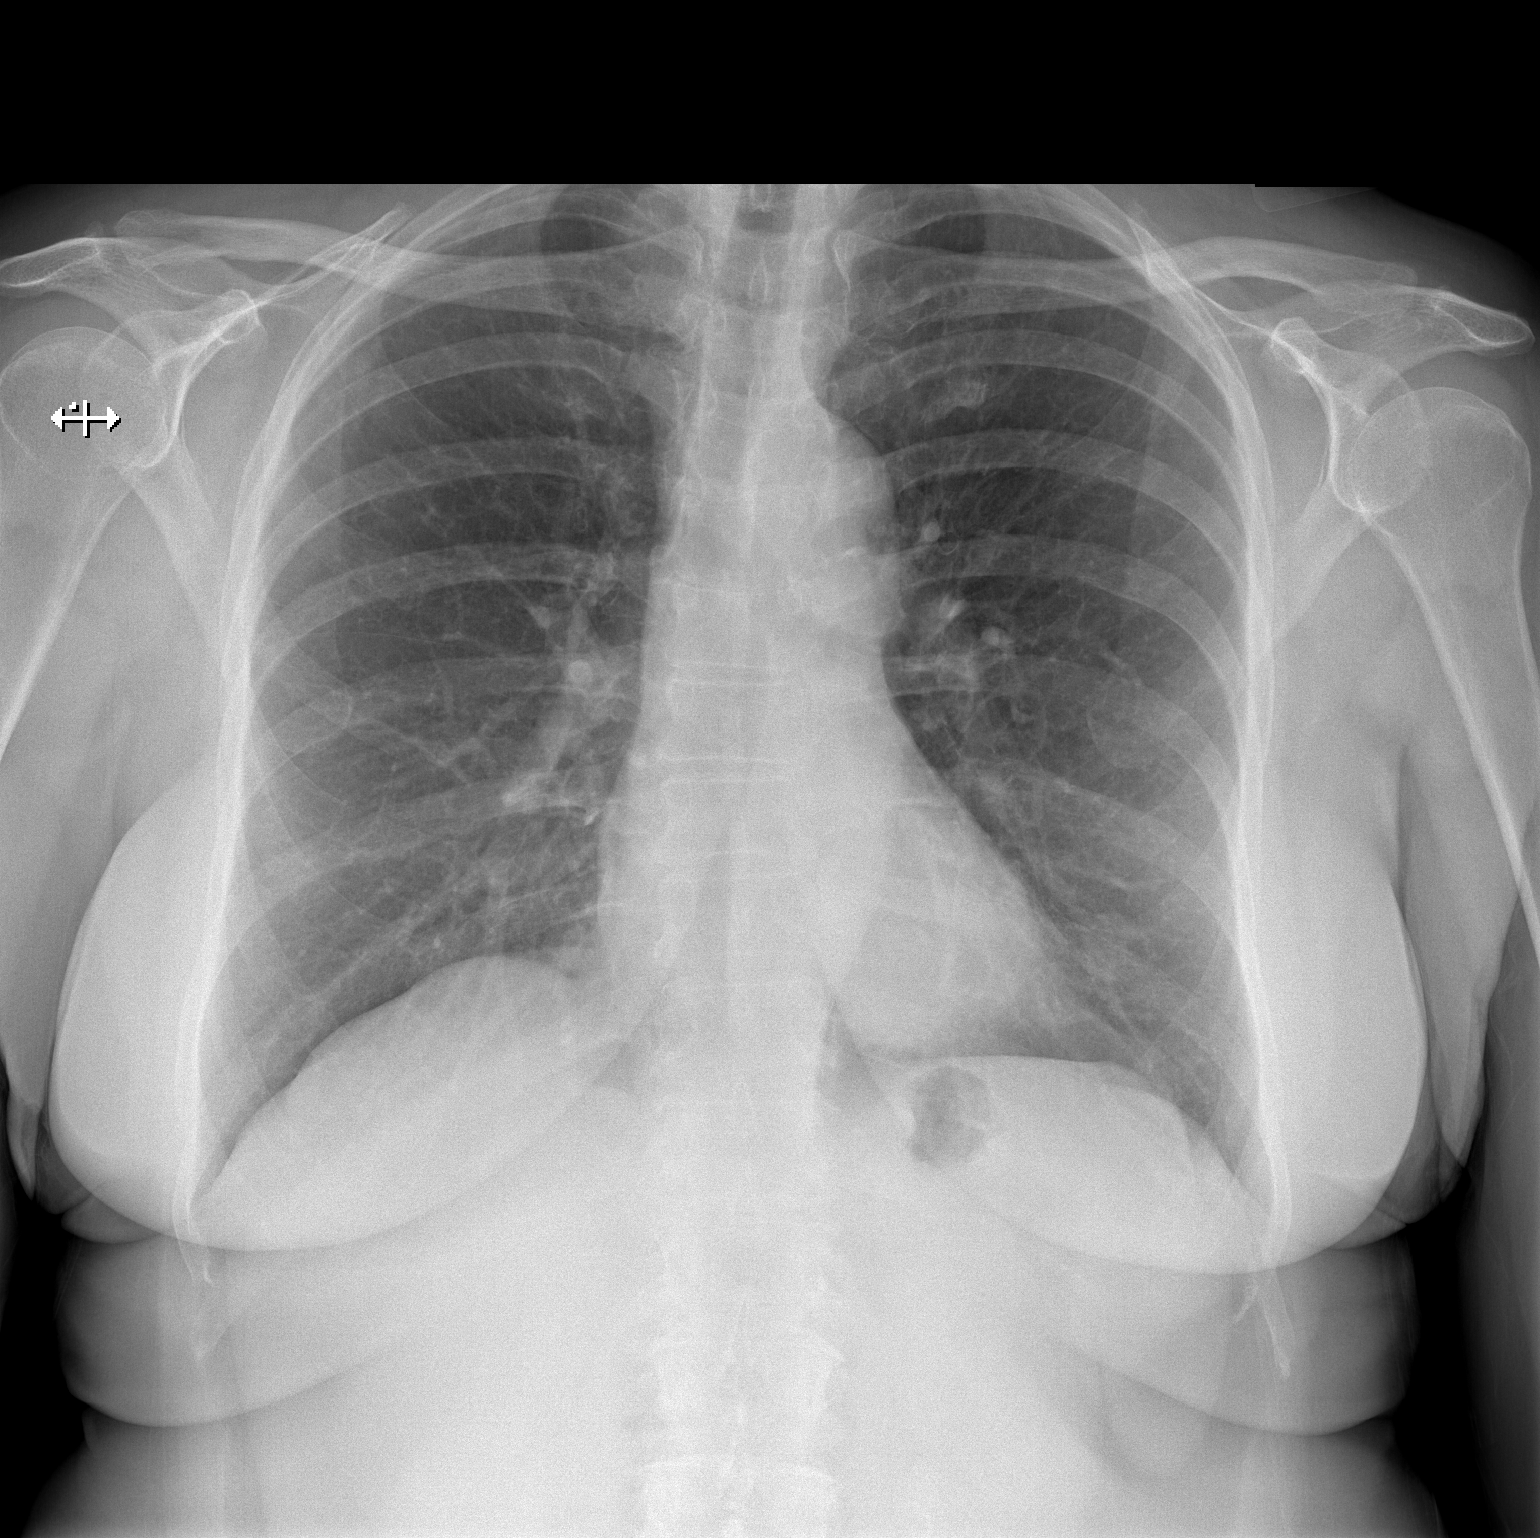

[w chest lat]
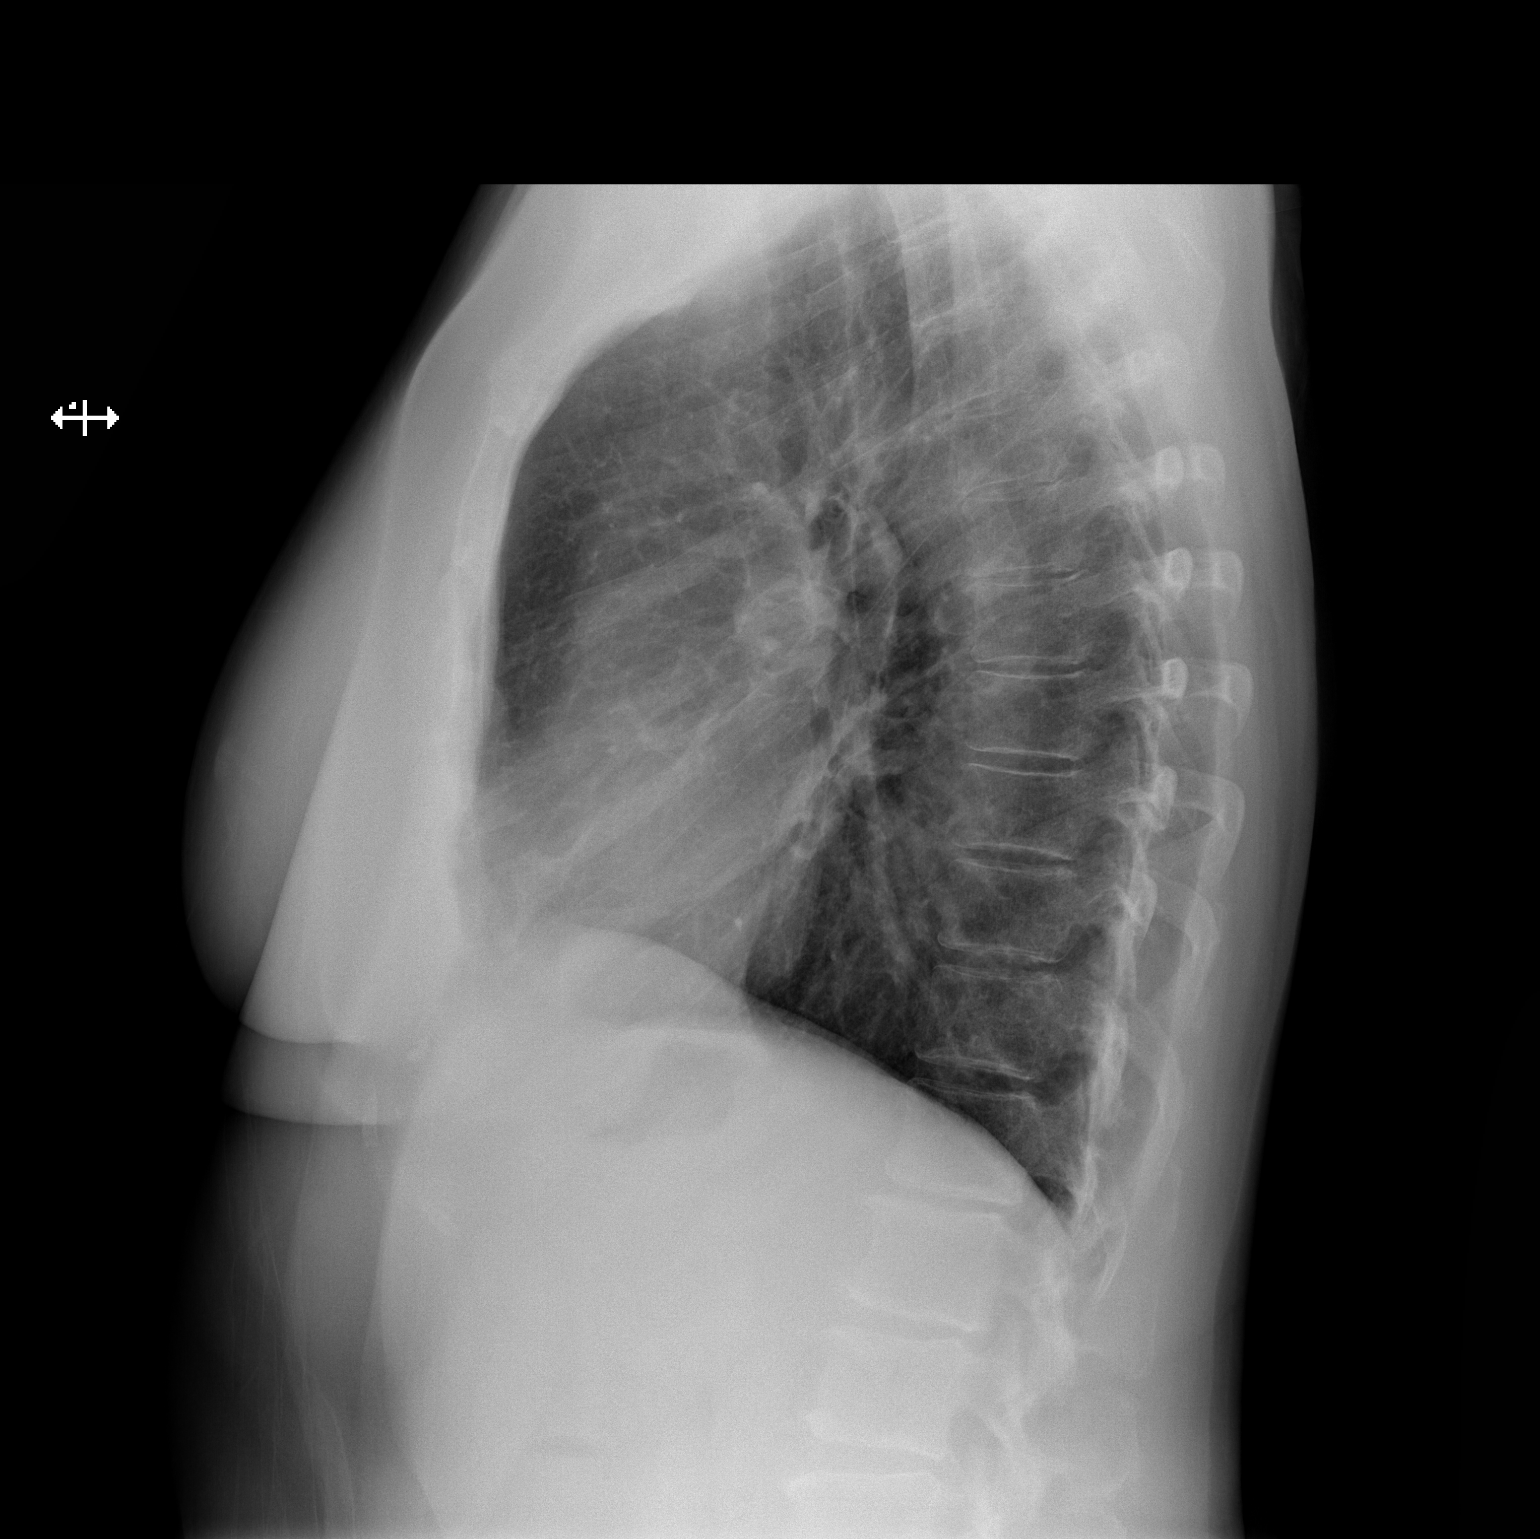

[2 of 2 positions shown; findings below may reference images not displayed]

FINDINGS: The heart size and mediastinal contours are within normal limits.
There is no evidence of pulmonary edema, consolidation,
pneumothorax, nodule or pleural fluid. The visualized skeletal
structures are unremarkable.
IMPRESSION: No active cardiopulmonary disease.

## 2019-04-13 ENCOUNTER — Other Ambulatory Visit: Payer: Self-pay

## 2019-04-13 ENCOUNTER — Ambulatory Visit (INDEPENDENT_AMBULATORY_CARE_PROVIDER_SITE_OTHER): Payer: Medicare Other | Admitting: Family Medicine

## 2019-04-13 ENCOUNTER — Encounter: Payer: Self-pay | Admitting: Family Medicine

## 2019-04-13 VITALS — BP 138/83 | HR 69 | Temp 98.5°F | Resp 17 | Ht 67.0 in | Wt 172.2 lb

## 2019-04-13 DIAGNOSIS — R1310 Dysphagia, unspecified: Secondary | ICD-10-CM | POA: Diagnosis not present

## 2019-04-13 DIAGNOSIS — I10 Essential (primary) hypertension: Secondary | ICD-10-CM | POA: Diagnosis not present

## 2019-04-13 DIAGNOSIS — E782 Mixed hyperlipidemia: Secondary | ICD-10-CM | POA: Diagnosis not present

## 2019-04-13 DIAGNOSIS — R195 Other fecal abnormalities: Secondary | ICD-10-CM

## 2019-04-13 DIAGNOSIS — R1319 Other dysphagia: Secondary | ICD-10-CM

## 2019-04-13 MED ORDER — LOPERAMIDE HCL 2 MG PO TABS: 2.0000 mg | ORAL_TABLET | Freq: Three times a day (TID) | ORAL | 3 refills | Status: AC | PRN

## 2019-04-13 NOTE — Patient Instructions (Addendum)
If you have lab work done today you will be contacted with your lab results within the next 2 weeks.  If you have not heard from [place] then please contact us. The fastest way to get your results is to register for My Chart.   IF you received an x-ray today, you will receive an invoice from [facility]. Please contact [facility] at [phone] with questions or concerns regarding your invoice.   IF you received labwork today, you will receive an invoice from [facility]. Please contact [facility] at [phone] with questions or concerns regarding your invoice.   Our billing staff will not be able to assist you with questions regarding bills from these companies.  You will be contacted with the lab results as soon as they are available. The fastest way to get your results is to activate your My Chart account. Instructions are located on the last page of this paperwork. If you have not heard from [place] regarding the results in 2 weeks, please contact this office.     Probiotics Probiotics are the good bacteria and yeasts that live in your body and keep your digestive system healthy. Probiotics also help your body's defense system (immune system) and protect your body against the growth of harmful bacteria. Your health care provider may recommend taking a probiotic if you are taking antibiotics or have certain medical conditions, such as:  Diarrhea.  Constipation.  Irritable bowel syndrome.  Lung infections.  Yeast infections.  Acne, eczema, and other skin conditions.  Frequent urinary tract infections. What affects the balance of bacteria in my body? The balance of good bacteria in your body can be affected by:  Antibiotic medicines. These medicines treat infections caused by bacteria. Unfortunately, they may kill the good bacteria in your body as well as the bad bacteria.  Certain medical conditions. Conditions related to an imbalance of bacteria include: ? Stomach  and intestine (gastrointestinal) infections. ? Lung infections. ? Skin infections. ? Vaginal infections. ? Inflammatory bowel diseases. ? Stomach ulcers (gastric ulcers). ? Tooth decay and gum disease (periodontal disease).  Stress.  Poor diet. What type of probiotic is right for me? Probiotics contain different types of bacteria (strains). Strains commonly found in probiotics include:  Lactobacillus.  Saccharomyces.  Bifidobacterium. Specific strains have been shown to be more effective for certain health conditions. Ask your health care provider which strain or strains you should use and how often. Probiotics come in many different forms, strain combinations, and strengths. Some may need to be refrigerated. Always read the label for storage and usage instructions. Certain foods, such as yogurt, contain probiotics. Probiotics can also be bought as a supplement at a pharmacy, health food store, or grocery store. Talk to your health care provider before starting any supplement. What are the side effects of probiotics? Some people have side effects when taking probiotics. Side effects are usually temporary and may include:  Gas.  Bloating.  Cramping. Serious side effects are rare. Follow these instructions at home:   If you are taking probiotics with antibiotics: ? Wait at least 2 hours between taking your medicine and the probiotic. ? Eat foods high in fiber, such as whole grains, beans, and vegetables. These foods can help good bacteria grow. ? Avoid certain foods as told by your health care provider. Summary  Probiotics are the good bacteria and yeasts that live in your body and keep you and your digestive system healthy.  Certain foods, such as yogurt, contain probiotics.  Probiotics can be taken as supplements. They can be bought at a pharmacy, health food store, or grocery store. They come in many different forms, strain combinations, and strengths.  Be sure to talk  with your health care provider before taking a probiotic supplement. This information is not intended to replace advice given to you by your health care provider. Make sure you discuss any questions you have with your health care provider. Document Released: 03/24/2014 Document Revised: 05/16/2018 Document Reviewed: 09/11/2017 Elsevier Patient Education  2020 [employer].

## 2019-04-13 NOTE — Progress Notes (Signed)
Established Patient Office Visit  Subjective:  Patient ID: [name], female    DOB: March 14, 1951  Age: 68 y.o. MRN: [id number]  CC:  Chief Complaint  Patient presents with  . blood pressure and cholesterol check    6 month f/u  . knot on collarbone, behind it    pt would like to discuss issues she is having with knot, having issues with her voice, choking and trouble inhaling  . 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 rx    would like something stronger than lomotil as she still had accidents after taking, per pt immodium did help     HPI [name] presents for    Loose Stools Pt reports that she takes the imodium once daily for loose stools It worked better than lomotil Rare abd cramping nonbloody diarrhea No constipation  Hypertension: Patient here for follow-up of elevated blood pressure. She is exercising and is adherent to low salt diet.  Blood pressure is well controlled at home. Cardiac symptoms none. Patient denies chest pain, chest pressure/discomfort, claudication, exertional chest pressure/discomfort, fatigue, irregular heart beat, near-syncope and orthopnea.  Cardiovascular risk factors: advanced age (older than 78 for men, 73 for women), dyslipidemia and hypertension. Use of agents associated with hypertension: none. History of target organ damage: none. BP Readings from Last 3 Encounters:  04/13/19 138/83  10/17/18 122/70  09/24/18 (!) 146/78    Dyslipidemia: Patient presents for evaluation of lipids.  Compliance with treatment thus far has been excellent.  A repeat fasting lipid profile was done.  The patient does not use medications that may worsen dyslipidemias (corticosteroids, progestins, anabolic steroids, diuretics, beta-blockers, amiodarone, cyclosporine, olanzapine). The patient exercises rarely.  The patient is not known to have coexisting coronary artery disease.   Lab Results  Component Value Date   CHOL 175 10/13/2018   CHOL 186 09/23/2017   CHOL 176 09/19/2016   Lab Results  Component Value Date   HDL 54 10/13/2018   HDL 69 09/23/2017   HDL 66 09/19/2016   Lab Results  Component Value Date   LDLCALC 86 10/13/2018   LDLCALC 59 09/23/2017   LDLCALC 81 09/19/2016   Lab Results  Component Value Date   TRIG 177 (H) 10/13/2018   TRIG 290 (H) 09/23/2017   TRIG 144 09/19/2016   Lab Results  Component Value Date   CHOLHDL 3.2 10/13/2018   CHOLHDL 2.7 09/23/2017   CHOLHDL 2.7 09/19/2016   No results found for: LDLDIRECT   Difficulty with swallowing She already had a larygnoscopy with ENT and is getting set up for EGD She reports that it feels like there is a blockage with swallowing and sometimes pressure with speaking She states that she worries the bony end of her clavicle on the right side towards the sternum is pressing on something     Past Medical History:  Diagnosis Date  . Arthritis    knees, hands  . Bursitis of left hip   . Cancer ([place])    hx skin cancer  . Depression   . GERD (gastroesophageal reflux disease)    INFREQUENT  . Headache    HX MIGRAINES   . Heart murmur     MVP 30 yrs ago - no treatment needed  . Hyperlipidemia    on statin  . Hypertension    "flucuations" has never been on any med  . Insomnia   . Irritable bowel  syndrome    s/p subtotal colectomy '86>diarrhea prone  . NEOPLASM, MALIGNANT, BASAL CELL, CARCINOMA, HX OF   . Seasonal allergies     Past Surgical History:  Procedure Laterality Date  . ABDOMINOPLASTY    . ANTERIOR CERVICAL DECOMP/DISCECTOMY FUSION N/A 02/14/2018   Procedure: CERVICAL THREE-FOUR,CERVICAL FOUR-FIVE ACDF, REMOVAL CERVICAL FIVE-SEVEN  PLATE.;  Surgeon: Eustace Moore, MD;  Location: [facility];  Service: Neurosurgery;  Laterality: N/A;  anterior  . [place]   lower back  . BREAST ENHANCEMENT SURGERY  1983  . BREAST IMPLANT REMOVAL  08/2010  . BREAST SURGERY     Augmentation in 1983, Reduction  2011  . BUNIONECTOMY     right  and left foot  . CERVICAL SPINE SURGERY  12/18/1999   C5/6 and C6/7  . COLECTOMY  1986  . COSMETIC SURGERY    . EXCISION/RELEASE BURSA HIP Left 07/04/2015   Procedure: LEFT HIP BURSECTOMY WITH ;  Surgeon: Gaynelle Arabian, MD;  Location: [facility];  Service: Orthopedics;  Laterality: Left;  . EYE SURGERY     Lasik 2010  . eyelid surgery    . FRACTURE SURGERY    . HYSTEROSCOPY W/D&C  08/17/2011   Procedure: DILATATION AND CURETTAGE (D&C) /HYSTEROSCOPY;  Surgeon: Margarette Asal;  Location: [facility];  Service: Gynecology;  Laterality: N/A;  . INJECTION KNEE Right 07/04/2015   Procedure: CORTISONE INJECTION RIGHT KNEE ;  Surgeon: Gaynelle Arabian, MD;  Location: [facility];  Service: Orthopedics;  Laterality: Right;  . KNEE ARTHROSCOPY     right x 2  . KNEE RECONSTRUCTION, MEDIAL PATELLAR FEMORAL LIGAMENT     right  . LASIK  2009    bilateral  . left foot surgery     removed foreign object  . OPEN SURGICAL REPAIR OF GLUTEAL TENDON Left 07/04/2015   Procedure: GLUTEAL TENDON REPAIR ;  Surgeon: Gaynelle Arabian, MD;  Location: [facility];  Service: Orthopedics;  Laterality: Left;  . REDUCTION MAMMAPLASTY Bilateral   . thumb surgery    . TOTAL KNEE ARTHROPLASTY Right 06/17/2017   Procedure: RIGHT TOTAL KNEE ARTHROPLASTY;  Surgeon: Gaynelle Arabian, MD;  Location: [facility];  Service: Orthopedics;  Laterality: Right;  . TUBAL LIGATION      Family History  Problem Relation Age of Onset  . Hypertension Mother   . Heart disease Mother   . Stomach cancer Father 33  . Cancer Father        stomach and liver cancer  . Diabetes Brother   . Heart disease Brother   . Hypertension Brother   . Cancer Daughter        breast cancer  . Hypertension Daughter   . Hypertension Brother   . Heart disease Brother   . Thyroid disease Sister   . Breast cancer Sister   . Hypertension Sister   . Psoriasis Sister   . High Cholesterol Sister   . Neuropathy Sister   . Colon cancer Neg Hx      Social History   Socioeconomic History  . Marital status: Married    Spouse name: Not on file  . Number of children: 2  . Years of education: Not on file  . Highest education level: Some college, no degree  Occupational History  . Not on file  Social Needs  . Financial resource strain: Not hard at all  . Food insecurity    Worry: Never true    Inability: Never true  . Transportation needs  Medical: No    Non-medical: No  Tobacco Use  . Smoking status: Never Smoker  . Smokeless tobacco: Never Used  . 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 Alcohol use: Yes    Comment: occasional/ 2 drinks a month  . Drug use: No  . Sexual activity: Yes    Birth control/protection: Post-menopausal, Surgical  Lifestyle  . Physical activity    Days per week: 0 days    Minutes per session: 0 min  . Stress: Rather much  Relationships  . Social connections    Talks on phone: More than three times a week    Gets together: More than three times a week    Attends religious service: Never    Active member of club or organization: No    Attends meetings of clubs or organizations: Never    Relationship status: Married  . Intimate partner violence    Fear of current or ex partner: No    Emotionally abused: No    Physically abused: No    Forced sexual activity: No  Other Topics Concern  . Not on file  Social History Narrative   Lives at home with husband.  Retired.  Education some college.  2 children.  Caffeine 3-4 glasses of tea/ day.    Outpatient Medications Prior to Visit  Medication Sig Dispense Refill  . acetaminophen (TYLENOL) 500 MG tablet Take 1,000 mg by mouth every 8 (eight) hours as needed for moderate pain or headache.     Marland Kitchen atorvastatin (LIPITOR) 20 MG tablet TAKE 1 TABLET EVERY MORNING 90 tablet 1  . benzonatate (TESSALON) 100 MG capsule Take 1 capsule (100 mg  total) by mouth 3 (three) times daily as needed for cough. 20 capsule 0  . buPROPion (WELLBUTRIN XL) 300 MG 24 hr tablet TAKE 1 TABLET DAILY 90 tablet 1  . Calcium-Phosphorus-Vitamin D (CITRACAL +D3 PO) Take 1,000 Units by mouth daily.     . Cyanocobalamin (VITAMIN B12) 1000 MCG TBCR Take 1,000 mcg by mouth daily.    . diclofenac sodium (VOLTAREN) 1 % GEL Apply 1 g topically daily as needed.     . famotidine (PEPCID) 10 MG tablet Take 10 mg by mouth 2 (two) times daily.    . fluorouracil (EFUDEX) 5 % cream Apply 1 application topically See admin instructions. In January applies daily for 3 weeks then off for 1-3 weeks then resume for 3 weeks. "For skin cancers"  0  . furosemide (LASIX) 40 MG tablet TAKE 1 TABLET DAILY 90 tablet 1  . Glucosamine-Chondroitin-MSM (TRIPLE FLEX PO) Take by mouth.    . Liniments (SALONPAS PAIN RELIEF PATCH EX) Apply 2 patches topically daily.    Marland Kitchen loratadine (CLARITIN) 10 MG tablet Take 10 mg by mouth daily.     . Magnesium 300 MG CAPS Take 300 mg by mouth daily.     . niacinamide 500 MG tablet Take 500 mg by mouth 2 (two) times daily with a meal.    . NON FORMULARY Take once a day    . Simethicone (PHAZYME PO) Take 1 tablet by mouth every morning.     . cholecalciferol (VITAMIN D) 1000 units tablet Take 1,000 Units by mouth daily.    . diphenoxylate-atropine (LOMOTIL) 2.5-0.025 MG tablet Take 1 tablet by mouth daily. (Patient not taking: Reported on 04/13/2019) 90 tablet 1   No facility-administered medications  prior to visit.     Allergies  Allergen Reactions  . Nsaids Other (See Comments)    Hurts stomach    ROS Review of Systems Review of Systems  Constitutional: Negative for activity change, appetite change, chills and fever.  HENT: see hpi Respiratory: Negative for cough, shortness of breath and wheezing.   Gastrointestinal: Negative for diarrhea, nausea and vomiting.  Genitourinary: Negative for difficulty urinating, dysuria, flank pain and  hematuria.  Musculoskeletal: Negative for back pain, joint swelling and neck pain.  Neurological: Negative for dizziness, speech difficulty, light-headedness and numbness.  See HPI. All other review of systems negative.     Objective:    Physical Exam  BP 138/83 (BP Location: Right Arm, Patient Position: Sitting, Cuff Size: Large)   Pulse 69   Temp 98.5 F (36.9 C) (Oral)   Resp 17   Ht 5\' 7"  (1.702 m)   Wt 172 lb 3.2 oz (78.1 kg)   SpO2 97%   BMI 26.97 kg/m  Wt Readings from Last 3 Encounters:  04/13/19 172 lb 3.2 oz (78.1 kg)  03/19/19 174 lb (78.9 kg)  10/17/18 174 lb 12.8 oz (79.3 kg)   Physical Exam  Constitutional: Oriented to person, place, and time. Appears well-developed and well-nourished.  HENT:  Head: Normocephalic and atraumatic.  Eyes: Conjunctivae and EOM are normal.  Cardiovascular: Normal rate, regular rhythm, normal heart sounds and intact distal pulses.  No murmur heard. Pulmonary/Chest: Effort normal and breath sounds normal. No stridor. No respiratory distress. Has no wheezes.  Neurological: Is alert and oriented to person, place, and time.  Skin: Skin is warm. Capillary refill takes less than 2 seconds.  Psychiatric: Has a normal mood and affect. Behavior is normal. Judgment and thought content normal.  Musculoskeletal: right medial aspect of the clavicle is pronounced, nontender, no evidence of fracture  Health Maintenance Due  Topic Date Due  . DEXA SCAN  06/01/2015  . INFLUENZA VACCINE  04/11/2019    There are no preventive care reminders to display for this patient.  Lab Results  Component Value Date   TSH 1.940 10/13/2018   Lab Results  Component Value Date   WBC 7.1 01/31/2018   HGB 15.8 (H) 01/31/2018   HCT 48.5 (H) 01/31/2018   MCV 92.2 01/31/2018   PLT 275 01/31/2018   Lab Results  Component Value Date   NA 139 10/13/2018   K 4.5 10/13/2018   CO2 18 (L) 10/13/2018   GLUCOSE 99 10/13/2018   BUN 15 10/13/2018   CREATININE  1.01 (H) 10/13/2018   BILITOT 0.7 10/13/2018   ALKPHOS 99 10/13/2018   AST 25 10/13/2018   ALT 18 10/13/2018   PROT 6.7 10/13/2018   ALBUMIN 4.6 10/13/2018   CALCIUM 9.7 10/13/2018   ANIONGAP 12 01/31/2018   GFR 66.90 11/17/2014   Lab Results  Component Value Date   CHOL 175 10/13/2018   Lab Results  Component Value Date   HDL 54 10/13/2018   Lab Results  Component Value Date   LDLCALC 86 10/13/2018   Lab Results  Component Value Date   TRIG 177 (H) 10/13/2018   Lab Results  Component Value Date   CHOLHDL 3.2 10/13/2018   Lab Results  Component Value Date   HGBA1C 5.7 05/31/2009      Assessment & Plan:   Problem List Items Addressed This Visit      Cardiovascular and Mediastinum   Essential hypertension - Patient's blood pressure is at goal of 139/89 or  less. Condition is stable. Continue current medications and treatment plan. I recommend that you exercise for 30-45 minutes 5 days a week. I also recommend a balanced diet with fruits and vegetables every day, lean meats, and little fried foods. The DASH diet (you can find this online) is a good example of this.      Other   Hyperlipidemia - will recheck  Continue current meds and a healthy diet    Other Visit Diagnoses    Loose stools    -  Primary Advised imodium and probiotic Also advised metamucil    Esophageal dysphagia    - continue with GI for EGD As scheduled Discussed that the esophagus is a flexible tube and EGD will help to see if there is any compression      Meds ordered this encounter  Medications  . loperamide (IMODIUM A-D) 2 MG tablet    Sig: Take 1 tablet (2 mg total) by mouth 3 (three) times daily as needed for diarrhea or loose stools.    Dispense:  90 tablet    Refill:  3    Follow-up: Return in about 6 months (around 10/14/2019) for hypertension .    Forrest Moron, MD

## 2019-04-14 LAB — LIPID PANEL
Chol/HDL Ratio: 2.7 ratio (ref 0.0–4.4)
Cholesterol, Total: 169 mg/dL (ref 100–199)
HDL: 63 mg/dL (ref >39)
LDL Calculated: 84 mg/dL (ref 0–99)
Triglycerides: 112 mg/dL (ref 0–149)
VLDL Cholesterol Cal: 22 mg/dL (ref 5–40)

## 2019-04-14 LAB — CMP14+EGFR
ALT: 16 IU/L (ref 0–32)
AST: 20 IU/L (ref 0–40)
Albumin/Globulin Ratio: 2.8 — ABNORMAL HIGH (ref 1.2–2.2)
Albumin: 5 g/dL — ABNORMAL HIGH (ref 3.8–4.8)
Alkaline Phosphatase: 97 IU/L (ref 39–117)
BUN/Creatinine Ratio: 16 (ref 12–28)
BUN: 16 mg/dL (ref 8–27)
Bilirubin Total: 0.6 mg/dL (ref 0.0–1.2)
CO2: 22 mmol/L (ref 20–29)
Calcium: 10.1 mg/dL (ref 8.7–10.3)
Chloride: 104 mmol/L (ref 96–106)
Creatinine, Ser: 0.97 mg/dL (ref 0.57–1.00)
GFR calc Af Amer: 69 mL/min/{1.73_m2} (ref >59)
GFR calc non Af Amer: 60 mL/min/{1.73_m2} (ref >59)
Globulin, Total: 1.8 g/dL (ref 1.5–4.5)
Glucose: 104 mg/dL — ABNORMAL HIGH (ref 65–99)
Potassium: 3.9 mmol/L (ref 3.5–5.2)
Sodium: 145 mmol/L — ABNORMAL HIGH (ref 134–144)
Total Protein: 6.8 g/dL (ref 6.0–8.5)

## 2019-04-30 DIAGNOSIS — R490 Dysphonia: Secondary | ICD-10-CM | POA: Diagnosis not present

## 2019-04-30 DIAGNOSIS — M19019 Primary osteoarthritis, unspecified shoulder: Secondary | ICD-10-CM | POA: Diagnosis not present

## 2019-04-30 DIAGNOSIS — J385 Laryngeal spasm: Secondary | ICD-10-CM | POA: Diagnosis not present

## 2019-04-30 DIAGNOSIS — J342 Deviated nasal septum: Secondary | ICD-10-CM | POA: Diagnosis not present

## 2019-04-30 DIAGNOSIS — J383 Other diseases of vocal cords: Secondary | ICD-10-CM | POA: Diagnosis not present

## 2019-04-30 DIAGNOSIS — M19041 Primary osteoarthritis, right hand: Secondary | ICD-10-CM | POA: Diagnosis not present

## 2019-04-30 DIAGNOSIS — R1310 Dysphagia, unspecified: Secondary | ICD-10-CM | POA: Diagnosis not present

## 2019-04-30 DIAGNOSIS — Z973 Presence of spectacles and contact lenses: Secondary | ICD-10-CM | POA: Diagnosis not present

## 2019-04-30 DIAGNOSIS — M950 Acquired deformity of nose: Secondary | ICD-10-CM | POA: Diagnosis not present

## 2019-04-30 DIAGNOSIS — Z8739 Personal history of other diseases of the musculoskeletal system and connective tissue: Secondary | ICD-10-CM | POA: Diagnosis not present

## 2019-05-26 DIAGNOSIS — D0472 Carcinoma in situ of skin of left lower limb, including hip: Secondary | ICD-10-CM | POA: Diagnosis not present

## 2019-06-04 DIAGNOSIS — R490 Dysphonia: Secondary | ICD-10-CM | POA: Diagnosis not present

## 2019-06-04 DIAGNOSIS — J383 Other diseases of vocal cords: Secondary | ICD-10-CM | POA: Diagnosis not present

## 2019-06-04 DIAGNOSIS — J385 Laryngeal spasm: Secondary | ICD-10-CM | POA: Diagnosis not present

## 2019-06-11 DIAGNOSIS — J383 Other diseases of vocal cords: Secondary | ICD-10-CM | POA: Diagnosis not present

## 2019-06-11 DIAGNOSIS — R490 Dysphonia: Secondary | ICD-10-CM | POA: Diagnosis not present

## 2019-06-11 DIAGNOSIS — J385 Laryngeal spasm: Secondary | ICD-10-CM | POA: Diagnosis not present

## 2019-06-18 DIAGNOSIS — R490 Dysphonia: Secondary | ICD-10-CM | POA: Diagnosis not present

## 2019-06-18 DIAGNOSIS — J385 Laryngeal spasm: Secondary | ICD-10-CM | POA: Diagnosis not present

## 2019-06-18 DIAGNOSIS — J383 Other diseases of vocal cords: Secondary | ICD-10-CM | POA: Diagnosis not present

## 2019-06-19 DIAGNOSIS — J385 Laryngeal spasm: Secondary | ICD-10-CM | POA: Diagnosis not present

## 2019-06-19 DIAGNOSIS — J383 Other diseases of vocal cords: Secondary | ICD-10-CM | POA: Diagnosis not present

## 2019-07-07 ENCOUNTER — Other Ambulatory Visit: Payer: Self-pay | Admitting: Family Medicine

## 2019-07-12 DIAGNOSIS — H532 Diplopia: Secondary | ICD-10-CM | POA: Insufficient documentation

## 2019-07-16 DIAGNOSIS — J383 Other diseases of vocal cords: Secondary | ICD-10-CM | POA: Diagnosis not present

## 2019-07-27 DIAGNOSIS — L821 Other seborrheic keratosis: Secondary | ICD-10-CM | POA: Diagnosis not present

## 2019-07-27 DIAGNOSIS — Z85828 Personal history of other malignant neoplasm of skin: Secondary | ICD-10-CM | POA: Diagnosis not present

## 2019-07-27 DIAGNOSIS — C44722 Squamous cell carcinoma of skin of right lower limb, including hip: Secondary | ICD-10-CM | POA: Diagnosis not present

## 2019-07-27 DIAGNOSIS — D225 Melanocytic nevi of trunk: Secondary | ICD-10-CM | POA: Diagnosis not present

## 2019-07-27 DIAGNOSIS — Z86018 Personal history of other benign neoplasm: Secondary | ICD-10-CM | POA: Diagnosis not present

## 2019-07-27 DIAGNOSIS — L570 Actinic keratosis: Secondary | ICD-10-CM | POA: Diagnosis not present

## 2019-07-27 DIAGNOSIS — D485 Neoplasm of uncertain behavior of skin: Secondary | ICD-10-CM | POA: Diagnosis not present

## 2019-07-27 DIAGNOSIS — L820 Inflamed seborrheic keratosis: Secondary | ICD-10-CM | POA: Diagnosis not present

## 2019-07-27 DIAGNOSIS — L814 Other melanin hyperpigmentation: Secondary | ICD-10-CM | POA: Diagnosis not present

## 2019-08-10 DIAGNOSIS — Z20828 Contact with and (suspected) exposure to other viral communicable diseases: Secondary | ICD-10-CM | POA: Diagnosis not present

## 2019-08-10 DIAGNOSIS — Z01812 Encounter for preprocedural laboratory examination: Secondary | ICD-10-CM | POA: Diagnosis not present

## 2019-08-10 DIAGNOSIS — J383 Other diseases of vocal cords: Secondary | ICD-10-CM | POA: Diagnosis not present

## 2019-08-17 DIAGNOSIS — J383 Other diseases of vocal cords: Secondary | ICD-10-CM | POA: Diagnosis not present

## 2019-08-17 DIAGNOSIS — R490 Dysphonia: Secondary | ICD-10-CM | POA: Diagnosis not present

## 2019-08-17 DIAGNOSIS — Z981 Arthrodesis status: Secondary | ICD-10-CM | POA: Diagnosis not present

## 2019-08-21 DIAGNOSIS — Z23 Encounter for immunization: Secondary | ICD-10-CM | POA: Diagnosis not present

## 2019-09-17 DIAGNOSIS — M25552 Pain in left hip: Secondary | ICD-10-CM | POA: Diagnosis not present

## 2019-09-18 DIAGNOSIS — D049 Carcinoma in situ of skin, unspecified: Secondary | ICD-10-CM | POA: Insufficient documentation

## 2019-09-18 DIAGNOSIS — C44722 Squamous cell carcinoma of skin of right lower limb, including hip: Secondary | ICD-10-CM | POA: Diagnosis not present

## 2019-09-29 DIAGNOSIS — M542 Cervicalgia: Secondary | ICD-10-CM | POA: Diagnosis not present

## 2019-10-06 DIAGNOSIS — M5136 Other intervertebral disc degeneration, lumbar region: Secondary | ICD-10-CM | POA: Diagnosis not present

## 2019-10-07 ENCOUNTER — Ambulatory Visit: Payer: Medicare Other

## 2019-10-08 DIAGNOSIS — D485 Neoplasm of uncertain behavior of skin: Secondary | ICD-10-CM | POA: Diagnosis not present

## 2019-10-08 DIAGNOSIS — C44621 Squamous cell carcinoma of skin of unspecified upper limb, including shoulder: Secondary | ICD-10-CM | POA: Insufficient documentation

## 2019-10-08 DIAGNOSIS — C44622 Squamous cell carcinoma of skin of right upper limb, including shoulder: Secondary | ICD-10-CM | POA: Diagnosis not present

## 2019-10-12 DIAGNOSIS — H5203 Hypermetropia, bilateral: Secondary | ICD-10-CM | POA: Diagnosis not present

## 2019-10-12 DIAGNOSIS — H2513 Age-related nuclear cataract, bilateral: Secondary | ICD-10-CM | POA: Diagnosis not present

## 2019-10-16 ENCOUNTER — Ambulatory Visit: Payer: Medicare Other | Attending: Internal Medicine

## 2019-10-16 DIAGNOSIS — Z23 Encounter for immunization: Secondary | ICD-10-CM | POA: Insufficient documentation

## 2019-10-16 NOTE — Progress Notes (Signed)
   Covid-19 Vaccination Clinic  Name:  [name]    MRN: [id number] DOB: 01-03-1951  10/16/2019  [name] was observed post Covid-19 immunization for 15 minutes without incidence. She was provided with Vaccine Information Sheet and instruction to access the V-Safe system.   [name] was instructed to call [phone] with any severe reactions post vaccine: Marland Kitchen Difficulty breathing  . Swelling of your face and throat  . A fast heartbeat  . A bad rash all over your body  . Dizziness and weakness    Immunizations Administered    Name Date Dose VIS Date Route   Pfizer COVID-19 Vaccine 10/16/2019  1:42 PM 0.3 mL 08/21/2019 Intramuscular   Manufacturer: [place]   Lot: [id number]   [place]: [id number]

## 2019-10-19 ENCOUNTER — Ambulatory Visit (INDEPENDENT_AMBULATORY_CARE_PROVIDER_SITE_OTHER): Payer: Medicare Other | Admitting: Family Medicine

## 2019-10-19 ENCOUNTER — Other Ambulatory Visit: Payer: Self-pay

## 2019-10-19 ENCOUNTER — Encounter: Payer: Self-pay | Admitting: Family Medicine

## 2019-10-19 VITALS — BP 144/82 | HR 80 | Temp 98.3°F | Ht 67.0 in | Wt 163.5 lb

## 2019-10-19 DIAGNOSIS — I10 Essential (primary) hypertension: Secondary | ICD-10-CM

## 2019-10-19 DIAGNOSIS — Z0000 Encounter for general adult medical examination without abnormal findings: Secondary | ICD-10-CM

## 2019-10-19 DIAGNOSIS — Z0001 Encounter for general adult medical examination with abnormal findings: Secondary | ICD-10-CM

## 2019-10-19 DIAGNOSIS — E782 Mixed hyperlipidemia: Secondary | ICD-10-CM

## 2019-10-19 NOTE — Progress Notes (Signed)
QUICK REFERENCE INFORMATION: The ABCs of Providing the Annual Wellness Visit  CMS.gov Medicare Learning Network  [employer] Annual Wellness Visit  Subjective:   [name] is a 69 y.o. Female who presents for an Annual Wellness Visit.  Patient Active Problem List   Diagnosis Date Noted  . Cervical vertebral fusion 02/14/2018  . OA (osteoarthritis) of knee 06/17/2017  . Trochanteric bursitis of left hip 07/03/2015  . Routine general medical examination at a health care facility 11/18/2014  . CARCINOMA, SKIN, SQUAMOUS CELL 07/06/2010  . ACTINIC KERATOSIS 07/06/2010  . NEOPLASM, MALIGNANT, BASAL CELL, CARCINOMA, HX OF 07/06/2010  . Hyperlipidemia 06/08/2009  . DEPRESSION 06/08/2009  . POSTMENOPAUSAL STATUS 06/08/2009  . IRRITABLE BOWEL SYNDROME 02/17/2008  . BACK PAIN, CHRONIC 02/17/2008  . MIGRAINE HEADACHE 02/05/2007  . Essential hypertension 02/05/2007    Past Medical History:  Diagnosis Date  . Arthritis    knees, hands  . Bursitis of left hip   . Cancer ([place])    hx skin cancer  . Depression   . GERD (gastroesophageal reflux disease)    INFREQUENT  . Headache    HX MIGRAINES   . Heart murmur     MVP 30 yrs ago - no treatment needed  . Hyperlipidemia    on statin  . Hypertension    "flucuations" has never been on any med  . Insomnia   . Irritable bowel syndrome    s/p subtotal colectomy '86>diarrhea prone  . NEOPLASM, MALIGNANT, BASAL CELL, CARCINOMA, HX OF   . Seasonal allergies      Past Surgical History:  Procedure Laterality Date  . ABDOMINOPLASTY    . ANTERIOR CERVICAL DECOMP/DISCECTOMY FUSION N/A 02/14/2018   Procedure: CERVICAL THREE-FOUR,CERVICAL FOUR-FIVE ACDF, REMOVAL CERVICAL FIVE-SEVEN  PLATE.;  Surgeon: Eustace Moore, MD;  Location: [facility];  Service: Neurosurgery;  Laterality: N/A;  anterior  . [place]   lower back  . BREAST ENHANCEMENT SURGERY  1983  . BREAST IMPLANT REMOVAL  08/2010  . BREAST SURGERY     Augmentation in 1983,  Reduction 2011  . BUNIONECTOMY     right  and left foot  . CERVICAL SPINE SURGERY  12/18/1999   C5/6 and C6/7  . COLECTOMY  1986  . COSMETIC SURGERY    . EXCISION/RELEASE BURSA HIP Left 07/04/2015   Procedure: LEFT HIP BURSECTOMY WITH ;  Surgeon: Gaynelle Arabian, MD;  Location: [facility];  Service: Orthopedics;  Laterality: Left;  . EYE SURGERY     Lasik 2010  . eyelid surgery    . FRACTURE SURGERY    . HYSTEROSCOPY WITH D & C  08/17/2011   Procedure: DILATATION AND CURETTAGE (D&C) /HYSTEROSCOPY;  Surgeon: Margarette Asal;  Location: [facility];  Service: Gynecology;  Laterality: N/A;  . INJECTION KNEE Right 07/04/2015   Procedure: CORTISONE INJECTION RIGHT KNEE ;  Surgeon: Gaynelle Arabian, MD;  Location: [facility];  Service: Orthopedics;  Laterality: Right;  . KNEE ARTHROSCOPY     right x 2  . KNEE RECONSTRUCTION, MEDIAL PATELLAR FEMORAL LIGAMENT     right  . LASIK  2009    bilateral  . left foot surgery     removed foreign object  . OPEN SURGICAL REPAIR OF GLUTEAL TENDON Left 07/04/2015   Procedure: GLUTEAL TENDON REPAIR ;  Surgeon: Gaynelle Arabian, MD;  Location: [facility];  Service: Orthopedics;  Laterality: Left;  . REDUCTION MAMMAPLASTY Bilateral   . thumb surgery    . TOTAL KNEE ARTHROPLASTY Right  06/17/2017   Procedure: RIGHT TOTAL KNEE ARTHROPLASTY;  Surgeon: Gaynelle Arabian, MD;  Location: [facility];  Service: Orthopedics;  Laterality: Right;  . TUBAL LIGATION       Outpatient Medications Prior to Visit  Medication Sig Dispense Refill  . acetaminophen (TYLENOL) 500 MG tablet Take 1,000 mg by mouth every 8 (eight) hours as needed for moderate pain or headache.     Marland Kitchen atorvastatin (LIPITOR) 20 MG tablet TAKE 1 TABLET EVERY MORNING 90 tablet 1  . buPROPion (WELLBUTRIN XL) 300 MG 24 hr tablet TAKE 1 TABLET DAILY 90 tablet 1  . Calcium-Phosphorus-Vitamin D (CITRACAL +D3 PO) Take 1,000 Units by mouth daily.     . cholecalciferol (VITAMIN D) 1000 units tablet Take 1,000 Units by mouth daily.    .  Cyanocobalamin (VITAMIN B12) 1000 MCG TBCR Take 1,000 mcg by mouth daily.    . diclofenac sodium (VOLTAREN) 1 % GEL Apply 1 g topically daily as needed.     . famotidine (PEPCID) 10 MG tablet Take 10 mg by mouth 2 (two) times daily.    . fluorouracil (EFUDEX) 5 % cream Apply 1 application topically See admin instructions. In January applies daily for 3 weeks then off for 1-3 weeks then resume for 3 weeks. "For skin cancers"  0  . furosemide (LASIX) 40 MG tablet TAKE 1 TABLET DAILY 90 tablet 1  . Glucosamine-Chondroitin-MSM (TRIPLE FLEX PO) Take by mouth.    . Liniments (SALONPAS PAIN RELIEF PATCH EX) Apply 2 patches topically daily.    Marland Kitchen loperamide (IMODIUM A-D) 2 MG tablet Take 1 tablet (2 mg total) by mouth 3 (three) times daily as needed for diarrhea or loose stools. 90 tablet 3  . loratadine (CLARITIN) 10 MG tablet Take 10 mg by mouth daily.     . Magnesium 300 MG CAPS Take 300 mg by mouth daily.     . niacinamide 500 MG tablet Take 500 mg by mouth 2 (two) times daily with a meal.    . Simethicone (PHAZYME PO) Take 1 tablet by mouth every morning.     . traZODone (DESYREL) 50 MG tablet trazodone 50 mg tablet    . benzonatate (TESSALON) 100 MG capsule Take 1 capsule (100 mg total) by mouth 3 (three) times daily as needed for cough. 20 capsule 0  . diphenoxylate-atropine (LOMOTIL) 2.5-0.025 MG tablet Take 1 tablet by mouth daily. (Patient not taking: Reported on 04/13/2019) 90 tablet 1  . NON FORMULARY Take once a day     No facility-administered medications prior to visit.    Allergies  Allergen Reactions  . Nsaids Other (See Comments)    Hurts stomach     Family History  Problem Relation Age of Onset  . Hypertension Mother   . Heart disease Mother   . Stomach cancer Father 68  . Cancer Father        stomach and liver cancer  . Diabetes Brother   . Heart disease Brother   . Hypertension Brother   . Cancer Daughter        breast cancer  . Hypertension Daughter   . Hypertension  Brother   . Heart disease Brother   . Thyroid disease Sister   . Breast cancer Sister   . Hypertension Sister   . Psoriasis Sister   . High Cholesterol Sister   . Neuropathy Sister   . Colon cancer Neg Hx      Social History   Socioeconomic History  . Marital status: Married  Spouse name: Not on file  . Number of children: 2  . Years of education: Not on file  . Highest education level: Some college, no degree  Occupational History  . Not on file  Tobacco Use  . Smoking status: Never Smoker  . Smokeless tobacco: Never Used  . 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 Alcohol use: Yes    Comment: occasional/ 2 drinks a month  . Drug use: No  . Sexual activity: Yes    Birth control/protection: Post-menopausal, Surgical  Other Topics Concern  . Not on file  Social History Narrative   Lives at home with husband.  Retired.  Education some college.  2 children.  Caffeine 3-4 glasses of tea/ day.   Social Determinants of Health   Financial Resource Strain:   . Difficulty of Paying Living Expenses: Not on file  Food Insecurity:   . Worried About Charity fundraiser in the Last Year: Not on file  . Ran Out of Food in the Last Year: Not on file  Transportation Needs:   . Lack of Transportation (Medical): Not on file  . Lack of Transportation (Non-Medical): Not on file  Physical Activity:   . Days of Exercise per Week: Not on file  . Minutes of Exercise per Session: Not on file  Stress:   . Feeling of Stress : Not on file  Social Connections:   . Frequency of Communication with Friends and Family: Not on file  . Frequency of Social Gatherings with Friends and Family: Not on file  . Attends Religious Services: Not on file  . Active Member of Clubs or Organizations: Not on file  . Attends Archivist Meetings: Not on file  . Marital Status: Not on file       Recent Hospitalizations? No  Health Habits: Current exercise activities include: none Exercise: 0 times/week. Diet: in general, a "healthy" diet    Alcohol intake: none  Health Risk Assessment: The patient has completed a Health Risk Assessment. This has been reveiwed with them and has been scanned into the [facility] system as an attached document.  Current Medical Providers and Suppliers: [facility] Patient Care Team: Forrest Moron, MD as PCP - General (Internal Medicine) Molli Posey, MD (Obstetrics and Gynecology) [facility], Jennefer Bravo, MD (Dermatology) Gaynelle Arabian, MD (Orthopedic Surgery) [place], [place], [place] (Optometry) Future Appointments  Date Time Provider [place]  11/10/2019  3:00 PM [place] [facility]  04/18/2020  8:20 AM Forrest Moron, MD PCP-PCP [facility]     Age-appropriate Screening Schedule: The list below includes current immunization status and future screening recommendations based on patient's age. Orders for these recommended tests are listed in the plan section. The patient has been provided with a written plan. Immunization History  Administered Date(s) Administered  . Influenza Split 05/29/2012  . Influenza Whole 06/07/2009, 07/06/2010  . Influenza, High Dose Seasonal PF 10/09/2017  . Influenza,inj,Quad PF,6+ Mos 08/03/2013, 06/18/2014, 06/09/2015  . Influenza,inj,quad, With Preservative 08/21/2019  . Influenza-Unspecified 06/10/2016, 08/21/2019  . PFIZER SARS-COV-2 Vaccination 10/16/2019  . Pneumococcal Conjugate-13 09/14/2015  . Pneumococcal Polysaccharide-23 09/19/2016  . Tdap 05/29/2012, 04/15/2017  . Zoster 08/03/2013, 08/21/2019    Health Maintenance reviewed -  patient asked to schedule her mammogram   Depression Screen-PHQ2/9 completed today  Depression screen [facility] 2/9 10/19/2019 04/13/2019 03/19/2019 10/17/2018 09/24/2018  Decreased Interest 0  0 0 1 0  Down, Depressed, Hopeless 0 0 0 1 0  PHQ - 2 Score 0 0 0 2  0  Altered sleeping - - - 3 0  Tired, decreased energy - - - 0 0  Change in appetite - - - 0 0  Feeling bad or failure about yourself  - - - 0 -  Trouble concentrating - - - 0 0  Moving slowly or fidgety/restless - - - 0 0  Suicidal thoughts - - - 0 0  PHQ-9 Score - - - 5 0  Difficult doing work/chores - - - Somewhat difficult -       Depression Severity and Treatment Recommendations:  0-4= None  5-9= Mild / Treatment: Support, educate to call if worse; return in one month  10-14= Moderate / Treatment: Support, watchful waiting; Antidepressant or Psycotherapy  15-19= Moderately severe / Treatment: Antidepressant OR Psychotherapy  >= 20 = Major depression, severe / Antidepressant AND Psychotherapy  Functional Status Survey:   Is the patient deaf or have difficulty hearing?: No Does the patient have difficulty seeing, even when wearing glasses/contacts?: Yes Does the patient have difficulty concentrating, remembering, or making decisions?: Yes Does the patient have difficulty walking or climbing stairs?: No Does the patient have difficulty dressing or bathing?: No Does the patient have difficulty doing errands alone such as visiting a doctor's office or shopping?: No    Advanced Care Planning: 1. Patient has executed an Advance Directive: Yes 2. If no, patient was given the opportunity to execute an Advance Directive today? No 3. Are the patient's advanced directives in [place]? No 4. This patient has the ability to prepare an Advance Directive: Yes 5. Provider is willing to follow the patient's wishes: Yes  Cognitive Assessment: Does the patient have evidence of cognitive impairment? No The patient does not have any evidence of any cognitive problems and denies any  change in mood/affect, appearance, speech, memory or motor skills.  Identification of Risk Factors: Risk factors include: hyperlipidemia  ROS Review of Systems  Constitutional: Negative for activity change,  appetite change, chills and fever.  HENT: Negative for congestion, nosebleeds, trouble swallowing and voice change.   Respiratory: Negative for cough, shortness of breath and wheezing.   Gastrointestinal: Negative for diarrhea, nausea and vomiting.  Genitourinary: Negative for difficulty urinating, dysuria, flank pain and hematuria.  Musculoskeletal: Negative for back pain, joint swelling and neck pain.  Neurological: Negative for dizziness, speech difficulty, light-headedness and numbness.  See HPI. All other review of systems negative.   Objective:   Vitals:   10/19/19 0811  BP: (!) 144/82  Pulse: 80  Temp: 98.3 F (36.8 C)  TempSrc: Temporal  SpO2: 96%  Weight: 163 lb 8 oz (74.2 kg)  Height: '5\' 7"'$  (1.702 m)    Body mass index is 25.61 kg/m.  Physical Exam Constitutional:      Appearance: Normal appearance. She is normal weight.  HENT:     Head: Normocephalic and atraumatic.  Eyes:     Extraocular Movements: Extraocular movements intact.     Conjunctiva/sclera: Conjunctivae normal.  Neck:     Comments: No thyromegaly Cardiovascular:     Rate and Rhythm: Normal rate and regular rhythm.     Heart sounds: No murmur.  Pulmonary:     Effort: Pulmonary effort is normal.     Breath sounds: Normal breath sounds.  Abdominal:     General: Abdomen is flat. Bowel sounds are normal. There is no distension.  Palpations: Abdomen is soft. There is no mass.     Tenderness: There is no abdominal tenderness. There is no guarding or rebound.     Hernia: No hernia is present.  Musculoskeletal:     Cervical back: Normal range of motion and neck supple.  Skin:    General: Skin is warm.     Capillary Refill: Capillary refill takes less than 2 seconds.  Neurological:     Mental Status: She is alert and oriented to person, place, and time.  Psychiatric:        Mood and Affect: Mood normal.        Behavior: Behavior normal.        Thought Content: Thought content normal.         Judgment: Judgment normal.       Assessment/Plan:   Patient Self-Management and Personalized Health Advice The patient has been provided with information about:  attempt to lose weight and use calcium 1 gram daily with Vit D  During the course of the visit the patient was educated and counseled about appropriate screening and preventive services including:    Body mass index is 25.61 kg/m. Discussed the patient's BMI with her. The BMI BMI is in the acceptable range  Roshanda was seen today for [employer].  Diagnoses and all orders for this visit:  Medicare annual wellness visit, subsequent- Women's Health Maintenance Plan Advised monthly breast exam and annual mammogram Advised dental exam every six months Discussed stress management Discussed pap smear screening guidelines   Mixed hyperlipidemia- Discussed medications that affect lipids Reminded patient to avoid grapefruits Reviewed last 3 lipids Discussed current meds: statin, aspirin Advised dietary fiber and fish oil and ways to keep HDL high CAD prevention and reviewed side effects of statins  -     Lipid panel  Essential hypertension- Patient's blood pressure is at goal of 139/89 or less. Condition is stable. Continue current medications and treatment plan. I recommend that you exercise for 30-45 minutes 5 days a week. I also recommend a balanced diet with fruits and vegetables every day, lean meats, and little fried foods. The DASH diet (you can find this online) is a good example of this.  -     CMP14+EGFR -     Lipid panel      Return in about 6 months (around 04/17/2020) for lipid.  Future Appointments  Date Time Provider [place]  11/10/2019  3:00 PM [place] [facility]  04/18/2020  8:20 AM Forrest Moron, MD PCP-PCP [facility]    Patient Instructions     Wt Readings from Last 3 Encounters:  10/19/19 163 lb 8 oz (74.2 kg)  04/13/19 172 lb 3.2 oz (78.1 kg)  03/19/19 174  lb (78.9 kg)     If you have lab work done today you will be contacted with your lab results within the next 2 weeks.  If you have not heard from [place] then please contact us. The fastest way to get your results is to register for My Chart.   IF you received an x-ray today, you will receive an invoice from [facility]. Please contact [facility] at [phone] with questions or concerns regarding your invoice.   IF you received labwork today, you will receive an invoice from [facility]. Please contact [facility] at [phone] with questions or concerns regarding your invoice.   Our billing staff will not be able to assist you with questions regarding bills from these companies.  You will be contacted with the lab results as soon as they are available. The fastest way to get your results is to activate your My Chart account. Instructions are located on the last page of this paperwork. If you have not heard from [place] regarding the results in 2 weeks, please contact this office.        An after visit summary with all of these plans was given to the patient.

## 2019-10-19 NOTE — Patient Instructions (Addendum)
   Wt Readings from Last 3 Encounters:  10/19/19 163 lb 8 oz (74.2 kg)  04/13/19 172 lb 3.2 oz (78.1 kg)  03/19/19 174 lb (78.9 kg)     If you have lab work done today you will be contacted with your lab results within the next 2 weeks.  If you have not heard from [place] then please contact us. The fastest way to get your results is to register for My Chart.   IF you received an x-ray today, you will receive an invoice from [facility]. Please contact [facility] at [phone] with questions or concerns regarding your invoice.   IF you received labwork today, you will receive an invoice from [place]. Please contact [facility] at [phone] with questions or concerns regarding your invoice.   Our billing staff will not be able to assist you with questions regarding bills from these companies.  You will be contacted with the lab results as soon as they are available. The fastest way to get your results is to activate your My Chart account. Instructions are located on the last page of this paperwork. If you have not heard from [place] regarding the results in 2 weeks, please contact this office.

## 2019-10-20 LAB — CMP14+EGFR
ALT: 13 IU/L (ref 0–32)
AST: 18 IU/L (ref 0–40)
Albumin/Globulin Ratio: 2.1 (ref 1.2–2.2)
Albumin: 4.8 g/dL (ref 3.8–4.8)
Alkaline Phosphatase: 79 IU/L (ref 39–117)
BUN/Creatinine Ratio: 18 (ref 12–28)
BUN: 16 mg/dL (ref 8–27)
Bilirubin Total: 0.5 mg/dL (ref 0.0–1.2)
CO2: 24 mmol/L (ref 20–29)
Calcium: 10.1 mg/dL (ref 8.7–10.3)
Chloride: 102 mmol/L (ref 96–106)
Creatinine, Ser: 0.9 mg/dL (ref 0.57–1.00)
GFR calc Af Amer: 75 mL/min/{1.73_m2} (ref >59)
GFR calc non Af Amer: 65 mL/min/{1.73_m2} (ref >59)
Globulin, Total: 2.3 g/dL (ref 1.5–4.5)
Glucose: 101 mg/dL — ABNORMAL HIGH (ref 65–99)
Potassium: 4 mmol/L (ref 3.5–5.2)
Sodium: 144 mmol/L (ref 134–144)
Total Protein: 7.1 g/dL (ref 6.0–8.5)

## 2019-10-20 LAB — LIPID PANEL
Chol/HDL Ratio: 2.6 ratio (ref 0.0–4.4)
Cholesterol, Total: 176 mg/dL (ref 100–199)
HDL: 68 mg/dL (ref >39)
LDL Chol Calc (NIH): 91 mg/dL (ref 0–99)
Triglycerides: 95 mg/dL (ref 0–149)
VLDL Cholesterol Cal: 17 mg/dL (ref 5–40)

## 2019-10-28 ENCOUNTER — Ambulatory Visit: Payer: Medicare Other

## 2019-11-04 DIAGNOSIS — C44622 Squamous cell carcinoma of skin of right upper limb, including shoulder: Secondary | ICD-10-CM | POA: Diagnosis not present

## 2019-11-10 ENCOUNTER — Ambulatory Visit: Payer: Medicare Other | Attending: Internal Medicine

## 2019-11-10 DIAGNOSIS — Z23 Encounter for immunization: Secondary | ICD-10-CM | POA: Insufficient documentation

## 2019-11-10 NOTE — Progress Notes (Signed)
   Covid-19 Vaccination Clinic  Name:  [name]    MRN: [id number] DOB: 03-21-1951  11/10/2019  [name] was observed post Covid-19 immunization for 15 minutes without incident. She was provided with Vaccine Information Sheet and instruction to access the V-Safe system.   [name] was instructed to call 911 with any severe reactions post vaccine: Marland Kitchen Difficulty breathing  . Swelling of face and throat  . A fast heartbeat  . A bad rash all over body  . Dizziness and weakness   Immunizations Administered    Name Date Dose VIS Date Route   Pfizer COVID-19 Vaccine 11/10/2019  2:40 PM 0.3 mL 08/21/2019 Intramuscular   Manufacturer: [place]   Lot: [id number]   [place]: [id number]

## 2020-01-03 ENCOUNTER — Other Ambulatory Visit: Payer: Self-pay | Admitting: Family Medicine

## 2020-01-03 NOTE — Telephone Encounter (Signed)
Requested Prescriptions  Pending Prescriptions Disp Refills  . furosemide (LASIX) 40 MG tablet [Pharmacy Med Name: FUROSEMIDE TABS 40MG ] 90 tablet 1    Sig: TAKE 1 TABLET DAILY     Cardiovascular:  Diuretics - Loop Failed - 01/03/2020  7:30 AM      Failed - Last BP in normal range    BP Readings from Last 1 Encounters:  10/19/19 (!) 144/82         Passed - K in normal range and within 360 days    Potassium  Date Value Ref Range Status  10/19/2019 4.0 3.5 - 5.2 mmol/L Final         Passed - Ca in normal range and within 360 days    Calcium  Date Value Ref Range Status  10/19/2019 10.1 8.7 - 10.3 mg/dL Final   Calcium, Ion  Date Value Ref Range Status  01/23/2012 1.24 1.12 - 1.32 mmol/L Final         Passed - Na in normal range and within 360 days    Sodium  Date Value Ref Range Status  10/19/2019 144 134 - 144 mmol/L Final         Passed - Cr in normal range and within 360 days    Creat  Date Value Ref Range Status  09/14/2015 0.89 0.50 - 0.99 mg/dL Final   Creatinine, Ser  Date Value Ref Range Status  10/19/2019 0.90 0.57 - 1.00 mg/dL Final         Passed - Valid encounter within last 6 months    Recent Outpatient Visits          2 months ago Medicare annual wellness visit, subsequent   Primary Care at [facility], Zoe A, MD   8 months ago Loose stools   Primary Care at [facility], Arlie Solomons, MD   9 months ago Cough   Primary Care at [facility], Ranell Patrick, MD   1 year ago Mixed hyperlipidemia   Primary Care at [facility], Arlie Solomons, MD   1 year ago Abnormal imaging of thyroid   Primary Care at [facility], Lilia Argue, MD      Future Appointments            In 3 months Forrest Moron, MD Primary Care at [facility] [place]           . buPROPion (WELLBUTRIN XL) 300 MG 24 hr tablet [Pharmacy Med Name: BUPROPION HCL XL TABS 300MG ] 90 tablet 1    Sig: TAKE 1 TABLET DAILY     Psychiatry: Antidepressants - bupropion Failed - 01/03/2020  7:30 AM      Failed - Last BP in normal range    BP Readings from Last 1 Encounters:  10/19/19 (!) 144/82         Passed - Completed PHQ-2 or PHQ-9 in the last 360 days.      Passed - Valid encounter within last 6 months    Recent Outpatient Visits          2 months ago Medicare annual wellness visit, subsequent   Primary Care at [facility] [place] A, MD   8 months ago Loose stools   Primary Care at [facility], Arlie Solomons, MD   9 months ago Cough   Primary Care at [facility], Ranell Patrick, MD   1 year ago Mixed hyperlipidemia   Primary Care at [facility], Arlie Solomons, MD   1 year ago Abnormal imaging of thyroid  Primary Care at [facility], Lilia Argue, MD      Future Appointments            In 3 months Forrest Moron, MD Primary Care at [place], [facility]           . atorvastatin (LIPITOR) 20 MG tablet [Pharmacy Med Name: ATORVASTATIN TABS 20MG ] 90 tablet 2    Sig: TAKE 1 TABLET EVERY MORNING     Cardiovascular:  Antilipid - Statins Passed - 01/03/2020  7:30 AM      Passed - Total Cholesterol in normal range and within 360 days    Cholesterol, Total  Date Value Ref Range Status  10/19/2019 176 100 - 199 mg/dL Final         Passed - LDL in normal range and within 360 days    LDL Chol Calc (NIH)  Date Value Ref Range Status  10/19/2019 91 0 - 99 mg/dL Final         Passed - HDL in normal range and within 360 days    HDL  Date Value Ref Range Status  10/19/2019 68 >39 mg/dL Final         Passed - Triglycerides in normal range and within 360 days    Triglycerides  Date Value Ref Range Status  10/19/2019 95 0 - 149 mg/dL Final         Passed - Patient is not pregnant      Passed - Valid encounter within last 12 months    Recent Outpatient Visits          2 months ago Medicare annual wellness visit, subsequent   Primary Care at [facility], Zoe A, MD   8 months ago Loose stools   Primary Care at [facility], Arlie Solomons, MD   9 months ago Cough   Primary Care at  [facility], Ranell Patrick, MD   1 year ago Mixed hyperlipidemia   Primary Care at [facility], Arlie Solomons, MD   1 year ago Abnormal imaging of thyroid   Primary Care at [facility], Lilia Argue, MD      Future Appointments            In 3 months Forrest Moron, MD Primary Care at [place], [facility]

## 2020-01-05 DIAGNOSIS — H5032 Intermittent alternating esotropia: Secondary | ICD-10-CM | POA: Diagnosis not present

## 2020-01-05 DIAGNOSIS — H5021 Vertical strabismus, right eye: Secondary | ICD-10-CM | POA: Diagnosis not present

## 2020-02-03 DIAGNOSIS — D225 Melanocytic nevi of trunk: Secondary | ICD-10-CM | POA: Diagnosis not present

## 2020-02-03 DIAGNOSIS — L821 Other seborrheic keratosis: Secondary | ICD-10-CM | POA: Diagnosis not present

## 2020-02-03 DIAGNOSIS — C44629 Squamous cell carcinoma of skin of left upper limb, including shoulder: Secondary | ICD-10-CM | POA: Diagnosis not present

## 2020-02-03 DIAGNOSIS — W57XXXA Bitten or stung by nonvenomous insect and other nonvenomous arthropods, initial encounter: Secondary | ICD-10-CM | POA: Diagnosis not present

## 2020-02-03 DIAGNOSIS — Z86018 Personal history of other benign neoplasm: Secondary | ICD-10-CM | POA: Diagnosis not present

## 2020-02-03 DIAGNOSIS — D485 Neoplasm of uncertain behavior of skin: Secondary | ICD-10-CM | POA: Diagnosis not present

## 2020-02-03 DIAGNOSIS — L570 Actinic keratosis: Secondary | ICD-10-CM | POA: Diagnosis not present

## 2020-02-03 DIAGNOSIS — C44729 Squamous cell carcinoma of skin of left lower limb, including hip: Secondary | ICD-10-CM | POA: Diagnosis not present

## 2020-02-03 DIAGNOSIS — Z85828 Personal history of other malignant neoplasm of skin: Secondary | ICD-10-CM | POA: Diagnosis not present

## 2020-02-03 DIAGNOSIS — C44722 Squamous cell carcinoma of skin of right lower limb, including hip: Secondary | ICD-10-CM | POA: Diagnosis not present

## 2020-02-03 DIAGNOSIS — L814 Other melanin hyperpigmentation: Secondary | ICD-10-CM | POA: Diagnosis not present

## 2020-02-03 DIAGNOSIS — L578 Other skin changes due to chronic exposure to nonionizing radiation: Secondary | ICD-10-CM | POA: Diagnosis not present

## 2020-03-09 DIAGNOSIS — L905 Scar conditions and fibrosis of skin: Secondary | ICD-10-CM | POA: Diagnosis not present

## 2020-03-09 DIAGNOSIS — C44722 Squamous cell carcinoma of skin of right lower limb, including hip: Secondary | ICD-10-CM | POA: Diagnosis not present

## 2020-03-09 DIAGNOSIS — C44729 Squamous cell carcinoma of skin of left lower limb, including hip: Secondary | ICD-10-CM | POA: Diagnosis not present

## 2020-03-09 DIAGNOSIS — C44629 Squamous cell carcinoma of skin of left upper limb, including shoulder: Secondary | ICD-10-CM | POA: Diagnosis not present

## 2020-03-16 DIAGNOSIS — L570 Actinic keratosis: Secondary | ICD-10-CM | POA: Diagnosis not present

## 2020-03-16 DIAGNOSIS — D0472 Carcinoma in situ of skin of left lower limb, including hip: Secondary | ICD-10-CM | POA: Diagnosis not present

## 2020-03-16 DIAGNOSIS — D485 Neoplasm of uncertain behavior of skin: Secondary | ICD-10-CM | POA: Diagnosis not present

## 2020-03-16 DIAGNOSIS — C44519 Basal cell carcinoma of skin of other part of trunk: Secondary | ICD-10-CM | POA: Diagnosis not present

## 2020-03-25 DIAGNOSIS — L905 Scar conditions and fibrosis of skin: Secondary | ICD-10-CM | POA: Diagnosis not present

## 2020-03-25 DIAGNOSIS — C44519 Basal cell carcinoma of skin of other part of trunk: Secondary | ICD-10-CM | POA: Diagnosis not present

## 2020-03-25 DIAGNOSIS — D0472 Carcinoma in situ of skin of left lower limb, including hip: Secondary | ICD-10-CM | POA: Diagnosis not present

## 2020-04-11 DIAGNOSIS — H532 Diplopia: Secondary | ICD-10-CM | POA: Diagnosis not present

## 2020-04-11 DIAGNOSIS — H2513 Age-related nuclear cataract, bilateral: Secondary | ICD-10-CM | POA: Diagnosis not present

## 2020-04-18 ENCOUNTER — Ambulatory Visit: Payer: Medicare Other | Admitting: Family Medicine

## 2020-05-03 DIAGNOSIS — Z20822 Contact with and (suspected) exposure to covid-19: Secondary | ICD-10-CM | POA: Diagnosis not present

## 2020-05-27 ENCOUNTER — Ambulatory Visit (INDEPENDENT_AMBULATORY_CARE_PROVIDER_SITE_OTHER): Payer: Medicare Other | Admitting: Registered Nurse

## 2020-05-27 ENCOUNTER — Other Ambulatory Visit: Payer: Self-pay

## 2020-05-27 ENCOUNTER — Encounter: Payer: Self-pay | Admitting: Registered Nurse

## 2020-05-27 VITALS — BP 128/78 | HR 78 | Temp 97.9°F | Resp 18 | Ht 67.0 in | Wt 163.6 lb

## 2020-05-27 DIAGNOSIS — E782 Mixed hyperlipidemia: Secondary | ICD-10-CM

## 2020-05-27 DIAGNOSIS — I10 Essential (primary) hypertension: Secondary | ICD-10-CM

## 2020-05-27 DIAGNOSIS — Z23 Encounter for immunization: Secondary | ICD-10-CM

## 2020-05-27 DIAGNOSIS — Z760 Encounter for issue of repeat prescription: Secondary | ICD-10-CM | POA: Diagnosis not present

## 2020-05-27 MED ORDER — ATORVASTATIN CALCIUM 20 MG PO TABS: 20.0000 mg | ORAL_TABLET | Freq: Every morning | ORAL | 3 refills | Status: DC

## 2020-05-27 MED ORDER — BUPROPION HCL ER (XL) 300 MG PO TB24: 300.0000 mg | ORAL_TABLET | Freq: Every day | ORAL | 1 refills | Status: DC

## 2020-05-27 MED ORDER — FUROSEMIDE 40 MG PO TABS: 40.0000 mg | ORAL_TABLET | Freq: Every day | ORAL | 1 refills | Status: DC

## 2020-05-27 NOTE — Patient Instructions (Signed)
° ° ° °  If you have lab work done today you will be contacted with your lab results within the next 2 weeks.  If you have not heard from us then please contact us. The fastest way to get your results is to register for My Chart. ° ° °IF you received an x-ray today, you will receive an invoice from [facility]. Please contact [facility] at [phone] with questions or concerns regarding your invoice.  ° °IF you received labwork today, you will receive an invoice from [facility]. Please contact [facility] at [phone] with questions or concerns regarding your invoice.  ° °Our billing staff will not be able to assist you with questions regarding bills from these companies. ° °You will be contacted with the lab results as soon as they are available. The fastest way to get your results is to activate your My Chart account. Instructions are located on the last page of this paperwork. If you have not heard from us regarding the results in 2 weeks, please contact this office. °  ° ° ° °

## 2020-05-27 NOTE — Progress Notes (Signed)
Established Patient Office Visit  Subjective:  Patient ID: [name], female    DOB: [birth year]  Age: 69 y.o. MRN: [id number]  CC:  Chief Complaint  Patient presents with  . Medication Refill    PAtient states she is here for an medication refill. Patient staes she has no other concerns    HPI [name] presents for med refill  HTN: taking lasix 40mg  PO qd. Good effect. No sxs of K imbalance. No cv symptoms  HLD: taking atorvastatin 20mg  PO qd. Good effect. No AEs  Depression/anxiety: bupropion 300mg  PO qd. Good effect. Feeling well. Wants to stay on this dose.  No other complaints or concerns today. Feeling well overall.   Past Medical History:  Diagnosis Date  . Arthritis    knees, hands  . Bursitis of left hip   . Cancer ([place])    hx skin cancer  . Depression   . GERD (gastroesophageal reflux disease)    INFREQUENT  . Headache    HX MIGRAINES   . Heart murmur     MVP 30 yrs ago - no treatment needed  . Hyperlipidemia    on statin  . Hypertension    "flucuations" has never been on any med  . Insomnia   . Irritable bowel syndrome    s/p subtotal colectomy '86>diarrhea prone  . NEOPLASM, MALIGNANT, BASAL CELL, CARCINOMA, HX OF   . Seasonal allergies     Past Surgical History:  Procedure Laterality Date  . ABDOMINOPLASTY    . ANTERIOR CERVICAL DECOMP/DISCECTOMY FUSION N/A 02/14/2018   Procedure: CERVICAL THREE-FOUR,CERVICAL FOUR-FIVE ACDF, REMOVAL CERVICAL FIVE-SEVEN  PLATE.;  Surgeon: Eustace Moore, MD;  Location: [facility];  Service: Neurosurgery;  Laterality: N/A;  anterior  . [place]   lower back  . BREAST ENHANCEMENT SURGERY  1983  . BREAST IMPLANT REMOVAL  08/2010  . BREAST SURGERY     Augmentation in 1983, Reduction 2011  . BUNIONECTOMY     right  and left foot  . CERVICAL SPINE SURGERY  12/18/1999   C5/6 and C6/7  . COLECTOMY  1986  . COSMETIC SURGERY    . EXCISION/RELEASE BURSA HIP Left 07/04/2015   Procedure:  LEFT HIP BURSECTOMY WITH ;  Surgeon: Gaynelle Arabian, MD;  Location: [facility];  Service: Orthopedics;  Laterality: Left;  . EYE SURGERY     Lasik 2010  . eyelid surgery    . FRACTURE SURGERY    . HYSTEROSCOPY WITH D & C  08/17/2011   Procedure: DILATATION AND CURETTAGE (D&C) /HYSTEROSCOPY;  Surgeon: Margarette Asal;  Location: [facility];  Service: Gynecology;  Laterality: N/A;  . INJECTION KNEE Right 07/04/2015   Procedure: CORTISONE INJECTION RIGHT KNEE ;  Surgeon: Gaynelle Arabian, MD;  Location: [facility];  Service: Orthopedics;  Laterality: Right;  . KNEE ARTHROSCOPY     right x 2  . KNEE RECONSTRUCTION, MEDIAL PATELLAR FEMORAL LIGAMENT     right  . LASIK  2009    bilateral  . left foot surgery     removed foreign object  . OPEN SURGICAL REPAIR OF GLUTEAL TENDON Left 07/04/2015   Procedure: GLUTEAL TENDON REPAIR ;  Surgeon: Gaynelle Arabian, MD;  Location: [facility];  Service: Orthopedics;  Laterality: Left;  . REDUCTION MAMMAPLASTY Bilateral   . thumb surgery    . TOTAL KNEE ARTHROPLASTY Right 06/17/2017   Procedure: RIGHT TOTAL KNEE ARTHROPLASTY;  Surgeon: Gaynelle Arabian, MD;  Location: [facility];  Service:  Orthopedics;  Laterality: Right;  . TUBAL LIGATION      Family History  Problem Relation Age of Onset  . Hypertension Mother   . Heart disease Mother   . Stomach cancer Father 43  . Cancer Father        stomach and liver cancer  . Diabetes Brother   . Heart disease Brother   . Hypertension Brother   . Cancer Daughter        breast cancer  . Hypertension Daughter   . Hypertension Brother   . Heart disease Brother   . Thyroid disease Sister   . Breast cancer Sister   . Hypertension Sister   . Psoriasis Sister   . High Cholesterol Sister   . Neuropathy Sister   . Colon cancer Neg Hx     Social History   Socioeconomic History  . Marital status: Married    Spouse name: Not on file  . Number of children: 2  . Years of education: Not on file  . Highest education level: Some  college, no degree  Occupational History  . Not on file  Tobacco Use  . Smoking status: Never Smoker  . Smokeless tobacco: Never Used  . 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 Vaping Use: Never used  Substance and Sexual Activity  . Alcohol use: Yes    Comment: occasional/ 2 drinks a month  . Drug use: No  . Sexual activity: Yes    Birth control/protection: Post-menopausal, Surgical  Other Topics Concern  . Not on file  Social History Narrative   Lives at home with husband.  Retired.  Education some college.  2 children.  Caffeine 3-4 glasses of tea/ day.   Social Determinants of Health   Financial Resource Strain:   . Difficulty of Paying Living Expenses: Not on file  Food Insecurity:   . Worried About Charity fundraiser in the Last Year: Not on file  . Ran Out of Food in the Last Year: Not on file  Transportation Needs:   . Lack of Transportation (Medical): Not on file  . Lack of Transportation (Non-Medical): Not on file  Physical Activity:   . Days of Exercise per Week: Not on file  . Minutes of Exercise per Session: Not on file  Stress:   . Feeling of Stress : Not on file  Social Connections:   . Frequency of Communication with Friends and Family: Not on file  . Frequency of Social Gatherings with Friends and Family: Not on file  . Attends Religious Services: Not on file  . Active Member of Clubs or Organizations: Not on file  . Attends Archivist Meetings: Not on file  . Marital Status: Not on file  Intimate Partner Violence:   . Fear of Current or Ex-Partner: Not on file  . Emotionally Abused: Not on file  . Physically Abused: Not on file  . Sexually Abused: Not on file    Outpatient Medications Prior to Visit  Medication Sig Dispense Refill  . acetaminophen (TYLENOL) 500 MG tablet Take 1,000 mg by mouth every 8 (eight) hours as needed for moderate pain or  headache.     . Calcium-Phosphorus-Vitamin D (CITRACAL +D3 PO) Take 1,000 Units by mouth daily.     . cholecalciferol (VITAMIN D) 1000 units tablet Take 1,000 Units by mouth daily.    . Cyanocobalamin (VITAMIN B12)  1000 MCG TBCR Take 1,000 mcg by mouth daily.    . diclofenac sodium (VOLTAREN) 1 % GEL Apply 1 g topically daily as needed.     . famotidine (PEPCID) 10 MG tablet Take 10 mg by mouth 2 (two) times daily.    . fluorouracil (EFUDEX) 5 % cream Apply 1 application topically See admin instructions. In January applies daily for 3 weeks then off for 1-3 weeks then resume for 3 weeks. "For skin cancers"  0  . Glucosamine-Chondroitin-MSM (TRIPLE FLEX PO) Take by mouth.    . Liniments (SALONPAS PAIN RELIEF PATCH EX) Apply 2 patches topically daily.    Marland Kitchen loperamide (IMODIUM A-D) 2 MG tablet Take 1 tablet (2 mg total) by mouth 3 (three) times daily as needed for diarrhea or loose stools. 90 tablet 3  . loratadine (CLARITIN) 10 MG tablet Take 10 mg by mouth daily.     . Magnesium 300 MG CAPS Take 300 mg by mouth daily.     . niacinamide 500 MG tablet Take 500 mg by mouth 2 (two) times daily with a meal.    . Simethicone (PHAZYME PO) Take 1 tablet by mouth every morning.     . traZODone (DESYREL) 50 MG tablet trazodone 50 mg tablet    . atorvastatin (LIPITOR) 20 MG tablet TAKE 1 TABLET EVERY MORNING 90 tablet 2  . buPROPion (WELLBUTRIN XL) 300 MG 24 hr tablet TAKE 1 TABLET DAILY 90 tablet 1  . furosemide (LASIX) 40 MG tablet TAKE 1 TABLET DAILY 90 tablet 1   No facility-administered medications prior to visit.    Allergies  Allergen Reactions  . Nsaids Other (See Comments)    Hurts stomach    ROS Review of Systems  Constitutional: Negative.   HENT: Negative.   Eyes: Negative.   Respiratory: Negative.   Cardiovascular: Negative.   Gastrointestinal: Negative.   Genitourinary: Negative.   Musculoskeletal: Negative.   Skin: Negative.   Neurological: Negative.    Psychiatric/Behavioral: Negative.       Objective:    Physical Exam Vitals and nursing note reviewed.  Constitutional:      General: She is not in acute distress.    Appearance: Normal appearance. She is normal weight. She is not ill-appearing, toxic-appearing or diaphoretic.  Cardiovascular:     Rate and Rhythm: Normal rate and regular rhythm.     Heart sounds: Normal heart sounds. No murmur heard.  No friction rub. No gallop.   Pulmonary:     Effort: Pulmonary effort is normal. No respiratory distress.     Breath sounds: Normal breath sounds. No stridor. No wheezing, rhonchi or rales.  Chest:     Chest wall: No tenderness.  Skin:    General: Skin is warm and dry.  Neurological:     General: No focal deficit present.     Mental Status: She is alert and oriented to person, place, and time. Mental status is at baseline.  Psychiatric:        Mood and Affect: Mood normal.        Behavior: Behavior normal.        Thought Content: Thought content normal.        Judgment: Judgment normal.     BP (!) 147/89   Pulse 78   Temp 97.9 F (36.6 C) (Temporal)   Resp 18   Ht 5\' 7"  (1.702 m)   Wt 163 lb 9.6 oz (74.2 kg)   SpO2 98%   BMI 25.62 kg/m  Wt Readings from Last 3 Encounters:  05/27/20 163 lb 9.6 oz (74.2 kg)  10/19/19 163 lb 8 oz (74.2 kg)  04/13/19 172 lb 3.2 oz (78.1 kg)     Health Maintenance Due  Topic Date Due  . INFLUENZA VACCINE  04/10/2020    There are no preventive care reminders to display for this patient.  Lab Results  Component Value Date   TSH 1.940 10/13/2018   Lab Results  Component Value Date   WBC 7.1 01/31/2018   HGB 15.8 (H) 01/31/2018   HCT 48.5 (H) 01/31/2018   MCV 92.2 01/31/2018   PLT 275 01/31/2018   Lab Results  Component Value Date   NA 144 10/19/2019   K 4.0 10/19/2019   CO2 24 10/19/2019   GLUCOSE 101 (H) 10/19/2019   BUN 16 10/19/2019   CREATININE 0.90 10/19/2019   BILITOT 0.5 10/19/2019   ALKPHOS 79 10/19/2019    AST 18 10/19/2019   ALT 13 10/19/2019   PROT 7.1 10/19/2019   ALBUMIN 4.8 10/19/2019   CALCIUM 10.1 10/19/2019   ANIONGAP 12 01/31/2018   GFR 66.90 11/17/2014   Lab Results  Component Value Date   CHOL 176 10/19/2019   Lab Results  Component Value Date   HDL 68 10/19/2019   Lab Results  Component Value Date   LDLCALC 91 10/19/2019   Lab Results  Component Value Date   TRIG 95 10/19/2019   Lab Results  Component Value Date   CHOLHDL 2.6 10/19/2019   Lab Results  Component Value Date   HGBA1C 5.7 05/31/2009      Assessment & Plan:   Problem List Items Addressed This Visit      Cardiovascular and Mediastinum   Essential hypertension   Relevant Medications   atorvastatin (LIPITOR) 20 MG tablet   furosemide (LASIX) 40 MG tablet   Other Relevant Orders   Comprehensive metabolic panel     Other   Hyperlipidemia - Primary   Relevant Medications   atorvastatin (LIPITOR) 20 MG tablet   furosemide (LASIX) 40 MG tablet   Other Relevant Orders   Lipid panel    Other Visit Diagnoses    Medication refill       Relevant Medications   buPROPion (WELLBUTRIN XL) 300 MG 24 hr tablet   Flu vaccine need       Relevant Orders   Flu Vaccine QUAD High Dose(Fluad)      Meds ordered this encounter  Medications  . atorvastatin (LIPITOR) 20 MG tablet    Sig: Take 1 tablet (20 mg total) by mouth every morning.    Dispense:  90 tablet    Refill:  3  . furosemide (LASIX) 40 MG tablet    Sig: Take 1 tablet (40 mg total) by mouth daily.    Dispense:  90 tablet    Refill:  1  . buPROPion (WELLBUTRIN XL) 300 MG 24 hr tablet    Sig: Take 1 tablet (300 mg total) by mouth daily.    Dispense:  90 tablet    Refill:  1    Follow-up: No follow-ups on file.   PLAN  Refill meds as above  Follow up with TOC with Dr. Carlota Raspberry in January 2022.   Labs collected, will follow up as warranted  Patient encouraged to call clinic with any questions, comments, or concerns.  Maximiano Coss, NP

## 2020-05-28 ENCOUNTER — Encounter: Payer: Self-pay | Admitting: Registered Nurse

## 2020-05-28 LAB — COMPREHENSIVE METABOLIC PANEL
ALT: 15 IU/L (ref 0–32)
AST: 19 IU/L (ref 0–40)
Albumin/Globulin Ratio: 2.7 — ABNORMAL HIGH (ref 1.2–2.2)
Albumin: 5.2 g/dL — ABNORMAL HIGH (ref 3.8–4.8)
Alkaline Phosphatase: 69 IU/L (ref 44–121)
BUN/Creatinine Ratio: 18 (ref 12–28)
BUN: 15 mg/dL (ref 8–27)
Bilirubin Total: 0.5 mg/dL (ref 0.0–1.2)
CO2: 25 mmol/L (ref 20–29)
Calcium: 9.9 mg/dL (ref 8.7–10.3)
Chloride: 101 mmol/L (ref 96–106)
Creatinine, Ser: 0.82 mg/dL (ref 0.57–1.00)
GFR calc Af Amer: 84 mL/min/{1.73_m2} (ref >59)
GFR calc non Af Amer: 73 mL/min/{1.73_m2} (ref >59)
Globulin, Total: 1.9 g/dL (ref 1.5–4.5)
Glucose: 102 mg/dL — ABNORMAL HIGH (ref 65–99)
Potassium: 4.7 mmol/L (ref 3.5–5.2)
Sodium: 141 mmol/L (ref 134–144)
Total Protein: 7.1 g/dL (ref 6.0–8.5)

## 2020-05-28 LAB — LIPID PANEL
Chol/HDL Ratio: 2.9 ratio (ref 0.0–4.4)
Cholesterol, Total: 168 mg/dL (ref 100–199)
HDL: 58 mg/dL (ref >39)
LDL Chol Calc (NIH): 89 mg/dL (ref 0–99)
Triglycerides: 120 mg/dL (ref 0–149)
VLDL Cholesterol Cal: 21 mg/dL (ref 5–40)

## 2020-05-31 DIAGNOSIS — M5136 Other intervertebral disc degeneration, lumbar region: Secondary | ICD-10-CM | POA: Diagnosis not present

## 2020-06-10 DEATH — deceased

## 2020-07-11 DIAGNOSIS — M961 Postlaminectomy syndrome, not elsewhere classified: Secondary | ICD-10-CM | POA: Diagnosis not present

## 2020-07-14 DIAGNOSIS — M65331 Trigger finger, right middle finger: Secondary | ICD-10-CM | POA: Diagnosis not present

## 2020-07-18 DIAGNOSIS — H532 Diplopia: Secondary | ICD-10-CM | POA: Diagnosis not present

## 2020-08-03 DIAGNOSIS — Z85828 Personal history of other malignant neoplasm of skin: Secondary | ICD-10-CM | POA: Diagnosis not present

## 2020-08-03 DIAGNOSIS — Z86018 Personal history of other benign neoplasm: Secondary | ICD-10-CM | POA: Diagnosis not present

## 2020-08-03 DIAGNOSIS — L814 Other melanin hyperpigmentation: Secondary | ICD-10-CM | POA: Diagnosis not present

## 2020-08-03 DIAGNOSIS — C44722 Squamous cell carcinoma of skin of right lower limb, including hip: Secondary | ICD-10-CM | POA: Diagnosis not present

## 2020-08-03 DIAGNOSIS — L578 Other skin changes due to chronic exposure to nonionizing radiation: Secondary | ICD-10-CM | POA: Diagnosis not present

## 2020-08-03 DIAGNOSIS — L821 Other seborrheic keratosis: Secondary | ICD-10-CM | POA: Diagnosis not present

## 2020-08-03 DIAGNOSIS — D225 Melanocytic nevi of trunk: Secondary | ICD-10-CM | POA: Diagnosis not present

## 2020-08-03 DIAGNOSIS — D485 Neoplasm of uncertain behavior of skin: Secondary | ICD-10-CM | POA: Diagnosis not present

## 2020-08-03 DIAGNOSIS — D0462 Carcinoma in situ of skin of left upper limb, including shoulder: Secondary | ICD-10-CM | POA: Diagnosis not present

## 2020-08-03 DIAGNOSIS — L570 Actinic keratosis: Secondary | ICD-10-CM | POA: Diagnosis not present

## 2020-08-18 DIAGNOSIS — H532 Diplopia: Secondary | ICD-10-CM | POA: Diagnosis not present

## 2020-08-18 DIAGNOSIS — M5416 Radiculopathy, lumbar region: Secondary | ICD-10-CM | POA: Diagnosis not present

## 2020-09-12 ENCOUNTER — Telehealth: Payer: Medicare Other | Admitting: Family Medicine

## 2020-09-12 ENCOUNTER — Other Ambulatory Visit: Payer: Self-pay

## 2020-09-19 DIAGNOSIS — Z8616 Personal history of COVID-19: Secondary | ICD-10-CM | POA: Diagnosis not present

## 2020-10-10 DIAGNOSIS — H532 Diplopia: Secondary | ICD-10-CM | POA: Diagnosis not present

## 2020-10-31 DIAGNOSIS — L905 Scar conditions and fibrosis of skin: Secondary | ICD-10-CM | POA: Diagnosis not present

## 2020-10-31 DIAGNOSIS — Z08 Encounter for follow-up examination after completed treatment for malignant neoplasm: Secondary | ICD-10-CM | POA: Diagnosis not present

## 2020-10-31 DIAGNOSIS — C44722 Squamous cell carcinoma of skin of right lower limb, including hip: Secondary | ICD-10-CM | POA: Diagnosis not present

## 2020-11-01 DIAGNOSIS — M65331 Trigger finger, right middle finger: Secondary | ICD-10-CM | POA: Diagnosis not present

## 2020-11-10 DIAGNOSIS — H532 Diplopia: Secondary | ICD-10-CM | POA: Diagnosis not present

## 2020-12-05 ENCOUNTER — Other Ambulatory Visit: Payer: Self-pay | Admitting: Registered Nurse

## 2020-12-05 DIAGNOSIS — Z760 Encounter for issue of repeat prescription: Secondary | ICD-10-CM

## 2020-12-05 DIAGNOSIS — I10 Essential (primary) hypertension: Secondary | ICD-10-CM

## 2020-12-05 NOTE — Telephone Encounter (Signed)
Pt requesting refills before appt 01/2021 with greene

## 2020-12-07 DIAGNOSIS — Z23 Encounter for immunization: Secondary | ICD-10-CM | POA: Diagnosis not present

## 2020-12-15 ENCOUNTER — Encounter: Payer: Self-pay | Admitting: Family Medicine

## 2021-01-10 ENCOUNTER — Other Ambulatory Visit: Payer: Self-pay

## 2021-01-11 ENCOUNTER — Encounter: Payer: Self-pay | Admitting: Family Medicine

## 2021-01-11 ENCOUNTER — Ambulatory Visit (INDEPENDENT_AMBULATORY_CARE_PROVIDER_SITE_OTHER): Payer: Medicare Other | Admitting: Family Medicine

## 2021-01-11 VITALS — BP 128/78 | HR 67 | Temp 97.2°F | Ht 67.0 in

## 2021-01-11 DIAGNOSIS — E2839 Other primary ovarian failure: Secondary | ICD-10-CM

## 2021-01-11 DIAGNOSIS — Z760 Encounter for issue of repeat prescription: Secondary | ICD-10-CM

## 2021-01-11 DIAGNOSIS — Z8669 Personal history of other diseases of the nervous system and sense organs: Secondary | ICD-10-CM | POA: Diagnosis not present

## 2021-01-11 DIAGNOSIS — E782 Mixed hyperlipidemia: Secondary | ICD-10-CM | POA: Diagnosis not present

## 2021-01-11 DIAGNOSIS — Z87898 Personal history of other specified conditions: Secondary | ICD-10-CM

## 2021-01-11 DIAGNOSIS — M19049 Primary osteoarthritis, unspecified hand: Secondary | ICD-10-CM

## 2021-01-11 DIAGNOSIS — Z8249 Family history of ischemic heart disease and other diseases of the circulatory system: Secondary | ICD-10-CM

## 2021-01-11 DIAGNOSIS — I10 Essential (primary) hypertension: Secondary | ICD-10-CM

## 2021-01-11 MED ORDER — FUROSEMIDE 40 MG PO TABS: 40.0000 mg | ORAL_TABLET | Freq: Every day | ORAL | 1 refills | Status: DC

## 2021-01-11 MED ORDER — ATORVASTATIN CALCIUM 20 MG PO TABS: 20.0000 mg | ORAL_TABLET | Freq: Every morning | ORAL | 3 refills | Status: DC

## 2021-01-11 MED ORDER — PROMETHAZINE HCL 25 MG PO TABS: 25.0000 mg | ORAL_TABLET | Freq: Four times a day (QID) | ORAL | 1 refills | Status: DC | PRN

## 2021-01-11 NOTE — Progress Notes (Signed)
Subjective:  Patient ID: [name], female    DOB: 02-28-1951  Age: 70 y.o. MRN: [id number]  CC:  Chief Complaint  Patient presents with  . Transitions Of Care    Refills    HPI [name] presents for   transition of care and med refills.  Prior PCP Dr. Nolon Rod.  followed by dermatology for hx of Actinic Keratosis - Dr. Renda Rolls OBGYN - Dr. Matthew Saras - no recent visit. Optometry - Dr. Valetta Close  Ortho - Dr. Wynelle Link, Ramos (back injections), Caralyn Guile (hand) - has trigger finger, arthritis in hands. Can't take antiinflammatories due to GI upset/bleeding. Has used voltaren gel sparingly - unsure if relief. Tried glucosamine a long time ago - not recent.  Neurosurgery - Dr. Ronnald Ramp - 2 cervical fusions.   Hypertension: Noted on problem list - white coat component. Has taken lasix 40mg  qd (weight gain or swelling off med). No known hx of CHF. ? MVP years ago. No recent echo No CP, palpitations.  Mom with MI at 22yo. No personal hx of CAD known.  Playing golf few days per week. No dyspnea.  Dizzy if sitting up quickly. No syncope. Past year.  Sister goes to cardiologist.  Home readings: BP Readings from Last 3 Encounters:  01/11/21 128/78  05/27/20 128/78  10/19/19 (!) 144/82   Lab Results  Component Value Date   CREATININE 0.82 05/27/2020   IBS: Hx of partial colectomy, episodic diarrhea.  immdium QAM - controlling symptoms. Regular BM's.  Rare hydrocodone.   Hyperlipidemia: lipitor 20mg  qd.  Lab Results  Component Value Date   CHOL 168 05/27/2020   HDL 58 05/27/2020   LDLCALC 89 05/27/2020   TRIG 120 05/27/2020   CHOLHDL 2.9 05/27/2020   Lab Results  Component Value Date   ALT 15 05/27/2020   AST 19 05/27/2020   ALKPHOS 69 05/27/2020   BILITOT 0.5 05/27/2020    Depression: wellbutrin XL 300mg  qd. Working well. Irritability off med. Better on med.   Depression screen [facility] 2/9 01/11/2021 05/27/2020 10/19/2019 04/13/2019 03/19/2019  Decreased  Interest 0 0 0 0 0  Down, Depressed, Hopeless 0 0 0 0 0  PHQ - 2 Score 0 0 0 0 0  Altered sleeping - - - - -  Tired, decreased energy - - - - -  Change in appetite - - - - -  Feeling bad or failure about yourself  - - - - -  Trouble concentrating - - - - -  Moving slowly or fidgety/restless - - - - -  Suicidal thoughts - - - - -  PHQ-9 Score - - - - -  Difficult doing work/chores - - - - -    Migraine HA Took imitrex injections in past. Had to stop for eye surgery - 10 years ago. Rebound headaches, but better. Now only occasional HA - once per month. Takes 1/4-1/2 phenergan, and helps to resolve HA with sleep. Some nausea with HA that helped with phenergan.   HM: Due for BMD.  Takes citracal. Prior testing ok at GYN - about 5 yrs ago.   History Patient Active Problem List   Diagnosis Date Noted  . Cervical vertebral fusion 02/14/2018  . OA (osteoarthritis) of knee 06/17/2017  . Trochanteric bursitis of left hip 07/03/2015  . Routine general medical examination at a health care facility 11/18/2014  . CARCINOMA, SKIN, SQUAMOUS CELL 07/06/2010  . ACTINIC KERATOSIS 07/06/2010  . NEOPLASM, MALIGNANT, BASAL CELL, CARCINOMA, HX OF 07/06/2010  .  Hyperlipidemia 06/08/2009  . DEPRESSION 06/08/2009  . POSTMENOPAUSAL STATUS 06/08/2009  . IRRITABLE BOWEL SYNDROME 02/17/2008  . BACK PAIN, CHRONIC 02/17/2008  . MIGRAINE HEADACHE 02/05/2007  . Essential hypertension 02/05/2007   Past Medical History:  Diagnosis Date  . Arthritis    knees, hands  . Bursitis of left hip   . Cancer ([place])    hx skin cancer  . Cataract    Phreesia 09/11/2020  . Depression   . Depression    Phreesia 09/11/2020  . GERD (gastroesophageal reflux disease)    INFREQUENT  . Headache    HX MIGRAINES   . Heart murmur     MVP 30 yrs ago - no treatment needed  . Hyperlipidemia    on statin  . Hypertension    "flucuations" has never been on any med  . Insomnia   . Irritable bowel syndrome    s/p  subtotal colectomy '86>diarrhea prone  . NEOPLASM, MALIGNANT, BASAL CELL, CARCINOMA, HX OF   . Seasonal allergies    Past Surgical History:  Procedure Laterality Date  . ABDOMINOPLASTY    . ANTERIOR CERVICAL DECOMP/DISCECTOMY FUSION N/A 02/14/2018   Procedure: CERVICAL THREE-FOUR,CERVICAL FOUR-FIVE ACDF, REMOVAL CERVICAL FIVE-SEVEN  PLATE.;  Surgeon: Eustace Moore, MD;  Location: [facility];  Service: Neurosurgery;  Laterality: N/A;  anterior  . [place]   lower back  . BREAST ENHANCEMENT SURGERY  1983  . BREAST IMPLANT REMOVAL  08/2010  . BREAST SURGERY     Augmentation in 1983, Reduction 2011  . BUNIONECTOMY     right  and left foot  . CERVICAL SPINE SURGERY  12/18/1999   C5/6 and C6/7  . CHOLECYSTECTOMY N/A    Phreesia 09/11/2020  . COLECTOMY  1986  . COLON SURGERY N/A    Phreesia 09/11/2020  . COSMETIC SURGERY    . EXCISION/RELEASE BURSA HIP Left 07/04/2015   Procedure: LEFT HIP BURSECTOMY WITH ;  Surgeon: Gaynelle Arabian, MD;  Location: [facility];  Service: Orthopedics;  Laterality: Left;  . EYE SURGERY     Lasik 2010  . eyelid surgery    . FRACTURE SURGERY    . HYSTEROSCOPY WITH D & C  08/17/2011   Procedure: DILATATION AND CURETTAGE (D&C) /HYSTEROSCOPY;  Surgeon: Margarette Asal;  Location: [facility];  Service: Gynecology;  Laterality: N/A;  . INJECTION KNEE Right 07/04/2015   Procedure: CORTISONE INJECTION RIGHT KNEE ;  Surgeon: Gaynelle Arabian, MD;  Location: [facility];  Service: Orthopedics;  Laterality: Right;  . JOINT REPLACEMENT N/A    Phreesia 09/11/2020  . KNEE ARTHROSCOPY     right x 2  . KNEE RECONSTRUCTION, MEDIAL PATELLAR FEMORAL LIGAMENT     right  . LASIK  2009    bilateral  . left foot surgery     removed foreign object  . OPEN SURGICAL REPAIR OF GLUTEAL TENDON Left 07/04/2015   Procedure: GLUTEAL TENDON REPAIR ;  Surgeon: Gaynelle Arabian, MD;  Location: [facility];  Service: Orthopedics;  Laterality: Left;  . REDUCTION MAMMAPLASTY Bilateral   . SPINE SURGERY N/A     Phreesia 09/11/2020  . thumb surgery    . TOTAL KNEE ARTHROPLASTY Right 06/17/2017   Procedure: RIGHT TOTAL KNEE ARTHROPLASTY;  Surgeon: Gaynelle Arabian, MD;  Location: [facility];  Service: Orthopedics;  Laterality: Right;  . TUBAL LIGATION     Allergies  Allergen Reactions  . Nsaids Other (See Comments)    Hurts stomach  . Other    Prior  to Admission medications   Medication Sig Start Date End Date Taking? Authorizing Provider  acetaminophen (TYLENOL) 500 MG tablet Take 1,000 mg by mouth every 8 (eight) hours as needed for moderate pain or headache.    Yes [provider]  atorvastatin (LIPITOR) 20 MG tablet Take 1 tablet (20 mg total) by mouth every morning. 05/27/20  Yes Maximiano Coss, NP  buPROPion (WELLBUTRIN XL) 300 MG 24 hr tablet TAKE 1 TABLET DAILY 12/05/20  Yes Maximiano Coss, NP  Calcium-Phosphorus-Vitamin D (CITRACAL +D3 PO) Take 1,000 Units by mouth daily.    Yes [provider]  cholecalciferol (VITAMIN D) 1000 units tablet Take 1,000 Units by mouth daily.   Yes [provider]  Cyanocobalamin (VITAMIN B12) 1000 MCG TBCR Take 1,000 mcg by mouth daily.   Yes [provider]  diclofenac sodium (VOLTAREN) 1 % GEL Apply 1 g topically daily as needed.    Yes [provider]  fluorouracil (EFUDEX) 5 % cream Apply 1 application topically See admin instructions. In January applies daily for 3 weeks then off for 1-3 weeks then resume for 3 weeks. "For skin cancers" 05/31/15  Yes [provider]  furosemide (LASIX) 40 MG tablet TAKE 1 TABLET DAILY 12/05/20  Yes Maximiano Coss, NP  HYDROcodone-acetaminophen (NORCO/VICODIN) 5-325 MG tablet Take 1 tablet by mouth every 6 (six) hours as needed. 11/08/20  Yes [provider]  Liniments (SALONPAS PAIN RELIEF PATCH EX) Apply 2 patches topically daily.   Yes [provider]  loperamide (IMODIUM A-D) 2 MG tablet Take 1 tablet (2 mg total) by mouth 3 (three) times daily as needed  for diarrhea or loose stools. 04/13/19  Yes Stallings, Zoe A, MD  loratadine (CLARITIN) 10 MG tablet Take 10 mg by mouth daily.   Yes [provider]  MAGNESIUM GLYCINATE PO Take 450 mg by mouth.   Yes [provider]  niacinamide 500 MG tablet Take 500 mg by mouth 2 (two) times daily with a meal.   Yes [provider]  promethazine (PHENERGAN) 25 MG tablet Take 25 mg by mouth every 6 (six) hours as needed for nausea or vomiting.   Yes [provider]  Simethicone (PHAZYME PO) Take 1 tablet by mouth every morning.    Yes [provider]  Magnesium 300 MG CAPS Take 300 mg by mouth daily.     [provider]   Social History   Socioeconomic History  . Marital status: Married    Spouse name: Not on file  . Number of children: 2  . Years of education: Not on file  . Highest education level: Some college, no degree  Occupational History  . Not on file  Tobacco Use  . Smoking status: Never Smoker  . Smokeless tobacco: Never Used  . 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 Vaping Use: Never used  Substance and Sexual Activity  . Alcohol use: Yes    Comment: occasional/ 2 drinks a month  . Drug use: No  . Sexual activity: Yes    Birth control/protection: Post-menopausal, Surgical  Other Topics Concern  . Not on file  Social History Narrative   Lives at home with husband.  Retired.  Education some college.  2 children.  Caffeine 3-4 glasses of tea/ day.   Social Determinants of Health   Financial Resource Strain: Not on file  Food Insecurity: Not on  file  Transportation Needs: Not on file  Physical Activity: Not on file  Stress: Not on file  Social Connections: Not on file  Intimate Partner Violence: Not on file    Review of Systems   Objective:   Vitals:   01/11/21 1421  BP: 128/78  Pulse: 67  Temp: (!) 97.2 F (36.2 C)  SpO2:  97%  Height: 5\' 7"  (1.702 m)     Physical Exam Vitals reviewed.  Constitutional:      Appearance: She is well-developed.  HENT:     Head: Normocephalic and atraumatic.  Eyes:     Conjunctiva/sclera: Conjunctivae normal.     Pupils: Pupils are equal, round, and reactive to light.  Neck:     Vascular: No carotid bruit.  Cardiovascular:     Rate and Rhythm: Normal rate and regular rhythm.     Heart sounds: Normal heart sounds. No murmur heard. No gallop.   Pulmonary:     Effort: Pulmonary effort is normal.     Breath sounds: Normal breath sounds.  Abdominal:     Palpations: Abdomen is soft. There is no pulsatile mass.     Tenderness: There is no abdominal tenderness.  Musculoskeletal:     Right lower leg: Edema (trace nonpitting at ankles bilat. ) present.     Left lower leg: Edema present.     Comments: Multiple bony prominent DIP joints R greater than left hand.   Skin:    General: Skin is warm and dry.  Neurological:     Mental Status: She is alert and oriented to person, place, and time.  Psychiatric:        Behavior: Behavior normal.      39 minutes spent during visit, greater than 50% counseling and assimilation of information, chart review, and discussion of plan.    Assessment & Plan:  Zeenat Jeanbaptiste is a 71 y.o. female . Mixed hyperlipidemia - Plan: atorvastatin (LIPITOR) 20 MG tablet, Comprehensive metabolic panel, Lipid panel  -Check labs, tolerating Lipitor, continue same dose  Essential hypertension - Plan: furosemide (LASIX) 40 MG tablet History of peripheral edema - Plan: Ambulatory referral to Cardiology Family history of cardiac disorder in mother - Plan: Ambulatory referral to Cardiology  -Continue on furosemide for now, but will refer to cardiology to discuss family history of early cardiac disease and possible consideration of echo given persistent need for furosemide, no known history of CHF but possible MVP in the past.  I did not hear an  appreciable murmur on exam today.  Recommended monitoring home blood pressures if any further orthostatic symptoms as may need lower dose of furosemide.  Medication refill  History of migraine headaches - Plan: promethazine (PHENERGAN) 25 MG tablet  -Rare, well controlled with low-dose Phenergan if needed.  Continue same.  Estrogen deficiency - Plan: DG Bone Density ordered and phone number provided for her to schedule mammogram as well.   Hand arthritis  -Suspected osteoarthritis, followed by hand specialist.  Option of glucosamine, chondroitin, turmeric, symptomatic care, short course of topical NSAID but watch for any gastrointestinal symptoms although less likely.  Follow-up with hand specialist if persistent or worsening symptoms.    Meds ordered this encounter  Medications  . furosemide (LASIX) 40 MG tablet    Sig: Take 1 tablet (40 mg total) by mouth daily.    Dispense:  90 tablet    Refill:  1  . promethazine (PHENERGAN) 25 MG tablet    Sig: Take 1 tablet (25  mg total) by mouth every 6 (six) hours as needed for nausea or vomiting.    Dispense:  10 tablet    Refill:  1  . atorvastatin (LIPITOR) 20 MG tablet    Sig: Take 1 tablet (20 mg total) by mouth every morning.    Dispense:  90 tablet    Refill:  3   Patient Instructions  Keep a record of your blood pressures outside of the office - especially if lightheaded or dizzy - may need to decrease furosemide.  Return to the clinic or go to the nearest emergency room if any of your symptoms worsen or new symptoms occur.  I will refer you to cardiology to discuss furosemide and maternal history. Try to get more of your sister's medical history for that visit.  Turmeric, glucosamine options for now for arthritis. Sparingly use voltaren gel - stop if any side effects. Follow up with hand specialist.   We recommend that you schedule a mammogram for breast cancer screening. Typically, you do not need a referral to do this. Please  contact a local imaging center to schedule your mammogram. [facility] ([place]) - [phone] or [phone]   Recheck 6 months.      Signed, Merri Ray, MD [facility]

## 2021-01-11 NOTE — Patient Instructions (Addendum)
Keep a record of your blood pressures outside of the office - especially if lightheaded or dizzy - may need to decrease furosemide.  Return to the clinic or go to the nearest emergency room if any of your symptoms worsen or new symptoms occur.  I will refer you to cardiology to discuss furosemide and maternal history. Try to get more of your sister's medical history for that visit.  Turmeric, glucosamine options for now for arthritis. Sparingly use voltaren gel - stop if any side effects. Follow up with hand specialist.   We recommend that you schedule a mammogram for breast cancer screening. Typically, you do not need a referral to do this. Please contact a local imaging center to schedule your mammogram. [facility] ([place]) - [phone] or [phone]   Recheck 6 months.

## 2021-01-12 LAB — LIPID PANEL
Cholesterol: 182 mg/dL (ref 0–200)
HDL: 73.9 mg/dL (ref >39.00)
LDL Cholesterol: 81 mg/dL (ref 0–99)
NonHDL: 108.52
Total CHOL/HDL Ratio: 2
Triglycerides: 140 mg/dL (ref 0.0–149.0)
VLDL: 28 mg/dL (ref 0.0–40.0)

## 2021-01-12 LAB — COMPREHENSIVE METABOLIC PANEL
ALT: 21 U/L (ref 0–35)
AST: 26 U/L (ref 0–37)
Albumin: 5.5 g/dL — ABNORMAL HIGH (ref 3.5–5.2)
Alkaline Phosphatase: 73 U/L (ref 39–117)
BUN: 20 mg/dL (ref 6–23)
CO2: 28 mEq/L (ref 19–32)
Calcium: 10.5 mg/dL (ref 8.4–10.5)
Chloride: 101 mEq/L (ref 96–112)
Creatinine, Ser: 1.12 mg/dL (ref 0.40–1.20)
GFR: 49.78 mL/min — ABNORMAL LOW (ref >60.00)
Glucose, Bld: 70 mg/dL (ref 70–99)
Potassium: 3.6 mEq/L (ref 3.5–5.1)
Sodium: 141 mEq/L (ref 135–145)
Total Bilirubin: 0.5 mg/dL (ref 0.2–1.2)
Total Protein: 8.4 g/dL — ABNORMAL HIGH (ref 6.0–8.3)

## 2021-02-03 ENCOUNTER — Other Ambulatory Visit: Payer: Self-pay | Admitting: Family Medicine

## 2021-02-03 DIAGNOSIS — Z1231 Encounter for screening mammogram for malignant neoplasm of breast: Secondary | ICD-10-CM

## 2021-02-20 DIAGNOSIS — D225 Melanocytic nevi of trunk: Secondary | ICD-10-CM | POA: Diagnosis not present

## 2021-02-20 DIAGNOSIS — D485 Neoplasm of uncertain behavior of skin: Secondary | ICD-10-CM | POA: Diagnosis not present

## 2021-02-20 DIAGNOSIS — L814 Other melanin hyperpigmentation: Secondary | ICD-10-CM | POA: Diagnosis not present

## 2021-02-20 DIAGNOSIS — D0471 Carcinoma in situ of skin of right lower limb, including hip: Secondary | ICD-10-CM | POA: Diagnosis not present

## 2021-02-20 DIAGNOSIS — L570 Actinic keratosis: Secondary | ICD-10-CM | POA: Diagnosis not present

## 2021-02-20 DIAGNOSIS — L578 Other skin changes due to chronic exposure to nonionizing radiation: Secondary | ICD-10-CM | POA: Diagnosis not present

## 2021-02-20 DIAGNOSIS — Z86018 Personal history of other benign neoplasm: Secondary | ICD-10-CM | POA: Diagnosis not present

## 2021-02-20 DIAGNOSIS — Z85828 Personal history of other malignant neoplasm of skin: Secondary | ICD-10-CM | POA: Diagnosis not present

## 2021-02-20 DIAGNOSIS — L821 Other seborrheic keratosis: Secondary | ICD-10-CM | POA: Diagnosis not present

## 2021-03-07 DIAGNOSIS — M65331 Trigger finger, right middle finger: Secondary | ICD-10-CM | POA: Diagnosis not present

## 2021-03-07 DIAGNOSIS — M65321 Trigger finger, right index finger: Secondary | ICD-10-CM | POA: Diagnosis not present

## 2021-03-07 DIAGNOSIS — M79644 Pain in right finger(s): Secondary | ICD-10-CM | POA: Diagnosis not present

## 2021-04-07 ENCOUNTER — Ambulatory Visit: Payer: Medicare Other

## 2021-04-27 DIAGNOSIS — M65331 Trigger finger, right middle finger: Secondary | ICD-10-CM | POA: Diagnosis not present

## 2021-05-01 ENCOUNTER — Other Ambulatory Visit: Payer: Self-pay

## 2021-05-01 ENCOUNTER — Telehealth (INDEPENDENT_AMBULATORY_CARE_PROVIDER_SITE_OTHER): Payer: Medicare Other | Admitting: Family Medicine

## 2021-05-01 VITALS — BP 121/71 | Wt 156.0 lb

## 2021-05-01 DIAGNOSIS — J392 Other diseases of pharynx: Secondary | ICD-10-CM | POA: Diagnosis not present

## 2021-05-01 DIAGNOSIS — R059 Cough, unspecified: Secondary | ICD-10-CM | POA: Diagnosis not present

## 2021-05-01 DIAGNOSIS — J309 Allergic rhinitis, unspecified: Secondary | ICD-10-CM | POA: Diagnosis not present

## 2021-05-01 MED ORDER — BENZONATATE 100 MG PO CAPS: 100.0000 mg | ORAL_CAPSULE | Freq: Two times a day (BID) | ORAL | 0 refills | Status: DC | PRN

## 2021-05-01 NOTE — Patient Instructions (Addendum)
Restart flonase daily to see if that helps cough, possible postnasal drip that may be contrib If belching, can try pepcid over the counter.  I will refer you to ENT.

## 2021-05-01 NOTE — Progress Notes (Signed)
Virtual Visit via Video Note  I connected with [name] on 05/01/21 at 4:48 PM by a video enabled telemedicine application and verified that I am speaking with the correct person using two identifiers.  Patient location:home My location: office - [place].    I discussed the limitations, risks, security and privacy concerns of performing an evaluation and management service by telephone and the availability of in person appointments. I also discussed with the patient that there may be a patient responsible charge related to this service. The patient expressed understanding and agreed to proceed, consent obtained  Chief complaint:  Chief Complaint  Patient presents with   Cough    Pt reports cough since early June, reports consistent since then, pt reports some congestion, reports negative COVID test      History of Present Illness: [name] is a 70 y.o. female  Cough:  Present past few months  - week before father's day. Some chest congestion. Thought was allergies. Persistent congestion/cough into father's day - home covid test negative. Went on cruise.  Tickle in upper chest, no sore throat. Irritant cough at that time with some cough fits. Cough has improved. But still slight cough at times. Coughing fits at times. Hoarseness worse on cruise, now back to baseline.   Neck surgery in 2001 and 2019 for cervical fusion. Some voice change after 2019 surgery, sent to ENT, then referred to center [facility] voice disorders at [facility] - Dr. Rowe Clack. Some more hoarseness with current cough past few months. Was told by massage therapist that scar on neck may be contributing.  Massage of scar on neck - helped cough for a few weeks. Cough started to come back. Better after repeat massage a week ago. Now some return of slight cough. Possible slight PND.  No nasal congestion.   No heartburn, but some belching. No recent PPI/H2 blocker.   No shortness of breath. No fevers.   Claritin QD for allergies. Temporarily used flonase that helped.  I last saw her in May, transition of care at that time with history of peripheral edema, continued on furosemide and referred to cardiology.  Discussed possible echo given persistent need for furosemide, family history of early cardiac disease.  Cardiology appt tomorrow.   Patient Active Problem List   Diagnosis Date Noted   Cervical vertebral fusion 02/14/2018   OA (osteoarthritis) of knee 06/17/2017   Trochanteric bursitis of left hip 07/03/2015   Routine general medical examination at a health care facility 11/18/2014   CARCINOMA, SKIN, SQUAMOUS CELL 07/06/2010   ACTINIC KERATOSIS 07/06/2010   NEOPLASM, MALIGNANT, BASAL CELL, CARCINOMA, HX OF 07/06/2010   Hyperlipidemia 06/08/2009   DEPRESSION 06/08/2009   POSTMENOPAUSAL STATUS 06/08/2009   IRRITABLE BOWEL SYNDROME 02/17/2008   BACK PAIN, CHRONIC 02/17/2008   MIGRAINE HEADACHE 02/05/2007   Essential hypertension 02/05/2007   Past Medical History:  Diagnosis Date   Arthritis    knees, hands   Bursitis of left hip    Cancer ([place])    hx skin cancer   Cataract    Phreesia 09/11/2020   Depression    Depression    Phreesia 09/11/2020   GERD (gastroesophageal reflux disease)    INFREQUENT   Headache    HX MIGRAINES    Heart murmur     MVP 30 yrs ago - no treatment needed   Hyperlipidemia    on statin   Hypertension    "flucuations" has never been on any med   Insomnia  Irritable bowel syndrome    s/p subtotal colectomy '86>diarrhea prone   NEOPLASM, MALIGNANT, BASAL CELL, CARCINOMA, HX OF    Seasonal allergies    Past Surgical History:  Procedure Laterality Date   ABDOMINOPLASTY     ANTERIOR CERVICAL DECOMP/DISCECTOMY FUSION N/A 02/14/2018   Procedure: CERVICAL THREE-FOUR,CERVICAL FOUR-FIVE ACDF, REMOVAL CERVICAL FIVE-SEVEN  PLATE.;  Surgeon: Eustace Moore, MD;  Location: [facility];  Service: Neurosurgery;  Laterality: N/A;  anterior   BACK SURGERY   1999   lower back   BREAST ENHANCEMENT SURGERY  1983   BREAST IMPLANT REMOVAL  08/2010   BREAST SURGERY     Augmentation in 1983, Reduction 2011   BUNIONECTOMY     right  and left foot   CERVICAL SPINE SURGERY  12/18/1999   C5/6 and C6/7   CHOLECYSTECTOMY N/A    Phreesia 09/11/2020   COLECTOMY  1986   COLON SURGERY N/A    Phreesia 09/11/2020   COSMETIC SURGERY     EXCISION/RELEASE BURSA HIP Left 07/04/2015   Procedure: LEFT HIP BURSECTOMY WITH ;  Surgeon: Gaynelle Arabian, MD;  Location: [facility];  Service: Orthopedics;  Laterality: Left;   EYE SURGERY     Lasik 2010   eyelid surgery     FRACTURE SURGERY     HYSTEROSCOPY WITH D & C  08/17/2011   Procedure: DILATATION AND CURETTAGE (D&C) /HYSTEROSCOPY;  Surgeon: Margarette Asal;  Location: [facility];  Service: Gynecology;  Laterality: N/A;   INJECTION KNEE Right 07/04/2015   Procedure: CORTISONE INJECTION RIGHT KNEE ;  Surgeon: Gaynelle Arabian, MD;  Location: [facility];  Service: Orthopedics;  Laterality: Right;   JOINT REPLACEMENT N/A    Phreesia 09/11/2020   KNEE ARTHROSCOPY     right x 2   KNEE RECONSTRUCTION, MEDIAL PATELLAR FEMORAL LIGAMENT     right   LASIK  2009    bilateral   left foot surgery     removed foreign object   OPEN SURGICAL REPAIR OF GLUTEAL TENDON Left 07/04/2015   Procedure: GLUTEAL TENDON REPAIR ;  Surgeon: Gaynelle Arabian, MD;  Location: [facility];  Service: Orthopedics;  Laterality: Left;   REDUCTION MAMMAPLASTY Bilateral    SPINE SURGERY N/A    Phreesia 09/11/2020   thumb surgery     TOTAL KNEE ARTHROPLASTY Right 06/17/2017   Procedure: RIGHT TOTAL KNEE ARTHROPLASTY;  Surgeon: Gaynelle Arabian, MD;  Location: [facility];  Service: Orthopedics;  Laterality: Right;   TUBAL LIGATION     Allergies  Allergen Reactions   Nsaids Other (See Comments)    Hurts stomach   Other    Prior to Admission medications   Medication Sig Start Date End Date Taking? Authorizing Provider  acetaminophen (TYLENOL) 500 MG tablet Take 1,000  mg by mouth every 8 (eight) hours as needed for moderate pain or headache.    Yes [provider]  atorvastatin (LIPITOR) 20 MG tablet Take 1 tablet (20 mg total) by mouth every morning. 01/11/21  Yes Wendie Agreste, MD  buPROPion (WELLBUTRIN XL) 300 MG 24 hr tablet TAKE 1 TABLET DAILY 12/05/20  Yes Maximiano Coss, NP  Calcium-Phosphorus-Vitamin D (CITRACAL +D3 PO) Take 1,000 Units by mouth daily.    Yes [provider]  cholecalciferol (VITAMIN D) 1000 units tablet Take 1,000 Units by mouth daily.   Yes [provider]  Cyanocobalamin (VITAMIN B12) 1000 MCG TBCR Take 1,000 mcg by mouth daily.   Yes [provider]  diclofenac sodium (VOLTAREN) 1 %  GEL Apply 1 g topically daily as needed.    Yes [provider]  fluorouracil (EFUDEX) 5 % cream Apply 1 application topically See admin instructions. In January applies daily for 3 weeks then off for 1-3 weeks then resume for 3 weeks. "For skin cancers" 05/31/15  Yes [provider]  furosemide (LASIX) 40 MG tablet Take 1 tablet (40 mg total) by mouth daily. 01/11/21  Yes Wendie Agreste, MD  HYDROcodone-acetaminophen (NORCO/VICODIN) 5-325 MG tablet Take 1 tablet by mouth every 6 (six) hours as needed. 11/08/20  Yes [provider]  Liniments (SALONPAS PAIN RELIEF PATCH EX) Apply 2 patches topically daily.   Yes [provider]  loperamide (IMODIUM A-D) 2 MG tablet Take 1 tablet (2 mg total) by mouth 3 (three) times daily as needed for diarrhea or loose stools. 04/13/19  Yes Stallings, Zoe A, MD  loratadine (CLARITIN) 10 MG tablet Take 10 mg by mouth daily.   Yes [provider]  Magnesium 300 MG CAPS Take 300 mg by mouth daily.    Yes [provider]  MAGNESIUM GLYCINATE PO Take 450 mg by mouth.   Yes [provider]  niacinamide 500 MG tablet Take 500 mg by mouth 2 (two) times daily with a meal.   Yes [provider]  promethazine (PHENERGAN) 25 MG  tablet Take 1 tablet (25 mg total) by mouth every 6 (six) hours as needed for nausea or vomiting. 01/11/21  Yes Wendie Agreste, MD  Simethicone (PHAZYME PO) Take 1 tablet by mouth every morning.    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  Retired.  Education some college.  2 children.  Caffeine 3-4 glasses of tea/ day.   Social Determinants of Health   Financial Resource Strain: Not on file  Food Insecurity: Not on file  Transportation Needs: Not on file  Physical Activity: Not on file  Stress: Not on file  Social Connections: Not on file  Intimate Partner Violence: Not on file    Observations/Objective: Vitals:   05/01/21 1433  BP: 121/71  Weight: 156 lb (70.8 kg)  Speaking full sentences, no respiratory distress, nontoxic appearance on video.   Assessment and Plan: Cough  Allergic rhinitis, unspecified seasonality, unspecified trigger  Throat irritation  -Cough has improved since initial symptoms in June, and based on symptoms likely viral syndrome at that time, with negative home COVID testing.  May have had some irritant cough with postnasal drip, or silent GERD with occasional belching.  Previous hoarseness and prior cervical spine surgery,  evaluated by ENT prior.  -Trial of Flonase nasal spray has  some improvement initially, possible postnasal drip component.  Option of Pepcid over-the-counter for belching as silent GERD component.  Tessalon Perles if needed although cough has improved and no red flags on symptoms over video.  Refer to ENT for throat irritation, has cardiology follow-up tomorrow, can have exam of lungs at that time.  Consider chest x-ray, in person evaluation if persistent cough or worsening.  Follow Up Instructions:    I discussed the assessment and treatment plan with the patient. The patient was provided an opportunity to ask questions and all were answered. The patient agreed with the plan and demonstrated an understanding of the instructions.   The patient was advised to call back or seek an in-person evaluation if the symptoms worsen or if the condition fails to improve as anticipated.  I provided 24 minutes of non-face-to-face time during this encounter.   Wendie Agreste, MD

## 2021-05-02 ENCOUNTER — Ambulatory Visit (INDEPENDENT_AMBULATORY_CARE_PROVIDER_SITE_OTHER): Payer: Medicare Other | Admitting: Cardiovascular Disease

## 2021-05-02 ENCOUNTER — Other Ambulatory Visit: Payer: Self-pay

## 2021-05-02 ENCOUNTER — Encounter: Payer: Self-pay | Admitting: Cardiovascular Disease

## 2021-05-02 VITALS — BP 132/80 | HR 77 | Ht 67.0 in | Wt 167.4 lb

## 2021-05-02 DIAGNOSIS — I110 Hypertensive heart disease with heart failure: Secondary | ICD-10-CM

## 2021-05-02 DIAGNOSIS — I5043 Acute on chronic combined systolic (congestive) and diastolic (congestive) heart failure: Secondary | ICD-10-CM | POA: Diagnosis not present

## 2021-05-02 DIAGNOSIS — I10 Essential (primary) hypertension: Secondary | ICD-10-CM | POA: Diagnosis not present

## 2021-05-02 DIAGNOSIS — I5030 Unspecified diastolic (congestive) heart failure: Secondary | ICD-10-CM | POA: Diagnosis not present

## 2021-05-02 MED ORDER — POTASSIUM CHLORIDE ER 10 MEQ PO TBCR: 10.0000 meq | EXTENDED_RELEASE_TABLET | Freq: Every day | ORAL | 11 refills | Status: DC

## 2021-05-02 NOTE — Patient Instructions (Signed)
Medication Instructions:  Your physician has recommended you make the following change in your medication:   START K-dur (potassium chloride) 10 mEq once daily   *If you need a refill on your cardiac medications before your next appointment, please call your pharmacy*   Lab Work: Your physician recommends that you return for lab work in: BMET in 3 weeks  If you have labs (blood work) drawn today and your tests are completely normal, you will receive your results only by: [employer] (if you have MyChart) OR A paper copy in the mail If you have any lab test that is abnormal or we need to change your treatment, we will call you to review the results.   Testing/Procedures: Your physician has requested that you have an echocardiogram. Echocardiography is a painless test that uses sound waves to create images of your heart. It provides your doctor with information about the size and shape of your heart and how well your heart's chambers and valves are working. This procedure takes approximately one hour. There are no restrictions for this procedure.    Follow-Up: At [facility], you and your health needs are our priority.  As part of our continuing mission to provide you with exceptional heart care, we have created designated Provider Care Teams.  These Care Teams include your primary Cardiologist (physician) and Advanced Practice Providers (APPs -  Physician Assistants and Nurse Practitioners) who all work together to provide you with the care you need, when you need it.   Your next appointment:   6 month(s)  The format for your next appointment:   In Person  Provider:   You may see Mertie Moores, MD or one of the following Advanced Practice Providers on your designated Care Team:   Richardson Dopp, PA-C [place], [place]

## 2021-05-02 NOTE — Progress Notes (Signed)
Cardiology Office Note:    Date:  05/02/2021   ID:  [name], DOB 06-15-1951, MRN [id number]  PCP:  Wendie Agreste, MD   [facility] Providers Cardiologist:  Kathrynn Humble to update primary MD,subspecialty MD or APP then REFRESH:1}    Referring MD: Wendie Agreste, MD   Chief Complaint  Patient presents with   Congestive Heart Failure     Aug. 23, 2022   [name] is a 70 y.o. female with a hx of hyperlipidemia, MVP and leg edema.  We are asked to see her today by Dr. Nyoka Cowden for further evaluation of her leg edema.  She started taking furosemid since 1987 after knee surgery .  She gains weight if she runs out of the lasix  Has not seen a cardiologist , no previous testing   Eats a fairly unrestricted diet . Usually craves sweets instead of salty   Takes her BP at home  Average is 126 / 62   Plays golf 3 times a week  No cardio exercise   Non smoker ,  No  etoh  Mother diet of MI  Father died of cancer  Brother and sisters with HTN    Past Medical History:  Diagnosis Date   Arthritis    knees, hands   Bursitis of left hip    Cancer ([place])    hx skin cancer   Cataract    Phreesia 09/11/2020   Depression    Depression    Phreesia 09/11/2020   GERD (gastroesophageal reflux disease)    INFREQUENT   Headache    HX MIGRAINES    Heart murmur     MVP 30 yrs ago - no treatment needed   Hyperlipidemia    on statin   Hypertension    "flucuations" has never been on any med   Insomnia    Irritable bowel syndrome    s/p subtotal colectomy '86>diarrhea prone   NEOPLASM, MALIGNANT, BASAL CELL, CARCINOMA, HX OF    Seasonal allergies     Past Surgical History:  Procedure Laterality Date   ABDOMINOPLASTY     ANTERIOR CERVICAL DECOMP/DISCECTOMY FUSION N/A 02/14/2018   Procedure: CERVICAL THREE-FOUR,CERVICAL FOUR-FIVE ACDF, REMOVAL CERVICAL FIVE-SEVEN  PLATE.;  Surgeon: Eustace Moore, MD;  Location: [facility];  Service: Neurosurgery;   Laterality: N/A;  anterior   BACK SURGERY  1999   lower back   BREAST ENHANCEMENT SURGERY  1983   BREAST IMPLANT REMOVAL  08/2010   BREAST SURGERY     Augmentation in 1983, Reduction 2011   BUNIONECTOMY     right  and left foot   CERVICAL SPINE SURGERY  12/18/1999   C5/6 and C6/7   CHOLECYSTECTOMY N/A    Phreesia 09/11/2020   COLECTOMY  1986   COLON SURGERY N/A    Phreesia 09/11/2020   COSMETIC SURGERY     EXCISION/RELEASE BURSA HIP Left 07/04/2015   Procedure: LEFT HIP BURSECTOMY WITH ;  Surgeon: Gaynelle Arabian, MD;  Location: [facility];  Service: Orthopedics;  Laterality: Left;   EYE SURGERY     Lasik 2010   eyelid surgery     FRACTURE SURGERY     HYSTEROSCOPY WITH D & C  08/17/2011   Procedure: DILATATION AND CURETTAGE (D&C) /HYSTEROSCOPY;  Surgeon: Margarette Asal;  Location: [facility];  Service: Gynecology;  Laterality: N/A;   INJECTION KNEE Right 07/04/2015   Procedure: CORTISONE INJECTION RIGHT KNEE ;  Surgeon: Gaynelle Arabian, MD;  Location:  [facility];  Service: Orthopedics;  Laterality: Right;   JOINT REPLACEMENT N/A    Phreesia 09/11/2020   KNEE ARTHROSCOPY     right x 2   KNEE RECONSTRUCTION, MEDIAL PATELLAR FEMORAL LIGAMENT     right   LASIK  2009    bilateral   left foot surgery     removed foreign object   OPEN SURGICAL REPAIR OF GLUTEAL TENDON Left 07/04/2015   Procedure: GLUTEAL TENDON REPAIR ;  Surgeon: Gaynelle Arabian, MD;  Location: [facility];  Service: Orthopedics;  Laterality: Left;   REDUCTION MAMMAPLASTY Bilateral    SPINE SURGERY N/A    Phreesia 09/11/2020   thumb surgery     TOTAL KNEE ARTHROPLASTY Right 06/17/2017   Procedure: RIGHT TOTAL KNEE ARTHROPLASTY;  Surgeon: Gaynelle Arabian, MD;  Location: [facility];  Service: Orthopedics;  Laterality: Right;   TUBAL LIGATION      Current Medications: Current Meds  Medication Sig   acetaminophen (TYLENOL) 500 MG tablet Take 1,000 mg by mouth every 8 (eight) hours as needed for moderate pain or headache.    atorvastatin  (LIPITOR) 20 MG tablet Take 1 tablet (20 mg total) by mouth every morning.   benzonatate (TESSALON) 100 MG capsule Take 1 capsule (100 mg total) by mouth 2 (two) times daily as needed for cough.   buPROPion (WELLBUTRIN XL) 300 MG 24 hr tablet TAKE 1 TABLET DAILY   Calcium-Phosphorus-Vitamin D (CITRACAL +D3 PO) Take 1,000 Units by mouth daily.    cholecalciferol (VITAMIN D) 1000 units tablet Take 1,000 Units by mouth daily.   Cyanocobalamin (VITAMIN B12) 1000 MCG TBCR Take 1,000 mcg by mouth daily.   diclofenac sodium (VOLTAREN) 1 % GEL Apply 1 g topically daily as needed.    fluorouracil (EFUDEX) 5 % cream Apply 1 application topically See admin instructions. In January applies daily for 3 weeks then off for 1-3 weeks then resume for 3 weeks. "For skin cancers"   furosemide (LASIX) 40 MG tablet Take 1 tablet (40 mg total) by mouth daily.   HYDROcodone-acetaminophen (NORCO/VICODIN) 5-325 MG tablet Take 1 tablet by mouth every 6 (six) hours as needed.   Liniments (SALONPAS PAIN RELIEF PATCH EX) Apply 2 patches topically daily.   loperamide (IMODIUM A-D) 2 MG tablet Take 1 tablet (2 mg total) by mouth 3 (three) times daily as needed for diarrhea or loose stools.   loratadine (CLARITIN) 10 MG tablet Take 10 mg by mouth daily.   Magnesium 300 MG CAPS Take 300 mg by mouth daily.    MAGNESIUM GLYCINATE PO Take 450 mg by mouth.   niacinamide 500 MG tablet Take 500 mg by mouth 2 (two) times daily with a meal.   potassium chloride (KLOR-CON) 10 MEQ tablet Take 1 tablet (10 mEq total) by mouth daily.   promethazine (PHENERGAN) 25 MG tablet Take 1 tablet (25 mg total) by mouth every 6 (six) hours as needed for nausea or vomiting.   Simethicone (PHAZYME PO) Take 1 tablet by mouth every morning.      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  Retired.  Education some college.  2 children.  Caffeine 3-4 glasses of tea/ day.   Social Determinants of Health   Financial Resource Strain: Not on file  Food Insecurity: Not on file  Transportation Needs: Not on file  Physical Activity: Not on file  Stress: Not on file  Social Connections: Not on file     Family History: The patient's family history includes Breast cancer in her sister; Cancer in her daughter and father; Diabetes in her brother; Heart disease in her brother, brother, and mother; High Cholesterol in her sister; Hypertension in her brother, brother, daughter, mother, and sister; Neuropathy in her sister; Psoriasis in her sister; Stomach cancer (age of onset: 3) in her father; Thyroid disease in her sister. There is no history of Colon cancer.  ROS:   Please see the history of present illness.     All other systems reviewed and are negative.  EKGs/Labs/Other Studies Reviewed:    The following studies were reviewed today:   EKG: August 2030, 2022: Normal sinus rhythm at 77.  No ST or T wave changes.  Recent Labs: 01/11/2021: ALT 21; BUN 20; Creatinine, Ser 1.12; Potassium 3.6; Sodium 141  Recent Lipid Panel    Component Value Date/Time   CHOL 182 01/11/2021 1546   CHOL 168 05/27/2020 1133   TRIG 140.0 01/11/2021 1546   HDL 73.90 01/11/2021 1546   HDL 58 05/27/2020 1133   CHOLHDL 2 01/11/2021  1546   VLDL 28.0 01/11/2021 1546   LDLCALC 81 01/11/2021 1546   LDLCALC 89 05/27/2020 1133     Risk Assessment/Calculations:           Physical Exam:    VS:  BP 132/80   Pulse 77   Ht '5\' 7"'$  (1.702 m)   Wt 167 lb 6.4 oz (75.9 kg)   SpO2 96%   BMI 26.22 kg/m     Wt Readings from Last 3 Encounters:  05/02/21 167 lb 6.4 oz (75.9 kg)  05/01/21 156 lb (70.8 kg)  05/27/20 163 lb 9.6 oz (74.2 kg)     GEN:  Well nourished, well developed in no acute distress HEENT: Normal NECK: No JVD; No carotid bruits LYMPHATICS: No lymphadenopathy CARDIAC: RRR, no murmurs, rubs, gallops RESPIRATORY:  Clear to auscultation without rales, wheezing or rhonchi  ABDOMEN: Soft, non-tender, non-distended MUSCULOSKELETAL:  No edema; No deformity  SKIN: Warm and dry NEUROLOGIC:  Alert and oriented x 3 PSYCHIATRIC:  Normal affect   ASSESSMENT:    1. Hypertensive heart disease with diastolic congestive heart failure, unspecified HF chronicity ([place])   2. Essential hypertension    PLAN:    In order of problems listed above:  Possible chronic diastolic dysfunction: Karlisha presents after being on Lasix for many years.  She originally started the medication after having knee surgery and subsequent leg swelling.  When she has run out of the medication she is noticed that she gains weight fairly consistently until she takes the Lasix again.  She is gained as much  is 8 pounds over the course of 4 days.  I like to get an echocardiogram to see if she has diastolic dysfunction.  Her cardiac exam is fairly normal so I doubt that she is got systolic dysfunction.  I would like to add potassium chloride 10 mill equivalents a day to her medical regimen.  Assuming that she does not have systolic dysfunction, we may try to taper her Lasix down slightly.  I have instructed her to take the potassium anytime she takes Lasix so that as we decrease Lasix she will also decrease her potassium.  I will see her in 6 months  for a follow-up visit.        Medication Adjustments/Labs and Tests Ordered: Current medicines are reviewed at length with the patient today.  Concerns regarding medicines are outlined above.  Orders Placed This Encounter  Procedures   Basic Metabolic Panel (BMET)   EKG 12-Lead   ECHOCARDIOGRAM COMPLETE   Meds ordered this encounter  Medications   potassium chloride (KLOR-CON) 10 MEQ tablet    Sig: Take 1 tablet (10 mEq total) by mouth daily.    Dispense:  30 tablet    Refill:  11    Patient Instructions  Medication Instructions:  Your physician has recommended you make the following change in your medication:   START K-dur (potassium chloride) 10 mEq once daily   *If you need a refill on your cardiac medications before your next appointment, please call your pharmacy*   Lab Work: Your physician recommends that you return for lab work in: BMET in 3 weeks  If you have labs (blood work) drawn today and your tests are completely normal, you will receive your results only by: [employer] (if you have MyChart) OR A paper copy in the mail If you have any lab test that is abnormal or we need to change your treatment, we will call you to review the results.   Testing/Procedures: Your physician has requested that you have an echocardiogram. Echocardiography is a painless test that uses sound waves to create images of your heart. It provides your doctor with information about the size and shape of your heart and how well your heart's chambers and valves are working. This procedure takes approximately one hour. There are no restrictions for this procedure.    Follow-Up: At [facility], you and your health needs are our priority.  As part of our continuing mission to provide you with exceptional heart care, we have created designated Provider Care Teams.  These Care Teams include your primary Cardiologist (physician) and Advanced Practice Providers (APPs -  Physician  Assistants and Nurse Practitioners) who all work together to provide you with the care you need, when you need it.   Your next appointment:   6 month(s)  The format for your next appointment:   In Person  Provider:   You may see Mertie Moores, MD or one of the following Advanced Practice Providers on your designated Care Team:   Richardson Dopp, PA-C Robbie Lis, [place]    Signed, Mertie Moores, MD  05/02/2021 2:55 PM    [place]

## 2021-05-29 ENCOUNTER — Other Ambulatory Visit: Payer: Self-pay

## 2021-05-29 ENCOUNTER — Other Ambulatory Visit: Payer: Medicare Other | Admitting: *Deleted

## 2021-05-29 ENCOUNTER — Ambulatory Visit
Admission: RE | Admit: 2021-05-29 | Discharge: 2021-05-29 | Disposition: A | Discharge status: Home / Self Care | Payer: Medicare Other | Source: Ambulatory Visit | Attending: Family Medicine | Admitting: Family Medicine

## 2021-05-29 ENCOUNTER — Ambulatory Visit (HOSPITAL_COMMUNITY): Payer: Medicare Other | Attending: Cardiology

## 2021-05-29 DIAGNOSIS — Z1231 Encounter for screening mammogram for malignant neoplasm of breast: Secondary | ICD-10-CM | POA: Diagnosis not present

## 2021-05-29 DIAGNOSIS — I110 Hypertensive heart disease with heart failure: Secondary | ICD-10-CM | POA: Insufficient documentation

## 2021-05-29 DIAGNOSIS — I5030 Unspecified diastolic (congestive) heart failure: Secondary | ICD-10-CM | POA: Insufficient documentation

## 2021-05-29 DIAGNOSIS — I10 Essential (primary) hypertension: Secondary | ICD-10-CM | POA: Insufficient documentation

## 2021-05-29 LAB — ECHOCARDIOGRAM COMPLETE
Area-P 1/2: 3.17 cm2
S' Lateral: 2.6 cm

## 2021-05-30 LAB — BASIC METABOLIC PANEL
BUN/Creatinine Ratio: 17 (ref 12–28)
BUN: 17 mg/dL (ref 8–27)
CO2: 25 mmol/L (ref 20–29)
Calcium: 10.1 mg/dL (ref 8.7–10.3)
Chloride: 99 mmol/L (ref 96–106)
Creatinine, Ser: 0.98 mg/dL (ref 0.57–1.00)
Glucose: 89 mg/dL (ref 65–99)
Potassium: 4.1 mmol/L (ref 3.5–5.2)
Sodium: 141 mmol/L (ref 134–144)
eGFR: 62 mL/min/{1.73_m2} (ref >59)

## 2021-06-04 DIAGNOSIS — Z23 Encounter for immunization: Secondary | ICD-10-CM | POA: Diagnosis not present

## 2021-07-07 DIAGNOSIS — M65331 Trigger finger, right middle finger: Secondary | ICD-10-CM | POA: Diagnosis not present

## 2021-07-07 DIAGNOSIS — M65321 Trigger finger, right index finger: Secondary | ICD-10-CM | POA: Diagnosis not present

## 2021-07-10 DIAGNOSIS — K219 Gastro-esophageal reflux disease without esophagitis: Secondary | ICD-10-CM | POA: Diagnosis not present

## 2021-07-10 DIAGNOSIS — R053 Chronic cough: Secondary | ICD-10-CM | POA: Diagnosis not present

## 2021-07-10 DIAGNOSIS — R0989 Other specified symptoms and signs involving the circulatory and respiratory systems: Secondary | ICD-10-CM | POA: Diagnosis not present

## 2021-07-10 DIAGNOSIS — R09A2 Foreign body sensation, throat: Secondary | ICD-10-CM | POA: Insufficient documentation

## 2021-07-11 DIAGNOSIS — Z23 Encounter for immunization: Secondary | ICD-10-CM | POA: Diagnosis not present

## 2021-07-14 ENCOUNTER — Ambulatory Visit (INDEPENDENT_AMBULATORY_CARE_PROVIDER_SITE_OTHER): Payer: Medicare Other | Admitting: Family Medicine

## 2021-07-14 ENCOUNTER — Other Ambulatory Visit: Payer: Self-pay

## 2021-07-14 ENCOUNTER — Encounter: Payer: Self-pay | Admitting: Family Medicine

## 2021-07-14 VITALS — BP 128/72 | HR 71 | Temp 98.1°F | Resp 16 | Ht 67.0 in | Wt 169.0 lb

## 2021-07-14 DIAGNOSIS — Z0000 Encounter for general adult medical examination without abnormal findings: Secondary | ICD-10-CM

## 2021-07-14 DIAGNOSIS — E782 Mixed hyperlipidemia: Secondary | ICD-10-CM

## 2021-07-14 DIAGNOSIS — I10 Essential (primary) hypertension: Secondary | ICD-10-CM | POA: Diagnosis not present

## 2021-07-14 DIAGNOSIS — Z131 Encounter for screening for diabetes mellitus: Secondary | ICD-10-CM

## 2021-07-14 DIAGNOSIS — Z760 Encounter for issue of repeat prescription: Secondary | ICD-10-CM

## 2021-07-14 DIAGNOSIS — Z130 Encounter for screening for diseases of the blood and blood-forming organs and certain disorders involving the immune mechanism: Secondary | ICD-10-CM

## 2021-07-14 LAB — COMPREHENSIVE METABOLIC PANEL
ALT: 17 U/L (ref 0–35)
AST: 22 U/L (ref 0–37)
Albumin: 4.9 g/dL (ref 3.5–5.2)
Alkaline Phosphatase: 68 U/L (ref 39–117)
BUN: 20 mg/dL (ref 6–23)
CO2: 32 mEq/L (ref 19–32)
Calcium: 10.3 mg/dL (ref 8.4–10.5)
Chloride: 102 mEq/L (ref 96–112)
Creatinine, Ser: 0.94 mg/dL (ref 0.40–1.20)
GFR: 61.22 mL/min (ref >60.00)
Glucose, Bld: 103 mg/dL — ABNORMAL HIGH (ref 70–99)
Potassium: 4.1 mEq/L (ref 3.5–5.1)
Sodium: 142 mEq/L (ref 135–145)
Total Bilirubin: 0.6 mg/dL (ref 0.2–1.2)
Total Protein: 7.4 g/dL (ref 6.0–8.3)

## 2021-07-14 LAB — LIPID PANEL
Cholesterol: 165 mg/dL (ref 0–200)
HDL: 64.5 mg/dL (ref >39.00)
LDL Cholesterol: 82 mg/dL (ref 0–99)
NonHDL: 100.45
Total CHOL/HDL Ratio: 3
Triglycerides: 92 mg/dL (ref 0.0–149.0)
VLDL: 18.4 mg/dL (ref 0.0–40.0)

## 2021-07-14 MED ORDER — BUPROPION HCL ER (XL) 300 MG PO TB24: 300.0000 mg | ORAL_TABLET | Freq: Every day | ORAL | 3 refills | Status: DC

## 2021-07-14 MED ORDER — FUROSEMIDE 40 MG PO TABS: 40.0000 mg | ORAL_TABLET | Freq: Every day | ORAL | 1 refills | Status: DC

## 2021-07-14 NOTE — Patient Instructions (Addendum)
Continue same meds for now. If new symptoms, please follow up to discuss further.  Goal of 150 minutes per week, spread out most days per week.   Health Maintenance After Age 71 After age 49, you are at a higher risk for certain long-term diseases and infections as well as injuries from falls. Falls are a major cause of broken bones and head injuries in people who are older than age 78. Getting regular preventive care can help to keep you healthy and well. Preventive care includes getting regular testing and making lifestyle changes as recommended by your health care provider. Talk with your health care provider about: Which screenings and tests you should have. A screening is a test that checks for a disease when you have no symptoms. A diet and exercise plan that is right for you. What should I know about screenings and tests to prevent falls? Screening and testing are the best ways to find a health problem early. Early diagnosis and treatment give you the best chance of managing medical conditions that are common after age 71. Certain conditions and lifestyle choices may make you more likely to have a fall. Your health care provider may recommend: Regular vision checks. Poor vision and conditions such as cataracts can make you more likely to have a fall. If you wear glasses, make sure to get your prescription updated if your vision changes. Medicine review. Work with your health care provider to regularly review all of the medicines you are taking, including over-the-counter medicines. Ask your health care provider about any side effects that may make you more likely to have a fall. Tell your health care provider if any medicines that you take make you feel dizzy or sleepy. Strength and balance checks. Your health care provider may recommend certain tests to check your strength and balance while standing, walking, or changing positions. Foot health exam. Foot pain and numbness, as well as not wearing  proper footwear, can make you more likely to have a fall. Screenings, including: Osteoporosis screening. Osteoporosis is a condition that causes the bones to get weaker and break more easily. Blood pressure screening. Blood pressure changes and medicines to control blood pressure can make you feel dizzy. Depression screening. You may be more likely to have a fall if you have a fear of falling, feel depressed, or feel unable to do activities that you used to do. Alcohol use screening. Using too much alcohol can affect your balance and may make you more likely to have a fall. Follow these instructions at home: Lifestyle Do not drink alcohol if: Your health care provider tells you not to drink. If you drink alcohol: Limit how much you have to: 0-1 drink a day for women. 0-2 drinks a day for men. Know how much alcohol is in your drink. In the [place] one drink equals one 12 oz bottle of beer (355 mL), one 5 oz glass of wine (148 mL), or one 1 oz glass of hard liquor (44 mL). Do not use any products that contain nicotine or tobacco. These products include cigarettes, chewing tobacco, and vaping devices, such as e-cigarettes. If you need help quitting, ask your health care provider. Activity  Follow a regular exercise program to stay fit. This will help you maintain your balance. Ask your health care provider what types of exercise are appropriate for you. If you need a cane or walker, use it as recommended by your health care provider. Wear supportive shoes that have nonskid soles. Safety  Remove any tripping hazards, such as rugs, cords, and clutter. Install safety equipment such as grab bars in bathrooms and safety rails on stairs. Keep rooms and walkways well-lit. General instructions Talk with your health care provider about your risks for falling. Tell your health care provider if: You fall. Be sure to tell your health care provider about all falls, even ones that seem minor. You feel  dizzy, tiredness (fatigue), or off-balance. Take over-the-counter and prescription medicines only as told by your health care provider. These include supplements. Eat a healthy diet and maintain a healthy weight. A healthy diet includes low-fat dairy products, low-fat (lean) meats, and fiber from whole grains, beans, and lots of fruits and vegetables. Stay current with your vaccines. Schedule regular health, dental, and eye exams. Summary Having a healthy lifestyle and getting preventive care can help to protect your health and wellness after age 47. Screening and testing are the best way to find a health problem early and help you avoid having a fall. Early diagnosis and treatment give you the best chance for managing medical conditions that are more common for people who are older than age 74. Falls are a major cause of broken bones and head injuries in people who are older than age 7. Take precautions to prevent a fall at home. Work with your health care provider to learn what changes you can make to improve your health and wellness and to prevent falls. This information is not intended to replace advice given to you by your health care provider. Make sure you discuss any questions you have with your health care provider. Document Revised: 01/16/2021 Document Reviewed: 01/16/2021 Elsevier Patient Education  [place].

## 2021-07-14 NOTE — Progress Notes (Signed)
Subjective:  Patient ID: [name], female    DOB: Feb 17, 1951  Age: 70 y.o. MRN: [id number]  CC:  Chief Complaint  Patient presents with   Medicare Wellness    Pt here for wellness exam, unsure of medications status     HPI [name] presents for   Presents for annual wellness exam.   Care team: PCP: me ENT: [facility]burn Cornelia Cardiology:[facility] Derm: [place], history actinic keratosis. OrthoEartha Inch for hand. PMR: [facility] Optho: [place] Gyn: [facility], Dr. Ronnald Ramp, 2 cervical fusions.  Recent ENT eval for globus sensation, GERD after video visit August.  Discussed possible GERD or postnasal drip at that time, recommended Pepcid, Flonase.  Tessalon Perles.  ENT also suspicious for possible atypical reflux.  Start omeprazole 40 mg daily, reflux precautions.  Continue fluticasone nasal spray, plan for follow-up with Dr. Rowe Clack if not improving. Using 2 sprays/nostril, and PPI. Just started plan.   History of IBS, partial colectomy. Episodic diarrhea, treats with Imodium every morning, overall regular bowel movements, some diarrhea.  rare hydrocodone when discussed in May.last used 3-4 weeks ago for back issues.   Hyperlipidemia: Lipitor 20 mg daily. No new myalgias/side effects.  Lab Results  Component Value Date   CHOL 182 01/11/2021   HDL 73.90 01/11/2021   LDLCALC 81 01/11/2021   TRIG 140.0 01/11/2021   CHOLHDL 2 01/11/2021   Lab Results  Component Value Date   ALT 21 01/11/2021   AST 26 01/11/2021   ALKPHOS 73 01/11/2021   BILITOT 0.5 01/11/2021    Hypertension: Followed by cardiology with hypertensive heart disease, chronic diastolic CHF.  Appointment August 23.  Continued Lasix, added potassium with use of Lasix and possible tapering of Lasix as tolerated.  60-month follow-up.  Echo September 19.  No evidence of MVP, bicuspid AV, normal LV systolic function and grade 1 diastolic dysfunction. Taking furosemide daily. Potassium  daily.  Home readings: 120/70 range.  BP Readings from Last 3 Encounters:  07/14/21 128/72  05/02/21 132/80  05/01/21 121/71   Lab Results  Component Value Date   CREATININE 0.98 05/29/2021   Migraine headache Occasional headache treated with Phenergan, no changes. 1-2/month.1/2 pill.  Fall screening Fall Risk  07/14/2021 05/01/2021 01/11/2021 05/27/2020 10/19/2019  Falls in the past year? 0 0 0 0 0  Number falls in past yr: 0 0 - 0 0  Injury with Fall? 0 0 - 0 0  Risk for fall due to : No Fall Risks No Fall Risks - - -  Follow up Falls evaluation completed Falls evaluation completed - Falls evaluation completed Falls evaluation completed   Lighting in home:adequate Loose rugs/carpets/pets: has pets at home.  Stairs: 1 flight - handrail. MBR downstairs.  Grab bars in bathroom: none.  Timed up and go:8 seconds, normal gait, no instability.   Depression: Wellbutrin XL 300 mg daily. Working well.  Depression screen [facility] 2/9 07/14/2021 05/01/2021 01/11/2021 05/27/2020 10/19/2019  Decreased Interest 0 0 0 0 0  Down, Depressed, Hopeless 0 0 0 0 0  PHQ - 2 Score 0 0 0 0 0  Altered sleeping 3 - - - -  Tired, decreased energy 0 - - - -  Change in appetite 0 - - - -  Feeling bad or failure about yourself  0 - - - -  Trouble concentrating 3 - - - -  Moving slowly or fidgety/restless 0 - - - -  Suicidal thoughts 0 - - - -  PHQ-9 Score 6 - - - -  Difficult doing work/chores - - - - -    Cancer Screening: Colonoscopy 2013 - no need to repeat.  Mammogram - 9/23 BMD on 11/28.   Immunization History  Administered Date(s) Administered   Influenza Split 05/29/2012   Influenza Whole 06/07/2009, 07/06/2010   Influenza, High Dose Seasonal PF 10/09/2017   Influenza,inj,Quad PF,6+ Mos 08/03/2013, 06/18/2014, 06/09/2015   Influenza,inj,quad, With Preservative 08/21/2019   Influenza-Unspecified 06/10/2016, 08/21/2019   PFIZER(Purple Top)SARS-COV-2 Vaccination 10/16/2019, 11/10/2019, 06/18/2020,  12/07/2020   Pneumococcal Conjugate-13 09/14/2015   Pneumococcal Polysaccharide-23 09/19/2016   Tdap 05/29/2012, 04/15/2017   Zoster, Live 08/03/2013, 08/21/2019  Covid booster - has received bivalent booster.   Functional Status Survey: Is the patient deaf or have difficulty hearing?: No Does the patient have difficulty seeing, even when wearing glasses/contacts?: Yes Does the patient have difficulty concentrating, remembering, or making decisions?: Yes (concentrating) Does the patient have difficulty walking or climbing stairs?: No Does the patient have difficulty dressing or bathing?: No Does the patient have difficulty doing errands alone such as visiting a doctor's office or shopping?: No Trouble concentrating for years - no changes.   Memory Screen: 6CIT Screen 07/14/2021 10/19/2019 09/24/2018 09/23/2017  What Year? 0 points 0 points 0 points 0 points  What month? 0 points 0 points 0 points 0 points  What time? 0 points 0 points 0 points 0 points  Count back from 20 0 points 0 points 0 points 0 points  Months in reverse 0 points - 0 points 0 points  Repeat phrase 0 points - 0 points 0 points  Total Score 0 - 0 0   Alcohol Screening: [place] Office Visit from 07/14/2021 in [place]  AUDIT-C Score 0      Tobacco: None.   Vision Screening   Right eye Left eye Both eyes  Without correction 20/25-1 20/50 20/20  With correction     Optho/optometry: few months ago. Upcoming appt in January.   Dental: Every 43months.   Exercise: Golf - few days per week.   Advanced Directives:  Has both living will and hcpoa, no changes needed.   History Patient Active Problem List   Diagnosis Date Noted   chronic diastolic chf 36/64/4034   Cervical vertebral fusion 02/14/2018   OA (osteoarthritis) of knee 06/17/2017   Trochanteric bursitis of left hip 07/03/2015   Routine general medical examination at a health care facility 11/18/2014    CARCINOMA, SKIN, SQUAMOUS CELL 07/06/2010   ACTINIC KERATOSIS 07/06/2010   NEOPLASM, MALIGNANT, BASAL CELL, CARCINOMA, HX OF 07/06/2010   Hyperlipidemia 06/08/2009   DEPRESSION 06/08/2009   POSTMENOPAUSAL STATUS 06/08/2009   IRRITABLE BOWEL SYNDROME 02/17/2008   BACK PAIN, CHRONIC 02/17/2008   MIGRAINE HEADACHE 02/05/2007   Essential hypertension 02/05/2007   Past Medical History:  Diagnosis Date   Arthritis    knees, hands   Bursitis of left hip    Cancer ([place])    hx skin cancer   Cataract    Phreesia 09/11/2020   Depression    Depression    Phreesia 09/11/2020   GERD (gastroesophageal reflux disease)    INFREQUENT   Headache    HX MIGRAINES    Heart murmur     MVP 30 yrs ago - no treatment needed   Hyperlipidemia    on statin   Hypertension    "flucuations" has never been on any med   Insomnia    Irritable bowel syndrome    s/p subtotal colectomy '86>diarrhea prone  NEOPLASM, MALIGNANT, BASAL CELL, CARCINOMA, HX OF    Seasonal allergies    Past Surgical History:  Procedure Laterality Date   ABDOMINOPLASTY     ANTERIOR CERVICAL DECOMP/DISCECTOMY FUSION N/A 02/14/2018   Procedure: CERVICAL THREE-FOUR,CERVICAL FOUR-FIVE ACDF, REMOVAL CERVICAL FIVE-SEVEN  PLATE.;  Surgeon: Eustace Moore, MD;  Location: [place];  Service: Neurosurgery;  Laterality: N/A;  anterior   BACK SURGERY  1999   lower back   BREAST ENHANCEMENT SURGERY  1983   BREAST IMPLANT REMOVAL  08/2010   BREAST SURGERY     Augmentation in 1983, Reduction 2011   BUNIONECTOMY     right  and left foot   CERVICAL SPINE SURGERY  12/18/1999   C5/6 and C6/7   CHOLECYSTECTOMY N/A    Phreesia 09/11/2020   COLECTOMY  1986   COLON SURGERY N/A    Phreesia 09/11/2020   COSMETIC SURGERY     EXCISION/RELEASE BURSA HIP Left 07/04/2015   Procedure: LEFT HIP BURSECTOMY WITH ;  Surgeon: Gaynelle Arabian, MD;  Location: [facility];  Service: Orthopedics;  Laterality: Left;   EYE SURGERY     Lasik 2010   eyelid surgery      FRACTURE SURGERY     HYSTEROSCOPY WITH D & C  08/17/2011   Procedure: DILATATION AND CURETTAGE (D&C) /HYSTEROSCOPY;  Surgeon: Margarette Asal;  Location: [facility];  Service: Gynecology;  Laterality: N/A;   INJECTION KNEE Right 07/04/2015   Procedure: CORTISONE INJECTION RIGHT KNEE ;  Surgeon: Gaynelle Arabian, MD;  Location: [facility];  Service: Orthopedics;  Laterality: Right;   JOINT REPLACEMENT N/A    Phreesia 09/11/2020   KNEE ARTHROSCOPY     right x 2   KNEE RECONSTRUCTION, MEDIAL PATELLAR FEMORAL LIGAMENT     right   LASIK  2009    bilateral   left foot surgery     removed foreign object   OPEN SURGICAL REPAIR OF GLUTEAL TENDON Left 07/04/2015   Procedure: GLUTEAL TENDON REPAIR ;  Surgeon: Gaynelle Arabian, MD;  Location: [facility];  Service: Orthopedics;  Laterality: Left;   REDUCTION MAMMAPLASTY Bilateral    SPINE SURGERY N/A    Phreesia 09/11/2020   thumb surgery     TOTAL KNEE ARTHROPLASTY Right 06/17/2017   Procedure: RIGHT TOTAL KNEE ARTHROPLASTY;  Surgeon: Gaynelle Arabian, MD;  Location: [facility];  Service: Orthopedics;  Laterality: Right;   TUBAL LIGATION     Allergies  Allergen Reactions   Nsaids Other (See Comments)    Hurts stomach   Other    Prior to Admission medications   Medication Sig Start Date End Date Taking? Authorizing Provider  acetaminophen (TYLENOL) 500 MG tablet Take 1,000 mg by mouth every 8 (eight) hours as needed for moderate pain or headache.     [provider]  atorvastatin (LIPITOR) 20 MG tablet Take 1 tablet (20 mg total) by mouth every morning. 01/11/21   Wendie Agreste, MD  benzonatate (TESSALON) 100 MG capsule Take 1 capsule (100 mg total) by mouth 2 (two) times daily as needed for cough. 05/01/21   Wendie Agreste, MD  buPROPion (WELLBUTRIN XL) 300 MG 24 hr tablet TAKE 1 TABLET DAILY 12/05/20   Maximiano Coss, NP  Calcium-Phosphorus-Vitamin D (CITRACAL +D3 PO) Take 1,000 Units by mouth daily.     [provider]  cholecalciferol  (VITAMIN D) 1000 units tablet Take 1,000 Units by mouth daily.    [provider]  Cyanocobalamin (VITAMIN B12) 1000 MCG TBCR Take 1,000  mcg by mouth daily.    [provider]  diclofenac sodium (VOLTAREN) 1 % GEL Apply 1 g topically daily as needed.     [provider]  fluorouracil (EFUDEX) 5 % cream Apply 1 application topically See admin instructions. In January applies daily for 3 weeks then off for 1-3 weeks then resume for 3 weeks. "For skin cancers" 05/31/15   [provider]  furosemide (LASIX) 40 MG tablet Take 1 tablet (40 mg total) by mouth daily. 01/11/21   Wendie Agreste, MD  HYDROcodone-acetaminophen (NORCO/VICODIN) 5-325 MG tablet Take 1 tablet by mouth every 6 (six) hours as needed. 11/08/20   [provider]  Liniments (SALONPAS PAIN RELIEF PATCH EX) Apply 2 patches topically daily.    [provider]  loperamide (IMODIUM A-D) 2 MG tablet Take 1 tablet (2 mg total) by mouth 3 (three) times daily as needed for diarrhea or loose stools. 04/13/19   Forrest Moron, MD  loratadine (CLARITIN) 10 MG tablet Take 10 mg by mouth daily.    [provider]  Magnesium 300 MG CAPS Take 300 mg by mouth daily.     [provider]  MAGNESIUM GLYCINATE PO Take 450 mg by mouth.    [provider]  niacinamide 500 MG tablet Take 500 mg by mouth 2 (two) times daily with a meal.    [provider]  potassium chloride (KLOR-CON) 10 MEQ tablet Take 1 tablet (10 mEq total) by mouth daily. 05/02/21   Nahser, Wonda Cheng, MD  promethazine (PHENERGAN) 25 MG tablet Take 1 tablet (25 mg total) by mouth every 6 (six) hours as needed for nausea or vomiting. 01/11/21   Wendie Agreste, MD  Simethicone (PHAZYME PO) Take 1 tablet by mouth every morning.     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  Retired.  Education some college.  2 children.  Caffeine 3-4 glasses of tea/ day.   Social Determinants of Health   Financial Resource Strain: Not on file  Food Insecurity: Not on file  Transportation Needs: Not on file  Physical Activity: Not on file  Stress: Not on file  Social Connections: Not on file  Intimate Partner Violence: Not on file    Review of Systems 13 point review of systems per patient health survey noted.  Negative other than as indicated above or in HPI.    Objective:   Vitals:   07/14/21 0807  BP: 128/72  Pulse: 71  Resp: 16  Temp: 98.1 F (36.7 C)  TempSrc: Temporal  SpO2: 96%  Weight: 169 lb (76.7 kg)  Height:  5\' 7"  (1.702 m)     Physical Exam Constitutional:      Appearance: She is well-developed.  HENT:     Head: Normocephalic and atraumatic.     Right Ear: External ear normal.     Left Ear: External ear normal.  Eyes:     Conjunctiva/sclera: Conjunctivae normal.     Pupils: Pupils are equal, round, and reactive to light.  Neck:     Thyroid: No thyromegaly.  Cardiovascular:     Rate and Rhythm: Normal rate and regular rhythm.     Heart sounds: Normal heart sounds. No murmur heard. Pulmonary:     Effort: Pulmonary effort is normal. No respiratory distress.     Breath sounds: Normal breath sounds. No wheezing.  Abdominal:     General: Bowel  sounds are normal.     Palpations: Abdomen is soft.     Tenderness: There is no abdominal tenderness.  Musculoskeletal:        General: No tenderness. Normal range of motion.     Cervical back: Normal range of motion and neck supple.  Lymphadenopathy:     Cervical: No cervical adenopathy.  Skin:    General: Skin is warm and dry.     Findings: No rash.  Neurological:     Mental Status: She is alert and oriented to person, place, and time.  Psychiatric:        Behavior: Behavior normal.        Thought Content: Thought content normal.       Assessment & Plan:  [name] is a 70 y.o. female . Medicare annual wellness visit, subsequent - Plan: Comprehensive metabolic panel  - - anticipatory guidance as below in AVS, screening labs if needed. Health maintenance items as above in HPI discussed/recommended as applicable.  - no concerning responses on depression, fall, or functional status screening. Any positive responses noted as above. Advanced directives discussed as in CH[facility].   Essential hypertension - Plan: Comprehensive metabolic panel, furosemide (LASIX) 40 MG tablet  - euvolemic, stable. No med changes.   Mixed hyperlipidemia - Plan: Comprehensive metabolic panel, Lipid panel  - stable, tolerating meds, continue statin.   Screening for diabetes mellitus - Plan: Comprehensive metabolic panel  Medication refill - Plan: buPROPion (WELLBUTRIN XL) 300 MG 24 hr tablet  - depression well controlled.   No orders of the defined types were placed in this encounter.  There are no Patient Instructions on file for this visit.    Signed,   Merri Ray, MD [place] 07/14/21 8:42 AM

## 2021-07-16 DIAGNOSIS — Z20828 Contact with and (suspected) exposure to other viral communicable diseases: Secondary | ICD-10-CM | POA: Diagnosis not present

## 2021-08-07 ENCOUNTER — Ambulatory Visit
Admission: RE | Admit: 2021-08-07 | Discharge: 2021-08-07 | Disposition: A | Discharge status: Home / Self Care | Payer: Medicare Other | Source: Ambulatory Visit | Attending: Family Medicine | Admitting: Family Medicine

## 2021-08-07 DIAGNOSIS — E2839 Other primary ovarian failure: Secondary | ICD-10-CM

## 2021-08-07 DIAGNOSIS — Z780 Asymptomatic menopausal state: Secondary | ICD-10-CM | POA: Diagnosis not present

## 2021-08-07 DIAGNOSIS — M8589 Other specified disorders of bone density and structure, multiple sites: Secondary | ICD-10-CM | POA: Diagnosis not present

## 2021-08-11 DIAGNOSIS — M65331 Trigger finger, right middle finger: Secondary | ICD-10-CM | POA: Diagnosis not present

## 2021-08-14 DIAGNOSIS — Z86018 Personal history of other benign neoplasm: Secondary | ICD-10-CM | POA: Diagnosis not present

## 2021-08-14 DIAGNOSIS — L570 Actinic keratosis: Secondary | ICD-10-CM | POA: Diagnosis not present

## 2021-08-14 DIAGNOSIS — D485 Neoplasm of uncertain behavior of skin: Secondary | ICD-10-CM | POA: Diagnosis not present

## 2021-08-14 DIAGNOSIS — L578 Other skin changes due to chronic exposure to nonionizing radiation: Secondary | ICD-10-CM | POA: Diagnosis not present

## 2021-08-14 DIAGNOSIS — C44722 Squamous cell carcinoma of skin of right lower limb, including hip: Secondary | ICD-10-CM | POA: Diagnosis not present

## 2021-08-14 DIAGNOSIS — D225 Melanocytic nevi of trunk: Secondary | ICD-10-CM | POA: Diagnosis not present

## 2021-08-14 DIAGNOSIS — L821 Other seborrheic keratosis: Secondary | ICD-10-CM | POA: Diagnosis not present

## 2021-08-14 DIAGNOSIS — L814 Other melanin hyperpigmentation: Secondary | ICD-10-CM | POA: Diagnosis not present

## 2021-08-14 DIAGNOSIS — Z85828 Personal history of other malignant neoplasm of skin: Secondary | ICD-10-CM | POA: Diagnosis not present

## 2021-09-11 HISTORY — PX: TRIGGER FINGER RELEASE: SHX641

## 2021-09-15 DIAGNOSIS — H524 Presbyopia: Secondary | ICD-10-CM | POA: Diagnosis not present

## 2021-09-15 DIAGNOSIS — H532 Diplopia: Secondary | ICD-10-CM | POA: Diagnosis not present

## 2021-10-12 ENCOUNTER — Ambulatory Visit (INDEPENDENT_AMBULATORY_CARE_PROVIDER_SITE_OTHER): Payer: Medicare Other | Admitting: Cardiovascular Disease

## 2021-10-12 ENCOUNTER — Other Ambulatory Visit: Payer: Self-pay

## 2021-10-12 ENCOUNTER — Encounter: Payer: Self-pay | Admitting: Cardiovascular Disease

## 2021-10-12 VITALS — BP 136/70 | HR 90 | Ht 67.0 in | Wt 170.6 lb

## 2021-10-12 DIAGNOSIS — R600 Localized edema: Secondary | ICD-10-CM | POA: Diagnosis not present

## 2021-10-12 DIAGNOSIS — I5043 Acute on chronic combined systolic (congestive) and diastolic (congestive) heart failure: Secondary | ICD-10-CM | POA: Diagnosis not present

## 2021-10-12 MED ORDER — POTASSIUM CHLORIDE ER 10 MEQ PO TBCR: 10.0000 meq | EXTENDED_RELEASE_TABLET | Freq: Every day | ORAL | 11 refills | Status: DC

## 2021-10-12 NOTE — Patient Instructions (Signed)
Medication Instructions:  Your physician recommends that you continue on your current medications as directed. Please refer to the Current Medication list given to you today.  *If you need a refill on your cardiac medications before your next appointment, please call your pharmacy*   Lab Work: TODAY:  BMP  If you have labs (blood work) drawn today and your tests are completely normal, you will receive your results only by: [place] (if you have MyChart) OR A paper copy in the mail If you have any lab test that is abnormal or we need to change your treatment, we will call you to review the results.   Testing/Procedures: None ordered   Follow-Up: At [facility], you and your health needs are our priority.  As part of our continuing mission to provide you with exceptional heart care, we have created designated Provider Care Teams.  These Care Teams include your primary Cardiologist (physician) and Advanced Practice Providers (APPs -  Physician Assistants and Nurse Practitioners) who all work together to provide you with the care you need, when you need it.  We recommend signing up for the patient portal called "MyChart".  Sign up information is provided on this After Visit Summary.  MyChart is used to connect with patients for Virtual Visits (Telemedicine).  Patients are able to view lab/test results, encounter notes, upcoming appointments, etc.  Non-urgent messages can be sent to your provider as well.   To learn more about what you can do with MyChart, go to [email or web address].    Your next appointment:   AS NEEDED  The format for your next appointment:     Provider:       Other Instructions

## 2021-10-12 NOTE — Progress Notes (Signed)
Cardiology Office Note:    Date:  10/12/2021   ID:  [name], DOB September 10, 1951, MRN [id number]  PCP:  Wendie Agreste, MD   [facility] Providers Cardiologist:  [name] to update primary MD,subspecialty MD or APP then REFRESH:1}    Referring MD: Wendie Agreste, MD   Chief Complaint  Patient presents with   Hypertension          Aug. 23, 2022   [name] is a 71 y.o. female with a hx of hyperlipidemia, MVP and leg edema.  We are asked to see her today by Dr. Nyoka Cowden for further evaluation of her leg edema.  She started taking furosemid since 1987 after knee surgery .  She gains weight if she runs out of the lasix  Has not seen a cardiologist , no previous testing   Eats a fairly unrestricted diet . Usually craves sweets instead of salty   Takes her BP at home  Average is 126 / 69   Plays golf 3 times a week  No cardio exercise   Non smoker ,  No  etoh  Mother diet of MI  Father died of cancer  Brother and sisters with HTN  Feb. 2, 2023 [name] is seen today for follow up of her HTN, MVP,  Leg edema is controlled.  On lasix and potassium   Echocardiogram revealed normal function.  She has a soft murmur and our echo suggested a bicuspid aortic valve.  I am not sure that the aortic valve was adequately visualized enough to absolutely diagnose it as a bicuspid valve.  Her murmur is very soft on exam.  It does not sound like a aortic stenosis murmur.  There is no evidence of aortic stenosis or aortic insufficiency.  In addition, her aorta is normal size.   Dr. Nyoka Cowden has had her on Lasix for years.  We added potassium tablet.  She is doing quite well and it looks like we will probably be seeing her on an as-needed basis.  We will asked Dr. Nyoka Cowden to pick up management of her potassium chloride.  Past Medical History:  Diagnosis Date   Arthritis    knees, hands   Bursitis of left hip    Cancer ([place])    hx skin cancer   Cataract     Phreesia 09/11/2020   Depression    Depression    Phreesia 09/11/2020   GERD (gastroesophageal reflux disease)    INFREQUENT   Headache    HX MIGRAINES    Heart murmur     MVP 30 yrs ago - no treatment needed   Hyperlipidemia    on statin   Hypertension    "flucuations" has never been on any med   Insomnia    Irritable bowel syndrome    s/p subtotal colectomy '86>diarrhea prone   NEOPLASM, MALIGNANT, BASAL CELL, CARCINOMA, HX OF    Seasonal allergies     Past Surgical History:  Procedure Laterality Date   ABDOMINOPLASTY     ANTERIOR CERVICAL DECOMP/DISCECTOMY FUSION N/A 02/14/2018   Procedure: CERVICAL THREE-FOUR,CERVICAL FOUR-FIVE ACDF, REMOVAL CERVICAL FIVE-SEVEN  PLATE.;  Surgeon: Eustace Moore, MD;  Location: [facility];  Service: Neurosurgery;  Laterality: N/A;  anterior   BACK SURGERY  1999   lower back   BREAST ENHANCEMENT SURGERY  1983   BREAST IMPLANT REMOVAL  08/2010   BREAST SURGERY     Augmentation in 1983, Reduction 2011   BUNIONECTOMY  right  and left foot   CERVICAL SPINE SURGERY  12/18/1999   C5/6 and C6/7   CHOLECYSTECTOMY N/A    Phreesia 09/11/2020   COLECTOMY  1986   COLON SURGERY N/A    Phreesia 09/11/2020   COSMETIC SURGERY     EXCISION/RELEASE BURSA HIP Left 07/04/2015   Procedure: LEFT HIP BURSECTOMY WITH ;  Surgeon: Gaynelle Arabian, MD;  [facility];  Service: Orthopedics;  Laterality: Left;   EYE SURGERY     Lasik 2010   eyelid surgery     FRACTURE SURGERY     HYSTEROSCOPY WITH D & C  08/17/2011   Procedure: DILATATION AND CURETTAGE (D&C) /HYSTEROSCOPY;  Surgeon: Margarette Asal;  Location: [facility];  Service: Gynecology;  Laterality: N/A;   INJECTION KNEE Right 07/04/2015   Procedure: CORTISONE INJECTION RIGHT KNEE ;  Surgeon: Gaynelle Arabian, MD;  [facility];  Service: Orthopedics;  Laterality: Right;   JOINT REPLACEMENT N/A    Phreesia 09/11/2020   KNEE ARTHROSCOPY     right x 2   KNEE RECONSTRUCTION, MEDIAL PATELLAR FEMORAL  LIGAMENT     right   LASIK  2009    bilateral   left foot surgery     removed foreign object   OPEN SURGICAL REPAIR OF GLUTEAL TENDON Left 07/04/2015   Procedure: GLUTEAL TENDON REPAIR ;  Surgeon: Gaynelle Arabian, MD;  [facility];  Service: Orthopedics;  Laterality: Left;   REDUCTION MAMMAPLASTY Bilateral    SPINE SURGERY N/A    Phreesia 09/11/2020   thumb surgery     TOTAL KNEE ARTHROPLASTY Right 06/17/2017   Procedure: RIGHT TOTAL KNEE ARTHROPLASTY;  Surgeon: Gaynelle Arabian, MD;  [facility];  Service: Orthopedics;  Laterality: Right;   TUBAL LIGATION      Current Medications: Current Meds  Medication Sig   acetaminophen (TYLENOL) 500 MG tablet Take 1,000 mg by mouth every 8 (eight) hours as needed for moderate pain or headache.    atorvastatin (LIPITOR) 20 MG tablet Take 1 tablet (20 mg total) by mouth every morning.   buPROPion (WELLBUTRIN XL) 300 MG 24 hr tablet Take 1 tablet (300 mg total) by mouth daily.   Cyanocobalamin (VITAMIN B12) 1000 MCG TBCR Take 1,000 mcg by mouth daily.   diclofenac sodium (VOLTAREN) 1 % GEL Apply 1 g topically daily as needed.    fluorouracil (EFUDEX) 5 % cream Apply 1 application topically See admin instructions. In January applies daily for 3 weeks then off for 1-3 weeks then resume for 3 weeks. "For skin cancers"   fluticasone (FLONASE) 50 MCG/ACT nasal spray    furosemide (LASIX) 40 MG tablet Take 1 tablet (40 mg total) by mouth daily.   HYDROcodone-acetaminophen (NORCO/VICODIN) 5-325 MG tablet Take 1 tablet by mouth every 6 (six) hours as needed.   Liniments (SALONPAS PAIN RELIEF PATCH EX) Apply 2 patches topically daily.   loperamide (IMODIUM A-D) 2 MG tablet Take 1 tablet (2 mg total) by mouth 3 (three) times daily as needed for diarrhea or loose stools.   loratadine (CLARITIN) 10 MG tablet Take 10 mg by mouth daily.   Magnesium 300 MG CAPS Take 300 mg by mouth daily.    MAGNESIUM GLYCINATE PO Take 450 mg by mouth.   niacinamide 500  MG tablet Take 500 mg by mouth 2 (two) times daily with a meal.   promethazine (PHENERGAN) 25 MG tablet Take 1 tablet (25 mg total) by mouth every 6 (six) hours as needed for nausea  or vomiting.   Simethicone (PHAZYME PO) Take 1 tablet by mouth every morning.    [DISCONTINUED] potassium chloride (KLOR-CON) 10 MEQ tablet Take 1 tablet (10 mEq total) by mouth daily.     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  Retired.  Education some college.  2 children.  Caffeine 3-4 glasses of tea/ day.   Social Determinants of Health   Financial Resource Strain: Not on file  Food Insecurity: Not on file  Transportation Needs: Not on file  Physical Activity: Not on file  Stress: Not on file  Social Connections: Not on file     Family History: The patient's family history includes Breast cancer in her sister; Cancer in her daughter and father; Diabetes in her brother; Heart disease in her brother, brother, and mother; High Cholesterol in her sister; Hypertension in her brother, brother, daughter, mother, and sister; Neuropathy in her sister; Psoriasis in her sister; Stomach cancer (age of onset: 59) in her father; Thyroid disease in her sister. There is no history of  Colon cancer.  ROS:   Please see the history of present illness.     All other systems reviewed and are negative.  EKGs/Labs/Other Studies Reviewed:    The following studies were reviewed today:   EKG:    Recent Labs: 07/14/2021: ALT 17; BUN 20; Creatinine, Ser 0.94; Potassium 4.1; Sodium 142  Recent Lipid Panel    Component Value Date/Time   CHOL 165 07/14/2021 0852   CHOL 168 05/27/2020 1133   TRIG 92.0 07/14/2021 0852   HDL 64.50 07/14/2021 0852   HDL 58 05/27/2020 1133   CHOLHDL 3 07/14/2021 0852   VLDL 18.4 07/14/2021 0852   LDLCALC 82 07/14/2021 0852   LDLCALC 89 05/27/2020 1133     Risk Assessment/Calculations:           Physical Exam:      Physical Exam: Blood pressure 136/70, pulse 90, height 5\' 7"  (1.702 m), weight 170 lb 9.6 oz (77.4 kg), SpO2 98 %.  GEN:  Well nourished, well developed in no acute distress HEENT: Normal NECK: No JVD; No carotid bruits LYMPHATICS: No lymphadenopathy CARDIAC: RRR ,  soft systolic murur  RESPIRATORY:  Clear to auscultation without rales, wheezing or rhonchi  ABDOMEN: Soft, non-tender, non-distended MUSCULOSKELETAL:  No edema; No deformity  SKIN: Warm and dry NEUROLOGIC:  Alert and oriented x 3   ASSESSMENT:    1. Leg edema   2. chronic diastolic chf     PLAN:       Possible chronic diastolic dysfunction: She has normal left ventricular systolic function.  She does have grade 1 diastolic dysfunction.  So far this has been well controlled with her Lasix and potassium.  2.  Possible bicuspid AV:   the echo from Sept. 2022 suggested that the AV was bicuspid.   The valve was not visualized very well and I cannot say for sure whether or not it is bicuspid.  She does not have other associated issues with the valve such as there is no aortic stenosis or aortic insufficiency.  In addition the aorta is not dilated.  At this point I do not think that she needs any further work-up.  We discussed doing a transesophageal  echo but she is not inclined to do that at this time.  If her murmur worsens then we should reevaluate her.      Medication Adjustments/Labs and Tests Ordered: Current medicines are reviewed at length with the patient today.  Concerns regarding medicines are outlined above.  Orders Placed This Encounter  Procedures   Basic Metabolic Panel (BMET)   Meds ordered this encounter  Medications   potassium chloride (KLOR-CON) 10 MEQ tablet    Sig: Take 1 tablet (10 mEq total) by mouth daily.    Dispense:  30 tablet    Refill:  11    Patient Instructions  Medication Instructions:  Your physician recommends that you continue on your current medications as directed. Please refer to the Current Medication list given to you today.  *If you need a refill on your cardiac medications before your next appointment, please call your pharmacy*   Lab Work: TODAY:  BMP  If you have labs (blood work) drawn today and your tests are completely normal, you will receive your results only by: [facility] (if you have MyChart) OR A paper copy in the mail If you have any lab test that is abnormal or we need to change your treatment, we will call you to review the results.   Testing/Procedures: None ordered   Follow-Up: At [facility], you and your health needs are our priority.  As part of our continuing mission to provide you with exceptional heart care, we have created designated Provider Care Teams.  These Care Teams include your primary Cardiologist (physician) and Advanced Practice Providers (APPs -  Physician Assistants and Nurse Practitioners) who all work together to provide you with the care you need, when you need it.  We recommend signing up for the patient portal called "MyChart".  Sign up information is provided on this After Visit Summary.  MyChart is used to connect with patients for Virtual Visits (Telemedicine).  Patients are able to view lab/test results, encounter notes,  upcoming appointments, etc.  Non-urgent messages can be sent to your provider as well.   To learn more about what you can do with MyChart, go to [email or web address].    Your next appointment:   AS NEEDED  The format for your next appointment:     Provider:       Other Instructions     Signed, Mertie Moores, MD  10/12/2021 2:39 PM    [place]

## 2021-10-13 LAB — BASIC METABOLIC PANEL
BUN/Creatinine Ratio: 15 (ref 12–28)
BUN: 17 mg/dL (ref 8–27)
CO2: 27 mmol/L (ref 20–29)
Calcium: 10.1 mg/dL (ref 8.7–10.3)
Chloride: 102 mmol/L (ref 96–106)
Creatinine, Ser: 1.16 mg/dL — ABNORMAL HIGH (ref 0.57–1.00)
Glucose: 104 mg/dL — ABNORMAL HIGH (ref 70–99)
Potassium: 4.5 mmol/L (ref 3.5–5.2)
Sodium: 143 mmol/L (ref 134–144)
eGFR: 50 mL/min/{1.73_m2} — ABNORMAL LOW (ref >59)

## 2021-10-16 ENCOUNTER — Telehealth: Payer: Self-pay

## 2021-10-16 MED ORDER — FUROSEMIDE 40 MG PO TABS: ORAL_TABLET | ORAL | 3 refills | Status: DC

## 2021-10-16 MED ORDER — POTASSIUM CHLORIDE ER 10 MEQ PO TBCR: EXTENDED_RELEASE_TABLET | ORAL | 3 refills | Status: DC

## 2021-10-16 NOTE — Telephone Encounter (Signed)
Called and provided pt with Dr Elmarie Shiley recommendation for medication changes. Pt states that she understands and agrees to plan.

## 2021-10-16 NOTE — Telephone Encounter (Signed)
-----   Message from Thayer Headings, MD sent at 10/13/2021  6:22 PM EST ----- Creantinine is a little elevated.   She may do better on a lower dose of lasix.  Lets have her try reducing the lasix and potassium to just mondays, Wednesday, fridays , and satudays . If the keeps her fluid controlled, the lower dose will put less strain on her kidneys

## 2021-10-26 DIAGNOSIS — L905 Scar conditions and fibrosis of skin: Secondary | ICD-10-CM | POA: Diagnosis not present

## 2021-10-26 DIAGNOSIS — C44722 Squamous cell carcinoma of skin of right lower limb, including hip: Secondary | ICD-10-CM | POA: Diagnosis not present

## 2021-11-07 DIAGNOSIS — H5021 Vertical strabismus, right eye: Secondary | ICD-10-CM | POA: Diagnosis not present

## 2021-11-07 DIAGNOSIS — H532 Diplopia: Secondary | ICD-10-CM | POA: Diagnosis not present

## 2021-11-09 DIAGNOSIS — Z20822 Contact with and (suspected) exposure to covid-19: Secondary | ICD-10-CM | POA: Diagnosis not present

## 2021-11-19 DIAGNOSIS — Z20822 Contact with and (suspected) exposure to covid-19: Secondary | ICD-10-CM | POA: Diagnosis not present

## 2021-12-01 DIAGNOSIS — H5021 Vertical strabismus, right eye: Secondary | ICD-10-CM | POA: Diagnosis not present

## 2021-12-04 DIAGNOSIS — H5021 Vertical strabismus, right eye: Secondary | ICD-10-CM | POA: Diagnosis not present

## 2021-12-05 DIAGNOSIS — H5021 Vertical strabismus, right eye: Secondary | ICD-10-CM | POA: Diagnosis not present

## 2021-12-05 DIAGNOSIS — H4311 Vitreous hemorrhage, right eye: Secondary | ICD-10-CM | POA: Diagnosis not present

## 2021-12-07 DIAGNOSIS — Z20822 Contact with and (suspected) exposure to covid-19: Secondary | ICD-10-CM | POA: Diagnosis not present

## 2021-12-11 DIAGNOSIS — H4311 Vitreous hemorrhage, right eye: Secondary | ICD-10-CM | POA: Diagnosis not present

## 2021-12-11 DIAGNOSIS — H2513 Age-related nuclear cataract, bilateral: Secondary | ICD-10-CM | POA: Diagnosis not present

## 2021-12-11 DIAGNOSIS — H43812 Vitreous degeneration, left eye: Secondary | ICD-10-CM | POA: Diagnosis not present

## 2021-12-12 DIAGNOSIS — H43812 Vitreous degeneration, left eye: Secondary | ICD-10-CM | POA: Diagnosis not present

## 2021-12-12 DIAGNOSIS — H2513 Age-related nuclear cataract, bilateral: Secondary | ICD-10-CM | POA: Diagnosis not present

## 2021-12-12 DIAGNOSIS — H4311 Vitreous hemorrhage, right eye: Secondary | ICD-10-CM | POA: Diagnosis not present

## 2021-12-19 ENCOUNTER — Telehealth: Payer: Self-pay | Admitting: *Deleted

## 2021-12-19 DIAGNOSIS — H4311 Vitreous hemorrhage, right eye: Secondary | ICD-10-CM | POA: Diagnosis not present

## 2021-12-19 NOTE — Telephone Encounter (Signed)
? ?  Pre-operative Risk Assessment  ?  ?Patient Name: [name]  ?DOB: [birth year] ?MRN: [id number]  ? ?  ? ?Request for Surgical Clearance   ? ?Procedure:   VITRECTOMY, POSSIBLE ENDOLASER, POSSIBLE GAS FLUID EXCHANGE vs SILICONE OIL ? ?Date of Surgery:  Clearance 12/21/21                              ?   ?Surgeon:  DR. Sherlynn Stalls ?Surgeon's Group or Practice Name:  [facility] ?Phone number:  [phone] ?Fax number:  [phone] ?  ?Type of Clearance Requested:   ?- Medical ; PER DR. SANDERS REQUEST TO HOLD LASIX x 1 DAY PRIOR ?  ?Type of Anesthesia:  MAC ?  ?Additional requests/questions:   ? ?Signed, ?Julaine Hua   ?12/19/2021, 10:47 AM  ? ?

## 2021-12-19 NOTE — Telephone Encounter (Signed)
? ?  Primary Cardiologist: Mertie Moores, MD ? ?Chart reviewed as part of pre-operative protocol coverage. Given past medical history and time since last visit, based on ACC/AHA guidelines, Tamelia Michalowski would be at acceptable risk for the planned procedure without further cardiovascular testing.  ? ?Patient may hold Lasix (furosemide) prior to her procedure.  ? ?I will route this recommendation to the requesting party via Epic fax function and remove from pre-op pool. ? ?Please call with questions. ? ?Emmaline Life, NP-C ? ?  ?12/19/2021, 11:11 AM ?[facility] ?[address], [address] ?Office [phone] Fax [phone] ? ? ?

## 2021-12-21 DIAGNOSIS — H4311 Vitreous hemorrhage, right eye: Secondary | ICD-10-CM | POA: Diagnosis not present

## 2021-12-21 DIAGNOSIS — Z9889 Other specified postprocedural states: Secondary | ICD-10-CM | POA: Insufficient documentation

## 2021-12-21 DIAGNOSIS — H33321 Round hole, right eye: Secondary | ICD-10-CM | POA: Diagnosis not present

## 2021-12-22 DIAGNOSIS — H31091 Other chorioretinal scars, right eye: Secondary | ICD-10-CM | POA: Diagnosis not present

## 2021-12-22 DIAGNOSIS — H33311 Horseshoe tear of retina without detachment, right eye: Secondary | ICD-10-CM | POA: Diagnosis not present

## 2021-12-28 DIAGNOSIS — Z20822 Contact with and (suspected) exposure to covid-19: Secondary | ICD-10-CM | POA: Diagnosis not present

## 2022-01-02 DIAGNOSIS — Z20822 Contact with and (suspected) exposure to covid-19: Secondary | ICD-10-CM | POA: Diagnosis not present

## 2022-01-02 DIAGNOSIS — H31091 Other chorioretinal scars, right eye: Secondary | ICD-10-CM | POA: Diagnosis not present

## 2022-01-11 ENCOUNTER — Encounter: Payer: Self-pay | Admitting: Family Medicine

## 2022-01-11 ENCOUNTER — Ambulatory Visit (INDEPENDENT_AMBULATORY_CARE_PROVIDER_SITE_OTHER): Payer: Medicare Other | Admitting: Family Medicine

## 2022-01-11 VITALS — BP 132/76 | HR 70 | Temp 98.0°F | Resp 16 | Ht 67.0 in | Wt 170.4 lb

## 2022-01-11 DIAGNOSIS — F339 Major depressive disorder, recurrent, unspecified: Secondary | ICD-10-CM

## 2022-01-11 DIAGNOSIS — R7989 Other specified abnormal findings of blood chemistry: Secondary | ICD-10-CM

## 2022-01-11 DIAGNOSIS — Z8669 Personal history of other diseases of the nervous system and sense organs: Secondary | ICD-10-CM | POA: Diagnosis not present

## 2022-01-11 DIAGNOSIS — Z760 Encounter for issue of repeat prescription: Secondary | ICD-10-CM | POA: Diagnosis not present

## 2022-01-11 DIAGNOSIS — E782 Mixed hyperlipidemia: Secondary | ICD-10-CM | POA: Diagnosis not present

## 2022-01-11 DIAGNOSIS — R600 Localized edema: Secondary | ICD-10-CM | POA: Diagnosis not present

## 2022-01-11 DIAGNOSIS — Z20822 Contact with and (suspected) exposure to covid-19: Secondary | ICD-10-CM | POA: Diagnosis not present

## 2022-01-11 LAB — COMPREHENSIVE METABOLIC PANEL
ALT: 14 U/L (ref 0–35)
AST: 20 U/L (ref 0–37)
Albumin: 4.8 g/dL (ref 3.5–5.2)
Alkaline Phosphatase: 62 U/L (ref 39–117)
BUN: 19 mg/dL (ref 6–23)
CO2: 25 mEq/L (ref 19–32)
Calcium: 9.7 mg/dL (ref 8.4–10.5)
Chloride: 106 mEq/L (ref 96–112)
Creatinine, Ser: 0.79 mg/dL (ref 0.40–1.20)
GFR: 75.15 mL/min (ref >60.00)
Glucose, Bld: 81 mg/dL (ref 70–99)
Potassium: 4.4 mEq/L (ref 3.5–5.1)
Sodium: 141 mEq/L (ref 135–145)
Total Bilirubin: 0.7 mg/dL (ref 0.2–1.2)
Total Protein: 6.7 g/dL (ref 6.0–8.3)

## 2022-01-11 LAB — LIPID PANEL
Cholesterol: 153 mg/dL (ref 0–200)
HDL: 59.3 mg/dL (ref >39.00)
LDL Cholesterol: 72 mg/dL (ref 0–99)
NonHDL: 94
Total CHOL/HDL Ratio: 3
Triglycerides: 108 mg/dL (ref 0.0–149.0)
VLDL: 21.6 mg/dL (ref 0.0–40.0)

## 2022-01-11 MED ORDER — ATORVASTATIN CALCIUM 20 MG PO TABS: 20.0000 mg | ORAL_TABLET | Freq: Every morning | ORAL | 3 refills | Status: DC

## 2022-01-11 MED ORDER — BUPROPION HCL ER (XL) 300 MG PO TB24: 300.0000 mg | ORAL_TABLET | Freq: Every day | ORAL | 3 refills | Status: DC

## 2022-01-11 MED ORDER — PROMETHAZINE HCL 25 MG PO TABS: 25.0000 mg | ORAL_TABLET | Freq: Four times a day (QID) | ORAL | 1 refills | Status: DC | PRN

## 2022-01-11 NOTE — Patient Instructions (Signed)
I will recheck kidney function test to make sure it has improved since the medication change from cardiology.  If you are having more difficulty with swelling at that medication dosage I would recommend discussing options with cardiology.  No other med changes for now.  Follow-up in 6 months for physical but please let me know if there are questions sooner.  Take care! ?

## 2022-01-11 NOTE — Progress Notes (Signed)
? ?Subjective:  ?Patient ID: [name], female    DOB: 1950/10/04  Age: 71 y.o. MRN: [id number] ? ?CC:  ?Chief Complaint  ?Patient presents with  ? Hyperlipidemia  ?  Pt her for recheck , no concern recent refill completed   ? Migraine  ?  Recent refill notes no concerns migraines are still infrequent   ? ? ?HPI ?[name] presents for  ? ?Hypertension: ?Followed by cardiology, history of hypertensive heart disease, chronic diastolic CHF.  Treated with Lasix, potassium and Lasix as tolerated.  Furosemide daily.  Last creatinine slightly higher than previous baseline.  Note reviewed from cardiology from that lab results indicating decreasing the Lasix and potassium to just Monday Wednesday Fridays and Saturdays. Does have some increased swelling on off days - has not discussed with cardiology.  ? ?Home readings:none.  ?BP Readings from Last 3 Encounters:  ?01/11/22 132/76  ?10/12/21 136/70  ?07/14/21 128/72  ? ?Lab Results  ?Component Value Date  ? CREATININE 1.16 (H) 10/12/2021  ? ?History of migraine headaches ?Phenergan as needed.  1 per month, not severe - just persists until taking phenergan after 1.5 days - resolves HA with 1/2 phenergan.  ? ?Hyperlipidemia: ?Lipitor 20 mg daily, no new myalgias/side effects.  ?Lab Results  ?Component Value Date  ? CHOL 165 07/14/2021  ? HDL 64.50 07/14/2021  ? [place] 82 07/14/2021  ? TRIG 92.0 07/14/2021  ? CHOLHDL 3 07/14/2021  ? ?Lab Results  ?Component Value Date  ? ALT 17 07/14/2021  ? AST 22 07/14/2021  ? ALKPHOS 68 07/14/2021  ? BILITOT 0.6 07/14/2021  ? ?Depression: ?Wellbutrin '300mg'$  QD - doing well, managing depression. No new side effects.  ? ?  01/11/2022  ? 10:57 AM 07/14/2021  ?  8:13 AM 05/01/2021  ?  2:33 PM 01/11/2021  ?  2:20 PM 05/27/2020  ? 11:05 AM  ?Depression screen PHQ 2/9  ?Decreased Interest 0 0 0 0 0  ?Down, Depressed, Hopeless 0 0 0 0 0  ?PHQ - 2 Score 0 0 0 0 0  ?Altered sleeping 3 3     ?Tired, decreased energy 0 0     ?Change in  appetite 0 0     ?Feeling bad or failure about yourself  0 0     ?Trouble concentrating 0 3     ?Moving slowly or fidgety/restless 0 0     ?Suicidal thoughts 0 0     ?PHQ-9 Score 3 6     ? ? ?On prednisone gtts for retinal tear after other eye procedure. Getting better.  ? ?History ?Patient Active Problem List  ? Diagnosis Date Noted  ? chronic diastolic chf 99/24/2683  ? Cervical vertebral fusion 02/14/2018  ? OA (osteoarthritis) of knee 06/17/2017  ? Trochanteric bursitis of left hip 07/03/2015  ? Routine general medical examination at a health care facility 11/18/2014  ? CARCINOMA, SKIN, SQUAMOUS CELL 07/06/2010  ? ACTINIC KERATOSIS 07/06/2010  ? NEOPLASM, MALIGNANT, BASAL CELL, CARCINOMA, HX OF 07/06/2010  ? Hyperlipidemia 06/08/2009  ? DEPRESSION 06/08/2009  ? POSTMENOPAUSAL STATUS 06/08/2009  ? IRRITABLE BOWEL SYNDROME 02/17/2008  ? BACK PAIN, CHRONIC 02/17/2008  ? MIGRAINE HEADACHE 02/05/2007  ? Essential hypertension 02/05/2007  ? ?Past Medical History:  ?Diagnosis Date  ? Arthritis   ? knees, hands  ? Bursitis of left hip   ? Cancer [facility])   ? hx skin cancer  ? Cataract   ? Phreesia 09/11/2020  ? Depression   ? Depression   ?  Phreesia 09/11/2020  ? GERD (gastroesophageal reflux disease)   ? INFREQUENT  ? Headache   ? HX MIGRAINES   ? Heart murmur   ?  MVP 30 yrs ago - no treatment needed  ? Hyperlipidemia   ? on statin  ? Hypertension   ? "flucuations" has never been on any med  ? Insomnia   ? Irritable bowel syndrome   ? s/p subtotal colectomy '86>diarrhea prone  ? NEOPLASM, MALIGNANT, BASAL CELL, CARCINOMA, HX OF   ? Seasonal allergies   ? ?Past Surgical History:  ?Procedure Laterality Date  ? ABDOMINOPLASTY    ? ANTERIOR CERVICAL DECOMP/DISCECTOMY FUSION N/A 02/14/2018  ? Procedure: CERVICAL THREE-FOUR,CERVICAL FOUR-FIVE ACDF, REMOVAL CERVICAL FIVE-SEVEN  PLATE.;  Surgeon: Eustace Moore, MD;  Location: [facility];  Service: Neurosurgery;  Laterality: N/A;  anterior  ? [place]  ? lower back  ?  [place]  ? BREAST IMPLANT REMOVAL  08/2010  ? BREAST SURGERY    ? Augmentation in 1983, Reduction 2011  ? BUNIONECTOMY    ? right  and left foot  ? CERVICAL SPINE SURGERY  12/18/1999  ? C5/6 and C6/7  ? CHOLECYSTECTOMY N/A   ? Phreesia 09/11/2020  ? COLECTOMY  1986  ? COLON SURGERY N/A   ? Phreesia 09/11/2020  ? COSMETIC SURGERY    ? EXCISION/RELEASE BURSA HIP Left 07/04/2015  ? Procedure: LEFT HIP BURSECTOMY WITH ;  Surgeon: Gaynelle Arabian, MD;  [facility];  Service: Orthopedics;  Laterality: Left;  ? EYE SURGERY    ? Lasik 2010  ? eyelid surgery    ? FRACTURE SURGERY    ? HYSTEROSCOPY WITH D & C  08/17/2011  ? Procedure: DILATATION AND CURETTAGE (D&C) /HYSTEROSCOPY;  Surgeon: Margarette Asal;  Location: [facility];  Service: Gynecology;  Laterality: N/A;  ? INJECTION KNEE Right 07/04/2015  ? Procedure: CORTISONE INJECTION RIGHT KNEE ;  Surgeon: Gaynelle Arabian, MD;  [facility];  Service: Orthopedics;  Laterality: Right;  ? JOINT REPLACEMENT N/A   ? Phreesia 09/11/2020  ? KNEE ARTHROSCOPY    ? right x 2  ? KNEE RECONSTRUCTION, MEDIAL PATELLAR FEMORAL LIGAMENT    ? right  ? LASIK  2009  ?  bilateral  ? left foot surgery    ? removed foreign object  ? OPEN SURGICAL REPAIR OF GLUTEAL TENDON Left 07/04/2015  ? Procedure: GLUTEAL TENDON REPAIR ;  Surgeon: Gaynelle Arabian, MD;  [facility];  Service: Orthopedics;  Laterality: Left;  ? REDUCTION MAMMAPLASTY Bilateral   ? SPINE SURGERY N/A   ? Phreesia 09/11/2020  ? thumb surgery    ? TOTAL KNEE ARTHROPLASTY Right 06/17/2017  ? Procedure: RIGHT TOTAL KNEE ARTHROPLASTY;  Surgeon: Gaynelle Arabian, MD;  [facility];  Service: Orthopedics;  Laterality: Right;  ? TRIGGER FINGER RELEASE Right 09/11/2021  ? TUBAL LIGATION    ? ?Allergies  ?Allergen Reactions  ? Nsaids Other (See Comments)  ?  Hurts stomach  ? Other   ? ?Prior to Admission medications   ?Medication Sig Start Date End Date Taking? Authorizing Provider  ?acetaminophen (TYLENOL)  500 MG tablet Take 1,000 mg by mouth every 8 (eight) hours as needed for moderate pain or headache.    Yes [provider]  ?atorvastatin (LIPITOR) 20 MG tablet Take 1 tablet (20 mg total) by mouth every morning. 01/11/21  Yes Wendie Agreste, MD  ?buPROPion (WELLBUTRIN XL) 300 MG 24 hr tablet Take 1  tablet (300 mg total) by mouth daily. 07/14/21  Yes Wendie Agreste, MD  ?Cyanocobalamin (VITAMIN B12) 1000 MCG TBCR Take 1,000 mcg by mouth daily.   Yes [provider]  ?diclofenac sodium (VOLTAREN) 1 % GEL Apply 1 g topically daily as needed.    Yes [provider]  ?fluorouracil (EFUDEX) 5 % cream Apply 1 application topically See admin instructions. In January applies daily for 3 weeks then off for 1-3 weeks then resume for 3 weeks. "For skin cancers" 05/31/15  Yes [provider]  ?fluticasone Asencion Islam) 50 MCG/ACT nasal spray  07/10/21  Yes [provider]  ?furosemide (LASIX) 40 MG tablet Take 1 tablet by mouth on Monday, Wednesday, Friday and Saturday each week 10/16/21  Yes Nahser, Wonda Cheng, MD  ?HYDROcodone-acetaminophen (NORCO/VICODIN) 5-325 MG tablet Take 1 tablet by mouth every 6 (six) hours as needed. 11/08/20  Yes [provider]  ?Liniments (SALONPAS PAIN RELIEF PATCH EX) Apply 2 patches topically daily.   Yes [provider]  ?loperamide (IMODIUM A-D) 2 MG tablet Take 1 tablet (2 mg total) by mouth 3 (three) times daily as needed for diarrhea or loose stools. 04/13/19  Yes Forrest Moron, MD  ?loratadine (CLARITIN) 10 MG tablet Take 10 mg by mouth daily.   Yes [provider]  ?MAGNESIUM GLYCINATE PO Take 450 mg by mouth.   Yes [provider]  ?niacinamide 500 MG tablet Take 500 mg by mouth 2 (two) times daily with a meal.   Yes [provider]  ?potassium chloride (KLOR-CON) 10 MEQ tablet Take 1 tablet by mouth on Monday, Wednesday, Friday and Saturday each week 10/16/21  Yes Nahser, Wonda Cheng, MD  ?promethazine  (PHENERGAN) 25 MG tablet Take 1 tablet (25 mg total) by mouth every 6 (six) hours as needed for nausea or vomiting. 01/11/21  Yes Wendie Agreste, MD  ?Simethicone (PHAZYME PO) Take 1 tablet by mouth every morni

## 2022-01-12 DIAGNOSIS — Z20822 Contact with and (suspected) exposure to covid-19: Secondary | ICD-10-CM | POA: Diagnosis not present

## 2022-01-15 ENCOUNTER — Other Ambulatory Visit: Payer: Self-pay | Admitting: Family Medicine

## 2022-01-16 DIAGNOSIS — Z20822 Contact with and (suspected) exposure to covid-19: Secondary | ICD-10-CM | POA: Diagnosis not present

## 2022-01-16 DIAGNOSIS — Z20828 Contact with and (suspected) exposure to other viral communicable diseases: Secondary | ICD-10-CM | POA: Diagnosis not present

## 2022-01-23 DIAGNOSIS — H31091 Other chorioretinal scars, right eye: Secondary | ICD-10-CM | POA: Diagnosis not present

## 2022-02-01 DIAGNOSIS — Z23 Encounter for immunization: Secondary | ICD-10-CM | POA: Diagnosis not present

## 2022-03-06 DIAGNOSIS — L570 Actinic keratosis: Secondary | ICD-10-CM | POA: Diagnosis not present

## 2022-03-06 DIAGNOSIS — Z86018 Personal history of other benign neoplasm: Secondary | ICD-10-CM | POA: Diagnosis not present

## 2022-03-06 DIAGNOSIS — D485 Neoplasm of uncertain behavior of skin: Secondary | ICD-10-CM | POA: Diagnosis not present

## 2022-03-06 DIAGNOSIS — L821 Other seborrheic keratosis: Secondary | ICD-10-CM | POA: Diagnosis not present

## 2022-03-06 DIAGNOSIS — L578 Other skin changes due to chronic exposure to nonionizing radiation: Secondary | ICD-10-CM | POA: Diagnosis not present

## 2022-03-06 DIAGNOSIS — Z85828 Personal history of other malignant neoplasm of skin: Secondary | ICD-10-CM | POA: Diagnosis not present

## 2022-03-06 DIAGNOSIS — D225 Melanocytic nevi of trunk: Secondary | ICD-10-CM | POA: Diagnosis not present

## 2022-03-06 DIAGNOSIS — L814 Other melanin hyperpigmentation: Secondary | ICD-10-CM | POA: Diagnosis not present

## 2022-03-06 DIAGNOSIS — C44722 Squamous cell carcinoma of skin of right lower limb, including hip: Secondary | ICD-10-CM | POA: Diagnosis not present

## 2022-03-16 DIAGNOSIS — M5416 Radiculopathy, lumbar region: Secondary | ICD-10-CM | POA: Insufficient documentation

## 2022-03-21 DIAGNOSIS — L570 Actinic keratosis: Secondary | ICD-10-CM | POA: Diagnosis not present

## 2022-04-10 DIAGNOSIS — K648 Other hemorrhoids: Secondary | ICD-10-CM | POA: Insufficient documentation

## 2022-04-18 DIAGNOSIS — C44722 Squamous cell carcinoma of skin of right lower limb, including hip: Secondary | ICD-10-CM | POA: Diagnosis not present

## 2022-04-19 DIAGNOSIS — M5416 Radiculopathy, lumbar region: Secondary | ICD-10-CM | POA: Diagnosis not present

## 2022-06-11 DIAGNOSIS — H5053 Vertical heterophoria: Secondary | ICD-10-CM | POA: Diagnosis not present

## 2022-06-11 DIAGNOSIS — H2513 Age-related nuclear cataract, bilateral: Secondary | ICD-10-CM | POA: Diagnosis not present

## 2022-06-11 DIAGNOSIS — H5051 Esophoria: Secondary | ICD-10-CM | POA: Diagnosis not present

## 2022-06-11 DIAGNOSIS — H269 Unspecified cataract: Secondary | ICD-10-CM | POA: Insufficient documentation

## 2022-06-22 DIAGNOSIS — M65341 Trigger finger, right ring finger: Secondary | ICD-10-CM | POA: Insufficient documentation

## 2022-06-22 DIAGNOSIS — M79644 Pain in right finger(s): Secondary | ICD-10-CM | POA: Insufficient documentation

## 2022-06-28 ENCOUNTER — Telehealth: Payer: Self-pay | Admitting: Lab

## 2022-06-28 NOTE — Telephone Encounter (Signed)
Placed paperwork from [facility] in Dr. Mancel Bale folder on 06/28/2022

## 2022-06-29 ENCOUNTER — Telehealth: Payer: Self-pay | Admitting: Lab

## 2022-06-29 ENCOUNTER — Other Ambulatory Visit: Payer: Self-pay | Admitting: Family Medicine

## 2022-06-29 DIAGNOSIS — Z1231 Encounter for screening mammogram for malignant neoplasm of breast: Secondary | ICD-10-CM

## 2022-06-29 NOTE — Telephone Encounter (Signed)
Faxed over [facility] prescription information to [phone] on 06/29/2022

## 2022-07-16 ENCOUNTER — Encounter: Payer: Medicare Other | Admitting: Family Medicine

## 2022-07-18 ENCOUNTER — Ambulatory Visit (INDEPENDENT_AMBULATORY_CARE_PROVIDER_SITE_OTHER): Payer: Medicare Other | Admitting: *Deleted

## 2022-07-18 DIAGNOSIS — Z0000 Encounter for general adult medical examination without abnormal findings: Secondary | ICD-10-CM | POA: Diagnosis not present

## 2022-07-18 NOTE — Patient Instructions (Signed)
Ms. [name] , Thank you for taking time to come for your Medicare Wellness Visit. I appreciate your ongoing commitment to your health goals. Please review the following plan we discussed and let me know if I can assist you in the future.   These are the goals we discussed:  Goals      Exercise 3x per week (30 min per time)     Would like to get back on golf course.  Patient states that she will try to start back exercising on a weekly basis more often.      Patient Stated     Continue current lifestyle Would like to be pain free          This is a list of the screening recommended for you and due dates:    Advanced directives: Education provided  Conditions/risks identified:   Next appointment: Follow up in one year for your annual wellness visit 07-26-2022 @ 1:40   [name]   Preventive Care 65 Years and Older, Female Preventive care refers to lifestyle choices and visits with your health care provider that can promote health and wellness. What does preventive care include? A yearly physical exam. This is also called an annual well check. Dental exams once or twice a year. Routine eye exams. Ask your health care provider how often you should have your eyes checked. Personal lifestyle choices, including: Daily care of your teeth and gums. Regular physical activity. Eating a healthy diet. Avoiding tobacco and drug use. Limiting alcohol use. Practicing safe sex. Taking low-dose aspirin every day. Taking vitamin and mineral supplements as recommended by your health care provider. What happens during an annual well check? The services and screenings done by your health care provider during your annual well check will depend on your age, overall health, lifestyle risk factors, and family history of disease. Counseling  Your health care provider may ask you questions about your: Alcohol use. Tobacco use. Drug use. Emotional well-being. Home and relationship  well-being. Sexual activity. Eating habits. History of falls. Memory and ability to understand (cognition). Work and work Statistician. Reproductive health. Screening  You may have the following tests or measurements: Height, weight, and BMI. Blood pressure. Lipid and cholesterol levels. These may be checked every 5 years, or more frequently if you are over 75 years old. Skin check. Lung cancer screening. You may have this screening every year starting at age 41 if you have a 30-pack-year history of smoking and currently smoke or have quit within the past 15 years. Fecal occult blood test (FOBT) of the stool. You may have this test every year starting at age 39. Flexible sigmoidoscopy or colonoscopy. You may have a sigmoidoscopy every 5 years or a colonoscopy every 10 years starting at age 60. Hepatitis C blood test. Hepatitis B blood test. Sexually transmitted disease (STD) testing. Diabetes screening. This is done by checking your blood sugar (glucose) after you have not eaten for a while (fasting). You may have this done every 1-3 years. Bone density scan. This is done to screen for osteoporosis. You may have this done starting at age 71. Mammogram. This may be done every 1-2 years. Talk to your health care provider about how often you should have regular mammograms. Talk with your health care provider about your test results, treatment options, and if necessary, the need for more tests. Vaccines  Your health care provider may recommend certain vaccines, such as: Influenza vaccine. This is recommended every year. Tetanus, diphtheria, and acellular  pertussis (Tdap, Td) vaccine. You may need a Td booster every 10 years. Zoster vaccine. You may need this after age 55. Pneumococcal 13-valent conjugate (PCV13) vaccine. One dose is recommended after age 60. Pneumococcal polysaccharide (PPSV23) vaccine. One dose is recommended after age 58. Talk to your health care provider about which  screenings and vaccines you need and how often you need them. This information is not intended to replace advice given to you by your health care provider. Make sure you discuss any questions you have with your health care provider. Document Released: 09/23/2015 Document Revised: 05/16/2016 Document Reviewed: 06/28/2015 Elsevier Interactive Patient Education  2017 [employer] Prevention in the Home Falls can cause injuries. They can happen to people of all ages. There are many things you can do to make your home safe and to help prevent falls. What can I do on the outside of my home? Regularly fix the edges of walkways and driveways and fix any cracks. Remove anything that might make you trip as you walk through a door, such as a raised step or threshold. Trim any bushes or trees on the path to your home. Use bright outdoor lighting. Clear any walking paths of anything that might make someone trip, such as rocks or tools. Regularly check to see if handrails are loose or broken. Make sure that both sides of any steps have handrails. Any raised decks and porches should have guardrails on the edges. Have any leaves, snow, or ice cleared regularly. Use sand or salt on walking paths during winter. Clean up any spills in your garage right away. This includes oil or grease spills. What can I do in the bathroom? Use night lights. Install grab bars by the toilet and in the tub and shower. Do not use towel bars as grab bars. Use non-skid mats or decals in the tub or shower. If you need to sit down in the shower, use a plastic, non-slip stool. Keep the floor dry. Clean up any water that spills on the floor as soon as it happens. Remove soap buildup in the tub or shower regularly. Attach bath mats securely with double-sided non-slip rug tape. Do not have throw rugs and other things on the floor that can make you trip. What can I do in the bedroom? Use night lights. Make sure that you have a  light by your bed that is easy to reach. Do not use any sheets or blankets that are too big for your bed. They should not hang down onto the floor. Have a firm chair that has side arms. You can use this for support while you get dressed. Do not have throw rugs and other things on the floor that can make you trip. What can I do in the kitchen? Clean up any spills right away. Avoid walking on wet floors. Keep items that you use a lot in easy-to-reach places. If you need to reach something above you, use a strong step stool that has a grab bar. Keep electrical cords out of the way. Do not use floor polish or wax that makes floors slippery. If you must use wax, use non-skid floor wax. Do not have throw rugs and other things on the floor that can make you trip. What can I do with my stairs? Do not leave any items on the stairs. Make sure that there are handrails on both sides of the stairs and use them. Fix handrails that are broken or loose. Make sure that handrails  are as long as the stairways. Check any carpeting to make sure that it is firmly attached to the stairs. Fix any carpet that is loose or worn. Avoid having throw rugs at the top or bottom of the stairs. If you do have throw rugs, attach them to the floor with carpet tape. Make sure that you have a light switch at the top of the stairs and the bottom of the stairs. If you do not have them, ask someone to add them for you. What else can I do to help prevent falls? Wear shoes that: Do not have high heels. Have rubber bottoms. Are comfortable and fit you well. Are closed at the toe. Do not wear sandals. If you use a stepladder: Make sure that it is fully opened. Do not climb a closed stepladder. Make sure that both sides of the stepladder are locked into place. Ask someone to hold it for you, if possible. Clearly mark and make sure that you can see: Any grab bars or handrails. First and last steps. Where the edge of each step  is. Use tools that help you move around (mobility aids) if they are needed. These include: Canes. Walkers. Scooters. Crutches. Turn on the lights when you go into a dark area. Replace any light bulbs as soon as they burn out. Set up your furniture so you have a clear path. Avoid moving your furniture around. If any of your floors are uneven, fix them. If there are any pets around you, be aware of where they are. Review your medicines with your doctor. Some medicines can make you feel dizzy. This can increase your chance of falling. Ask your doctor what other things that you can do to help prevent falls. This information is not intended to replace advice given to you by your health care provider. Make sure you discuss any questions you have with your health care provider. Document Released: 06/23/2009 Document Revised: 02/02/2016 Document Reviewed: 10/01/2014 Elsevier Interactive Patient Education  2017 [employer].

## 2022-07-18 NOTE — Progress Notes (Signed)
Subjective:   [name] is a 71 y.o. female who presents for Medicare Annual (Subsequent) preventive examination.  I connected with  [name] on 07/18/22 by a telephone enabled telemedicine application and verified that I am speaking with the correct person using two identifiers.   I discussed the limitations of evaluation and management by telemedicine. The patient expressed understanding and agreed to proceed.  Patient location: home  Provider location: in office-telephone    Review of Systems           Objective:    There were no vitals filed for this visit. There is no height or weight on file to calculate BMI.     07/14/2021    8:16 AM 10/19/2019    8:15 AM 03/19/2019    8:29 AM 09/24/2018    9:35 AM 01/31/2018   10:12 AM 09/23/2017    9:40 AM 06/17/2017    7:19 AM  Advanced Directives  Does Patient Have a Medical Advance Directive? Yes Yes No Yes Yes Yes Yes  Type of Advance Directive    Living will;Healthcare Power of Attorney;Out of facility DNR (pink MOST or yellow form) [place];Living will [place];Living will [place];Living will  Does patient want to make changes to medical advance directive?  Yes (ED - send information to MyChart)  No - Patient declined No - Patient declined  No - Patient declined  Copy of [place] in Chart?    No - copy requested No - copy requested No - copy requested No - copy requested  Would patient like information on creating a medical advance directive? No - Patient declined  No - Patient declined        Current Medications (verified) Outpatient Encounter Medications as of 07/18/2022  Medication Sig   acetaminophen (TYLENOL) 500 MG tablet Take 1,000 mg by mouth every 8 (eight) hours as needed for moderate pain or headache.    atorvastatin (LIPITOR) 20 MG tablet Take 1 tablet (20 mg total) by mouth every morning.   buPROPion  (WELLBUTRIN XL) 300 MG 24 hr tablet Take 1 tablet (300 mg total) by mouth daily.   Cyanocobalamin (VITAMIN B12) 1000 MCG TBCR Take 1,000 mcg by mouth daily.   diclofenac sodium (VOLTAREN) 1 % GEL Apply 1 g topically daily as needed.    fluorouracil (EFUDEX) 5 % cream Apply 1 application topically See admin instructions. In January applies daily for 3 weeks then off for 1-3 weeks then resume for 3 weeks. "For skin cancers"   fluticasone (FLONASE) 50 MCG/ACT nasal spray    furosemide (LASIX) 40 MG tablet TAKE 1 TABLET DAILY   HYDROcodone-acetaminophen (NORCO/VICODIN) 5-325 MG tablet Take 1 tablet by mouth every 6 (six) hours as needed.   Liniments (SALONPAS PAIN RELIEF PATCH EX) Apply 2 patches topically daily.   loperamide (IMODIUM A-D) 2 MG tablet Take 1 tablet (2 mg total) by mouth 3 (three) times daily as needed for diarrhea or loose stools.   loratadine (CLARITIN) 10 MG tablet Take 10 mg by mouth daily.   MAGNESIUM GLYCINATE PO Take 450 mg by mouth.   niacinamide 500 MG tablet Take 500 mg by mouth 2 (two) times daily with a meal.   potassium chloride (KLOR-CON) 10 MEQ tablet Take 1 tablet by mouth on Monday, Wednesday, Friday and Saturday each week   promethazine (PHENERGAN) 25 MG tablet Take 1 tablet (25 mg total) by mouth every 6 (six) hours  as needed for nausea or vomiting.   Simethicone (PHAZYME PO) Take 1 tablet by mouth every morning.    No facility-administered encounter medications on file as of 07/18/2022.    Allergies (verified) Nsaids and Other   History: Past Medical History:  Diagnosis Date   Arthritis    knees, hands   Bursitis of left hip    Cancer ([place])    hx skin cancer   Cataract    Phreesia 09/11/2020   Depression    Depression    Phreesia 09/11/2020   GERD (gastroesophageal reflux disease)    INFREQUENT   Headache    HX MIGRAINES    Heart murmur     MVP 30 yrs ago - no treatment needed   Hyperlipidemia    on statin   Hypertension    "flucuations" has  never been on any med   Insomnia    Irritable bowel syndrome    s/p subtotal colectomy '86>diarrhea prone   NEOPLASM, MALIGNANT, BASAL CELL, CARCINOMA, HX OF    Seasonal allergies    Past Surgical History:  Procedure Laterality Date   ABDOMINOPLASTY     ANTERIOR CERVICAL DECOMP/DISCECTOMY FUSION N/A 02/14/2018   Procedure: CERVICAL THREE-FOUR,CERVICAL FOUR-FIVE ACDF, REMOVAL CERVICAL FIVE-SEVEN  PLATE.;  Surgeon: Eustace Moore, MD;  Location: [facility];  Service: Neurosurgery;  Laterality: N/A;  anterior   BACK SURGERY  1999   lower back   BREAST ENHANCEMENT SURGERY  1983   BREAST IMPLANT REMOVAL  08/2010   BREAST SURGERY     Augmentation in 1983, Reduction 2011   BUNIONECTOMY     right  and left foot   CERVICAL SPINE SURGERY  12/18/1999   C5/6 and C6/7   CHOLECYSTECTOMY N/A    Phreesia 09/11/2020   COLECTOMY  1986   COLON SURGERY N/A    Phreesia 09/11/2020   COSMETIC SURGERY     EXCISION/RELEASE BURSA HIP Left 07/04/2015   Procedure: LEFT HIP BURSECTOMY WITH ;  Surgeon: Gaynelle Arabian, MD;  Location: [facility];  Service: Orthopedics;  Laterality: Left;   EYE SURGERY     Lasik 2010   eyelid surgery     FRACTURE SURGERY     HYSTEROSCOPY WITH D & C  08/17/2011   Procedure: DILATATION AND CURETTAGE (D&C) /HYSTEROSCOPY;  Surgeon: Margarette Asal;  Location: [facility];  Service: Gynecology;  Laterality: N/A;   INJECTION KNEE Right 07/04/2015   Procedure: CORTISONE INJECTION RIGHT KNEE ;  Surgeon: Gaynelle Arabian, MD;  Location: [facility];  Service: Orthopedics;  Laterality: Right;   JOINT REPLACEMENT N/A    Phreesia 09/11/2020   KNEE ARTHROSCOPY     right x 2   KNEE RECONSTRUCTION, MEDIAL PATELLAR FEMORAL LIGAMENT     right   LASIK  2009    bilateral   left foot surgery     removed foreign object   OPEN SURGICAL REPAIR OF GLUTEAL TENDON Left 07/04/2015   Procedure: GLUTEAL TENDON REPAIR ;  Surgeon: Gaynelle Arabian, MD;  Location: [facility];  Service: Orthopedics;  Laterality: Left;    REDUCTION MAMMAPLASTY Bilateral    SPINE SURGERY N/A    Phreesia 09/11/2020   thumb surgery     TOTAL KNEE ARTHROPLASTY Right 06/17/2017   Procedure: RIGHT TOTAL KNEE ARTHROPLASTY;  Surgeon: Gaynelle Arabian, MD;  Location: [facility];  Service: Orthopedics;  Laterality: Right;   TRIGGER FINGER RELEASE Right 09/11/2021   TUBAL LIGATION     Family History  Problem Relation Age of Onset  Hypertension Mother    Heart disease Mother    Stomach cancer Father 33   Cancer Father        stomach and liver cancer   Diabetes Brother    Heart disease Brother    Hypertension Brother    Cancer Daughter        breast cancer   Hypertension Daughter    Hypertension Brother    Heart disease Brother    Thyroid disease Sister    Breast cancer Sister    Hypertension Sister    Psoriasis Sister    High Cholesterol Sister    Neuropathy Sister    Colon cancer Neg Hx    Social History   Socioeconomic History   Marital status: Married    Spouse name: Not on file   Number of children: 2   Years of education: Not on file   Highest education level: Some college, no degree  Occupational History   Not on file  Tobacco Use   Smoking status: Never   Smokeless tobacco: Never   Tobacco comments:    Married, 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  Retired.  Education some college.  2 children.  Caffeine 3-4 glasses of tea/ day.   Social Determinants of Health   Financial Resource Strain: Low Risk  (09/23/2017)   Overall Financial Resource Strain (CARDIA)    Difficulty of Paying Living Expenses: Not hard at all  Food Insecurity: No Food  Insecurity (09/23/2017)   Hunger Vital Sign    Worried About Running Out of Food in the Last Year: Never true    Ran Out of Food in the Last Year: Never true  Transportation Needs: No Transportation Needs (09/23/2017)   PRAPARE - Hydrologist (Medical): No    Lack of Transportation (Non-Medical): No  Physical Activity: Inactive (09/23/2017)   Exercise Vital Sign    Days of Exercise per Week: 0 days    Minutes of Exercise per Session: 0 min  Stress: Stress Concern Present (09/23/2017)   [place]    Feeling of Stress : Rather much  Social Connections: Somewhat Isolated (09/23/2017)   Social Connection and Isolation Panel [NHANES]    Frequency of Communication with Friends and Family: More than three times a week    Frequency of Social Gatherings with Friends and Family: More than three times a week    Attends Religious Services: Never    Marine scientist or Organizations: No    Attends Archivist Meetings: Never    Marital Status: Married    Tobacco Counseling Counseling given: Not Answered Tobacco comments: Married, 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  no         Activities of Daily Living    07/17/2022    4:52 PM  In your present state of health, do you have any difficulty performing the following activities:  Hearing? 0  Vision? 1  Difficulty concentrating or making decisions? 1  Walking or climbing stairs? 0  Dressing or bathing? 0  Doing errands, shopping? 0  Preparing Food and eating ? N  Using the Toilet? Y  In the past six months, have you accidently leaked urine? N  Do you have problems with  loss of bowel control? Y  Managing your Medications? N  Managing your Finances? N  Housekeeping or managing your Housekeeping? N    Patient Care Team: Wendie Agreste, MD as PCP - General (Family Medicine) Nahser, Wonda Cheng, MD as PCP - Cardiology (Cardiology) Molli Posey, MD (Obstetrics and Gynecology) [facility], Jennefer Bravo, MD (Dermatology) Gaynelle Arabian, MD (Orthopedic Surgery) [place], [place], [place] (Optometry)  Indicate any recent Medical Services you may have received from other than [facility] providers in the past year (date may be approximate).     Assessment:   This is a routine wellness examination for [place].  Hearing/Vision screen No results found.  Dietary issues and exercise activities discussed:     Goals Addressed   None    Depression Screen    01/11/2022   10:57 AM 07/14/2021    8:13 AM 05/01/2021    2:33 PM 01/11/2021    2:20 PM 05/27/2020   11:05 AM 10/19/2019    8:12 AM 04/13/2019    9:23 AM  PHQ 2/9 Scores  PHQ - 2 Score 0 0 0 0 0 0 0  PHQ- 9 Score 3 6         Fall Risk    07/17/2022    4:52 PM 01/11/2022   10:57 AM 07/14/2021    8:13 AM 05/01/2021    2:33 PM 01/11/2021    2:20 PM  [place] in the past year? 0 0 0 0 0  Number falls in past yr: 0 0 0 0   Injury with Fall?  0 0 0   Risk for fall due to :  No Fall Risks No Fall Risks No Fall Risks   Follow up  Falls evaluation completed Falls evaluation completed Falls evaluation completed     [place]:  Any stairs in or around the home? No  If so, are there any without handrails? No  Home free of loose throw rugs in walkways, pet beds, electrical cords, etc? Yes  Adequate lighting in your home to reduce risk of falls? Yes   ASSISTIVE DEVICES UTILIZED TO PREVENT FALLS:  Life alert? No  Use of a cane, walker or w/c? No  Grab bars in the bathroom? Yes  Shower chair or bench in shower? No  Elevated toilet seat or a handicapped toilet? No   TIMED UP AND  GO:  Was the test performed? No .    Cognitive Function:        07/14/2021    8:13 AM 10/19/2019    8:12 AM 09/24/2018    9:23 AM 09/23/2017    9:48 AM  6CIT Screen  What Year? 0 points 0 points 0 points 0 points  What month? 0 points 0 points 0 points 0 points  What time? 0 points 0 points 0 points 0 points  Count back from 20 0 points 0 points 0  points 0 points  Months in reverse 0 points  0 points 0 points  Repeat phrase 0 points  0 points 0 points  Total Score 0 points  0 points 0 points    Immunizations Immunization History  Administered Date(s) Administered   Influenza Split 05/29/2012   Influenza Whole 06/07/2009, 07/06/2010   Influenza, High Dose Seasonal PF 10/09/2017   Influenza,inj,Quad PF,6+ Mos 08/03/2013, 06/18/2014, 06/09/2015   Influenza,inj,quad, With Preservative 08/21/2019   Influenza-Unspecified 06/10/2016, 08/21/2019   PFIZER(Purple Top)SARS-COV-2 Vaccination 10/16/2019, 11/10/2019, 06/18/2020, 12/07/2020, 06/04/2021   Pneumococcal Conjugate-13 09/14/2015   Pneumococcal Polysaccharide-23 09/19/2016   Respiratory Syncytial Virus Vaccine,Recomb Aduvanted(Arexvy) 07/02/2022   Tdap 05/29/2012, 04/15/2017   Zoster Recombinat (Shingrix) 08/21/2019, 03/14/2020   Zoster, Live 08/03/2013, 08/21/2019    TDAP status: Up to date  Flu Vaccine status: Due, Education has been provided regarding the importance of this vaccine. Advised may receive this vaccine at local pharmacy or Health Dept. Aware to provide a copy of the vaccination record if obtained from local pharmacy or Health Dept. Verbalized acceptance and understanding.  Pneumococcal vaccine status: Up to date  Covid-19 vaccine status: Information provided on how to obtain vaccines.   Qualifies for Shingles Vaccine? No   Zostavax completed Yes   Shingrix Completed?: Yes  Screening Tests Health Maintenance  Topic Date Due   COVID-19 Vaccine (6 - Pfizer risk series) 07/30/2021   INFLUENZA VACCINE   04/10/2022   Medicare Annual Wellness (AWV)  07/14/2022   COLONOSCOPY (Pts 45-53yr Insurance coverage will need to be confirmed)  08/01/2022   MAMMOGRAM  05/30/2023   TETANUS/TDAP  04/16/2027   Pneumonia Vaccine 71 Years old  Completed   DEXA SCAN  Completed   Hepatitis C Screening  Completed   Zoster Vaccines- Shingrix  Completed   HPV VACCINES  Aged Out    Health Maintenance  Health Maintenance Due  Topic Date Due   COVID-19 Vaccine (6 - Pfizer risk series) 07/30/2021   INFLUENZA VACCINE  04/10/2022   Medicare Annual Wellness (AWV)  07/14/2022    Colonoscopy 2013 patient has had a large portion of intestine removed was told did not need another  Mammogram Scheduled  Bone Density status: Completed 2022. Results reflect: Bone density results: OSTEOPENIA. Repeat every 2 years.  Lung Cancer Screening: (Low Dose CT Chest recommended if Age 137-80years, 30 pack-year currently smoking OR have quit w/in 15years.) does not qualify.   Lung Cancer Screening Referral:   Additional Screening:  Hepatitis C Screening: does not qualify; Completed 2018  Vision Screening: Recommended annual ophthalmology exams for early detection of glaucoma and other disorders of the eye. Is the patient up to date with their annual eye exam?  Yes  Who is the provider or what is the name of the office in which the patient attends annual eye exams?    If pt is not established with a provider, would they like to be referred to a provider to establish care? No .   Dental Screening: Recommended annual dental exams for proper oral hygiene  Community Resource Referral / Chronic Care Management: CRR required this visit?  No   CCM required this visit?  No      Plan:     I have personally reviewed and noted the following in the patient's chart:   Medical and social history Use of alcohol, tobacco or illicit drugs  Current medications and supplements including opioid prescriptions. Patient is not  currently taking opioid prescriptions. Functional ability and status Nutritional status  Physical activity Advanced directives List of other physicians Hospitalizations, surgeries, and ER visits in previous 12 months Vitals Screenings to include cognitive, depression, and falls Referrals and appointments  In addition, I have reviewed and discussed with patient certain preventive protocols, quality metrics, and best practice recommendations. A written personalized care plan for preventive services as well as general preventive health recommendations were provided to patient.     Leroy Kennedy, LPN   [id number]   Nurse Notes:   patient has an appointment on 07-26-2022 she states she feels like she is having some issue with her rectum and will be requesting a referral to GI specialist

## 2022-07-24 DIAGNOSIS — Z23 Encounter for immunization: Secondary | ICD-10-CM | POA: Diagnosis not present

## 2022-07-26 ENCOUNTER — Encounter: Payer: Self-pay | Admitting: Family Medicine

## 2022-07-26 ENCOUNTER — Ambulatory Visit (INDEPENDENT_AMBULATORY_CARE_PROVIDER_SITE_OTHER): Payer: Medicare Other | Admitting: Family Medicine

## 2022-07-26 VITALS — BP 124/72 | HR 75 | Temp 97.9°F | Ht 66.0 in | Wt 158.2 lb

## 2022-07-26 DIAGNOSIS — R413 Other amnesia: Secondary | ICD-10-CM | POA: Diagnosis not present

## 2022-07-26 DIAGNOSIS — K529 Noninfective gastroenteritis and colitis, unspecified: Secondary | ICD-10-CM

## 2022-07-26 DIAGNOSIS — E782 Mixed hyperlipidemia: Secondary | ICD-10-CM

## 2022-07-26 DIAGNOSIS — Z760 Encounter for issue of repeat prescription: Secondary | ICD-10-CM | POA: Diagnosis not present

## 2022-07-26 DIAGNOSIS — K623 Rectal prolapse: Secondary | ICD-10-CM

## 2022-07-26 DIAGNOSIS — F339 Major depressive disorder, recurrent, unspecified: Secondary | ICD-10-CM | POA: Diagnosis not present

## 2022-07-26 MED ORDER — ATORVASTATIN CALCIUM 20 MG PO TABS: 20.0000 mg | ORAL_TABLET | Freq: Every morning | ORAL | 3 refills | Status: DC

## 2022-07-26 MED ORDER — FUROSEMIDE 40 MG PO TABS: ORAL_TABLET | ORAL | 3 refills | Status: DC

## 2022-07-26 MED ORDER — BUPROPION HCL ER (XL) 300 MG PO TB24: 300.0000 mg | ORAL_TABLET | Freq: Every day | ORAL | 3 refills | Status: DC

## 2022-07-26 NOTE — Progress Notes (Signed)
Subjective:  Patient ID: [name], female    DOB: 1950-12-28  Age: 71 y.o. MRN: [id number]  CC:  Chief Complaint  Patient presents with   Annual Exam    Pt states she she had surgery for double vision and they punctured her eyeball after everything she now has to have cataract surgery     Rectum    Pt states she thinks her rectum is coming out and she is having accidents and don't know when its happening    Insomnia    Pt states she is having trouble sleeping, pt states she can't sleep more than 6 hours     Memory Loss    Pt has a panic attack because she can't remember    Depression    PHQ9 - 5    HPI [name] presents for   Concerns above.  Had AWV with Almyra Free on 11/8.  Health maintenance up-to-date.  Hypertension: Cardiology - Dr. Acie Fredrickson - t, th, sat sun lasix with potassium. Chronic diastolic CHF, hypertensive heart dz.  Home readings: BP Readings from Last 3 Encounters:  07/26/22 124/72  01/11/22 132/76  10/12/21 136/70   Lab Results  Component Value Date   CREATININE 0.79 01/11/2022      ? Rectal prolapse.  Feels like rectum is coming out at times. Past few months. Notices at various times, not just with BM. Had intestinal surgery in 1986, most of large intestine removed except 6 inches. No pain.  ? Some with BM, other times with walking feels like it comes out. Feels tissue come out with BM. Chronic diarrhea. No bleeding.  Surgery in [place]. Has not seen GI locally recently. Colonoscopy 2013. No need to repeat d/t partial colectomy. Dr. Sharlett Iles.  Prior GYN - Dr. Matthew Saras. No visit since age 21.   Followed by optho for eye issues above- planned cataract surgery soon - with  Dr. Prudencio Burly.   Hyperlipidemia: Lipitor '20mg'$  qd. No new side effects.  Lab Results  Component Value Date   CHOL 153 01/11/2022   HDL 59.30 01/11/2022   LDLCALC 72 01/11/2022   TRIG 108.0 01/11/2022   CHOLHDL 3 01/11/2022   Lab Results  Component Value  Date   ALT 14 01/11/2022   AST 20 01/11/2022   ALKPHOS 62 01/11/2022   BILITOT 0.7 01/11/2022   Depression: Treated with wellbutrin '300mg'$  qd. Stable for mood symptoms prior.  Trouble remembering things. 2 episodes in past week with not knowing how to get somewhere, and makes her anxious. Realizes direction few minutes later.   Denies new stressors, not feeling anxious or depressed. No new meds.  Discussed concerns with husband - he has noted some difficulty with remembering some conversations. Had some difficulty in past, discussed last year. Concerned about new difficulty with driving concerns.  No MVC, no moving violations. Normal 6 cit at [facility].  Busy few weeks. [place], dog show in [place], food poisoning, then another trip to [facility].      07/18/2022   10:18 AM 07/14/2021    8:13 AM 10/19/2019    8:12 AM 09/24/2018    9:23 AM 09/23/2017    9:48 AM  6CIT Screen  What Year? 0 points 0 points 0 points 0 points 0 points  What month? 0 points 0 points 0 points 0 points 0 points  What time? 0 points 0 points 0 points 0 points 0 points  Count back from 20 0 points 0 points 0 points 0 points 0 points  Months in reverse 0 points 0 points  0 points 0 points  Repeat phrase 0 points 0 points  0 points 0 points  Total Score 0 points 0 points  0 points 0 points         07/26/2022    1:57 PM 07/18/2022   10:16 AM 01/11/2022   10:57 AM 07/14/2021    8:13 AM 05/01/2021    2:33 PM  Depression screen PHQ 2/9  Decreased Interest 0 0 0 0 0  Down, Depressed, Hopeless 0 0 0 0 0  PHQ - 2 Score 0 0 0 0 0  Altered sleeping '3 3 3 3   '$ Tired, decreased energy 1 1 0 0   Change in appetite 0 1 0 0   Feeling bad or failure about yourself  0 0 0 0   Trouble concentrating 1 0 0 3   Moving slowly or fidgety/restless 0 0 0 0   Suicidal thoughts 0 0 0 0   PHQ-9 Score '5 5 3 6   '$ Difficult doing work/chores  Not difficult at all            History Patient Active Problem List   Diagnosis Date Noted   chronic  diastolic chf 33/43/5686   Cervical vertebral fusion 02/14/2018   OA (osteoarthritis) of knee 06/17/2017   Trochanteric bursitis of left hip 07/03/2015   Routine general medical examination at a health care facility 11/18/2014   CARCINOMA, SKIN, SQUAMOUS CELL 07/06/2010   ACTINIC KERATOSIS 07/06/2010   NEOPLASM, MALIGNANT, BASAL CELL, CARCINOMA, HX OF 07/06/2010   Hyperlipidemia 06/08/2009   DEPRESSION 06/08/2009   POSTMENOPAUSAL STATUS 06/08/2009   IRRITABLE BOWEL SYNDROME 02/17/2008   BACK PAIN, CHRONIC 02/17/2008   MIGRAINE HEADACHE 02/05/2007   Essential hypertension 02/05/2007   Past Medical History:  Diagnosis Date   Arthritis    knees, hands   Bursitis of left hip    Cancer ([place])    hx skin cancer   Cataract    [facility]reesia 09/11/2020   Depression    Depression    [facility]reesia 09/11/2020   GERD (gastroesophageal reflux disease)    INFREQUENT   Headache    HX MIGRAINES    Heart murmur     MVP 30 yrs ago - no treatment needed   Hyperlipidemia    on statin   Hypertension    "flucuations" has never been on any med   Insomnia    Irritable bowel syndrome    s/p subtotal colectomy '86>diarrhea prone   NEOPLASM, MALIGNANT, BASAL CELL, CARCINOMA, HX OF    Seasonal allergies    Past Surgical History:  Procedure Laterality Date   ABDOMINOPLASTY     ANTERIOR CERVICAL DECOMP/DISCECTOMY FUSION N/A 02/14/2018   Procedure: CERVICAL THREE-FOUR,CERVICAL FOUR-FIVE ACDF, REMOVAL CERVICAL FIVE-SEVEN  PLATE.;  Surgeon: Eustace Moore, MD;  Location: [facility];  Service: Neurosurgery;  Laterality: N/A;  anterior   BACK SURGERY  1999   lower back   BREAST ENHANCEMENT SURGERY  1983   BREAST IMPLANT REMOVAL  08/2010   BREAST SURGERY     Augmentation in 1983, Reduction 2011   BUNIONECTOMY     right  and left foot   CERVICAL SPINE SURGERY  12/18/1999   C5/6 and C6/7   CHOLECYSTECTOMY N/A    [facility]reesia 09/11/2020   COLECTOMY  1986   COLON SURGERY N/A    [facility]reesia 09/11/2020   COSMETIC  SURGERY     EXCISION/RELEASE BURSA HIP Left 07/04/2015   Procedure: LEFT  HIP BURSECTOMY WITH ;  Surgeon: Gaynelle Arabian, MD;  Location: [facility];  Service: Orthopedics;  Laterality: Left;   EYE SURGERY     Lasik 2010   eyelid surgery     FRACTURE SURGERY     HYSTEROSCOPY WITH D & C  08/17/2011   Procedure: DILATATION AND CURETTAGE (D&C) /HYSTEROSCOPY;  Surgeon: Margarette Asal;  Location: [facility];  Service: Gynecology;  Laterality: N/A;   INJECTION KNEE Right 07/04/2015   Procedure: CORTISONE INJECTION RIGHT KNEE ;  Surgeon: Gaynelle Arabian, MD;  Location: [facility];  Service: Orthopedics;  Laterality: Right;   JOINT REPLACEMENT N/A    Phreesia 09/11/2020   KNEE ARTHROSCOPY     right x 2   KNEE RECONSTRUCTION, MEDIAL PATELLAR FEMORAL LIGAMENT     right   LASIK  2009    bilateral   left foot surgery     removed foreign object   OPEN SURGICAL REPAIR OF GLUTEAL TENDON Left 07/04/2015   Procedure: GLUTEAL TENDON REPAIR ;  Surgeon: Gaynelle Arabian, MD;  Location: [facility];  Service: Orthopedics;  Laterality: Left;   REDUCTION MAMMAPLASTY Bilateral    SPINE SURGERY N/A    Phreesia 09/11/2020   thumb surgery     TOTAL KNEE ARTHROPLASTY Right 06/17/2017   Procedure: RIGHT TOTAL KNEE ARTHROPLASTY;  Surgeon: Gaynelle Arabian, MD;  Location: [facility];  Service: Orthopedics;  Laterality: Right;   TRIGGER FINGER RELEASE Right 09/11/2021   TUBAL LIGATION     Allergies  Allergen Reactions   Nsaids Other (See Comments)    Hurts stomach   Other Rash    Retinol cream   Prior to Admission medications   Medication Sig Start Date End Date Taking? Authorizing Provider  acetaminophen (TYLENOL) 500 MG tablet Take 1,000 mg by mouth every 8 (eight) hours as needed for moderate pain or headache.    Yes [provider]  atorvastatin (LIPITOR) 20 MG tablet Take 1 tablet (20 mg total) by mouth every morning. 01/11/22  Yes Wendie Agreste, MD  buPROPion (WELLBUTRIN XL) 300 MG 24 hr tablet Take 1 tablet (300 mg  total) by mouth daily. 01/11/22  Yes Wendie Agreste, MD  Cyanocobalamin (VITAMIN B12) 1000 MCG TBCR Take 1,000 mcg by mouth daily.   Yes [provider]  diclofenac sodium (VOLTAREN) 1 % GEL Apply 1 g topically daily as needed.    Yes [provider]  fluticasone Asencion Islam) 50 MCG/ACT nasal spray  07/10/21  Yes [provider]  furosemide (LASIX) 40 MG tablet TAKE 1 TABLET DAILY 01/15/22  Yes Wendie Agreste, MD  HYDROcodone-acetaminophen (NORCO/VICODIN) 5-325 MG tablet Take 1 tablet by mouth every 6 (six) hours as needed. 11/08/20  Yes [provider]  Liniments (SALONPAS PAIN RELIEF PATCH EX) Apply 2 patches topically daily.   Yes [provider]  loperamide (IMODIUM A-D) 2 MG tablet Take 1 tablet (2 mg total) by mouth 3 (three) times daily as needed for diarrhea or loose stools. 04/13/19  Yes Stallings, Zoe A, MD  loratadine (CLARITIN) 10 MG tablet Take 10 mg by mouth daily.   Yes [provider]  MAGNESIUM GLYCINATE PO Take 450 mg by mouth.   Yes [provider]  niacinamide 500 MG tablet Take 500 mg by mouth 2 (two) times daily with a meal.   Yes [provider]  potassium chloride (KLOR-CON) 10 MEQ tablet Take 1 tablet by mouth on Monday, Wednesday, Friday and Saturday each week 10/16/21  Yes Nahser, Arnette Norris  J, MD  promethazine (PHENERGAN) 25 MG tablet Take 1 tablet (25 mg total) by mouth every 6 (six) hours as needed for nausea or vomiting. 01/11/22  Yes Wendie Agreste, MD  Simethicone (PHAZYME PO) Take 1 tablet by mouth every morning.    Yes [provider]  fluorouracil (EFUDEX) 5 % cream Apply 1 application topically See admin instructions. In January applies daily for 3 weeks then off for 1-3 weeks then resume for 3 weeks. 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  Retired.  Education some college.  2 children.  Caffeine 3-4 glasses of tea/ day.   Social Determinants of Health   Financial Resource Strain: Low Risk  (07/18/2022)   Overall Financial Resource Strain (CARDIA)    Difficulty of Paying Living Expenses: Not hard at all  Food Insecurity: No Food Insecurity (07/18/2022)   Hunger Vital Sign    Worried About Running Out of Food in the Last Year: Never true    Ran Out of Food in the Last Year: Never true  Transportation Needs: No Transportation Needs (07/18/2022)   PRAPARE - Hydrologist (Medical): No    Lack of Transportation (Non-Medical): No  Physical Activity: Sufficiently Active (07/18/2022)   Exercise Vital Sign    Days of Exercise per Week: 3 days    Minutes of Exercise per Session: 60 min  Stress: No Stress Concern Present (07/18/2022)   [place]    Feeling of Stress : Not at all  Social Connections: Moderately Integrated (07/18/2022)   Social Connection and Isolation Panel [NHANES]    Frequency of Communication with Friends and Family: Three times a week    Frequency of Social Gatherings with Friends and Family: Three times a week    Attends Religious Services: Never    Active Member of Clubs or  Organizations: Yes    Attends Archivist Meetings: 1 to 4 times per year    Marital Status: Married  Recent Concern: Social Connections - Moderately Isolated (07/18/2022)   Social Connection and Isolation Panel [NHANES]    Frequency of Communication with Friends and Family: More than three times a week    Frequency of Social Gatherings with Friends and Family: Three times a week    Attends Religious Services: Never    Active Member of Clubs or Organizations: No    Attends Archivist Meetings: Never    Marital Status: Married  Human resources officer Violence: Not At Risk (07/18/2022)  Humiliation, Afraid, Rape, and Kick questionnaire    Fear of Current or Ex-Partner: No    Emotionally Abused: No    Physically Abused: No    Sexually Abused: No    Review of Systems   Objective:   Vitals:   07/26/22 1359  BP: 124/72  Pulse: 75  Temp: 97.9 F (36.6 C)  SpO2: 96%  Weight: 158 lb 3.2 oz (71.8 kg)  Height: '5\' 6"'$  (1.676 m)     Physical Exam Vitals reviewed. Exam conducted with a chaperone present Mallie Darting).  Constitutional:      Appearance: Normal appearance. She is well-developed.  HENT:     Head: Normocephalic and atraumatic.  Eyes:     Conjunctiva/sclera: Conjunctivae normal.     Pupils: Pupils are equal, round, and reactive to light.  Neck:     Vascular: No carotid bruit.  Cardiovascular:     Rate and Rhythm: Normal rate and regular rhythm.     Heart sounds: Normal heart sounds.  Pulmonary:     Effort: Pulmonary effort is normal.     Breath sounds: Normal breath sounds.  Abdominal:     Palpations: Abdomen is soft. There is no pulsatile mass.     Tenderness: There is no abdominal tenderness.  Genitourinary:    Exam position: Lithotomy position.     Comments: No apparent external hemorrhoids, fissures, or prolapsed tissue at rest.  Valsalva without visible rectal prolapse.  She notes that she was unable to have similar symptoms in office that she  experiences at home or with prior bowel movements. Musculoskeletal:     Right lower leg: No edema.     Left lower leg: No edema.  Skin:    General: Skin is warm and dry.  Neurological:     Mental Status: She is alert and oriented to person, place, and time.  Psychiatric:        Mood and Affect: Mood normal.        Behavior: Behavior normal.        Assessment & Plan:  [name] is a 71 y.o. female . Mixed hyperlipidemia - Plan: atorvastatin (LIPITOR) 20 MG tablet, Comprehensive metabolic panel, Lipid panel  -Check updated labs, tolerating current regimen, continue Lipitor 20 mg daily.  Chronic diarrhea - Plan: Ambulatory referral to Gastroenterology  -History of chronic diarrhea, no bleeding.  Refer to gastroenterology for the recurrent diarrhea, history of partial colectomy.  General surgery for eval of possible rectal prolapse.  Memory changes - Plan: Ambulatory referral to Neurology Depression, recurrent ([place])  -Denies change in anxiety/depression, episodic memory difficulty as above.  Recent 6 CIT reassuring, referred to neurology to discuss other testing/work-up.  No med changes for now.  Continue Wellbutrin same dose.  Rectal prolapse - Plan: Ambulatory referral to Colorectal Surgery  -Although prolapse not seen on exam at present, her history is concerning for rectal prolapse.  Will refer to colorectal surgeon for further evaluation.  Meds ordered this encounter  Medications   atorvastatin (LIPITOR) 20 MG tablet    Sig: Take 1 tablet (20 mg total) by mouth every morning.    Dispense:  90 tablet    Refill:  3   buPROPion (WELLBUTRIN XL) 300 MG 24 hr tablet    Sig: Take 1 tablet (300 mg total) by mouth daily.    Dispense:  90 tablet    Refill:  3   furosemide (LASIX) 40 MG tablet    Sig: TAKE 1 TABLET DAILY or as  advised by cardioloy    Dispense:  90 tablet    Refill:  3   Patient Instructions  Based on your symptoms I am suspicious of rectal prolapse  and will refer you to colorectal surgery for further evaluation.  I am also referring you to gastroenterology for the chronic diarrhea.  No med changes for now.  Memory testing recently was reassuring but I will refer you to neurology to decide on further memory testing or possible neuropsych testing to determine if there is another cause for some of your memory concerns.  No med changes for now.  Thanks for coming in today.     Signed,   Merri Ray, MD [facility] 07/26/22 2:20 PM

## 2022-07-26 NOTE — Patient Instructions (Signed)
Based on your symptoms I am suspicious of rectal prolapse and will refer you to colorectal surgery for further evaluation.  I am also referring you to gastroenterology for the chronic diarrhea.  No med changes for now.  Memory testing recently was reassuring but I will refer you to neurology to decide on further memory testing or possible neuropsych testing to determine if there is another cause for some of your memory concerns.  No med changes for now.  Thanks for coming in today.

## 2022-07-27 LAB — COMPREHENSIVE METABOLIC PANEL
ALT: 17 U/L (ref 0–35)
AST: 18 U/L (ref 0–37)
Albumin: 4.9 g/dL (ref 3.5–5.2)
Alkaline Phosphatase: 68 U/L (ref 39–117)
BUN: 15 mg/dL (ref 6–23)
CO2: 29 mEq/L (ref 19–32)
Calcium: 9.9 mg/dL (ref 8.4–10.5)
Chloride: 103 mEq/L (ref 96–112)
Creatinine, Ser: 0.9 mg/dL (ref 0.40–1.20)
GFR: 64.03 mL/min (ref >60.00)
Glucose, Bld: 89 mg/dL (ref 70–99)
Potassium: 4.8 mEq/L (ref 3.5–5.1)
Sodium: 140 mEq/L (ref 135–145)
Total Bilirubin: 0.5 mg/dL (ref 0.2–1.2)
Total Protein: 7.6 g/dL (ref 6.0–8.3)

## 2022-07-27 LAB — LIPID PANEL
Cholesterol: 152 mg/dL (ref 0–200)
HDL: 58.7 mg/dL (ref >39.00)
LDL Cholesterol: 65 mg/dL (ref 0–99)
NonHDL: 92.88
Total CHOL/HDL Ratio: 3
Triglycerides: 137 mg/dL (ref 0.0–149.0)
VLDL: 27.4 mg/dL (ref 0.0–40.0)

## 2022-07-30 ENCOUNTER — Encounter: Payer: Self-pay | Admitting: Family Medicine

## 2022-08-07 ENCOUNTER — Encounter: Payer: Self-pay | Admitting: Gastroenterology

## 2022-08-07 DIAGNOSIS — Z23 Encounter for immunization: Secondary | ICD-10-CM | POA: Diagnosis not present

## 2022-08-10 DIAGNOSIS — H2513 Age-related nuclear cataract, bilateral: Secondary | ICD-10-CM | POA: Diagnosis not present

## 2022-08-14 DIAGNOSIS — L578 Other skin changes due to chronic exposure to nonionizing radiation: Secondary | ICD-10-CM | POA: Diagnosis not present

## 2022-08-14 DIAGNOSIS — D225 Melanocytic nevi of trunk: Secondary | ICD-10-CM | POA: Diagnosis not present

## 2022-08-14 DIAGNOSIS — C44719 Basal cell carcinoma of skin of left lower limb, including hip: Secondary | ICD-10-CM | POA: Diagnosis not present

## 2022-08-14 DIAGNOSIS — Z86018 Personal history of other benign neoplasm: Secondary | ICD-10-CM | POA: Diagnosis not present

## 2022-08-14 DIAGNOSIS — L821 Other seborrheic keratosis: Secondary | ICD-10-CM | POA: Diagnosis not present

## 2022-08-14 DIAGNOSIS — C44712 Basal cell carcinoma of skin of right lower limb, including hip: Secondary | ICD-10-CM | POA: Diagnosis not present

## 2022-08-14 DIAGNOSIS — L570 Actinic keratosis: Secondary | ICD-10-CM | POA: Diagnosis not present

## 2022-08-14 DIAGNOSIS — Z85828 Personal history of other malignant neoplasm of skin: Secondary | ICD-10-CM | POA: Diagnosis not present

## 2022-08-14 DIAGNOSIS — L814 Other melanin hyperpigmentation: Secondary | ICD-10-CM | POA: Diagnosis not present

## 2022-08-14 DIAGNOSIS — D485 Neoplasm of uncertain behavior of skin: Secondary | ICD-10-CM | POA: Diagnosis not present

## 2022-08-24 ENCOUNTER — Ambulatory Visit: Payer: Medicare Other

## 2022-09-13 ENCOUNTER — Encounter: Payer: Self-pay | Admitting: Gastroenterology

## 2022-09-13 ENCOUNTER — Ambulatory Visit: Payer: Medicare Other | Admitting: Diagnostic Neuroimaging

## 2022-09-13 ENCOUNTER — Ambulatory Visit (INDEPENDENT_AMBULATORY_CARE_PROVIDER_SITE_OTHER): Payer: Medicare Other | Admitting: Gastroenterology

## 2022-09-13 VITALS — BP 126/78 | HR 84 | Ht 66.0 in | Wt 159.0 lb

## 2022-09-13 DIAGNOSIS — Z9049 Acquired absence of other specified parts of digestive tract: Secondary | ICD-10-CM | POA: Diagnosis not present

## 2022-09-13 DIAGNOSIS — R152 Fecal urgency: Secondary | ICD-10-CM

## 2022-09-13 DIAGNOSIS — Z1211 Encounter for screening for malignant neoplasm of colon: Secondary | ICD-10-CM | POA: Diagnosis not present

## 2022-09-13 DIAGNOSIS — R197 Diarrhea, unspecified: Secondary | ICD-10-CM | POA: Diagnosis not present

## 2022-09-13 DIAGNOSIS — R159 Full incontinence of feces: Secondary | ICD-10-CM | POA: Diagnosis not present

## 2022-09-13 NOTE — Patient Instructions (Signed)
_______________________________________________________  If you are age 73 or older, your body mass index should be between 23-30. Your Body mass index is 25.66 kg/m. If this is out of the aforementioned range listed, please consider follow up with your Primary Care Provider.  If you are age 77 or younger, your body mass index should be between 19-25. Your Body mass index is 25.66 kg/m. If this is out of the aformentioned range listed, please consider follow up with your Primary Care Provider.   ________________________________________________________  The [facility] providers would like to encourage you to use [facility] to communicate with providers for non-urgent requests or questions.  Due to long hold times on the telephone, sending your provider a message by [facility] may be a faster and more efficient way to get a response.  Please allow 48 business hours for a response.  Please remember that this is for non-urgent requests.  _______________________________________________________  Dennis Bast have been scheduled for a flexible sigmoidoscopy. Please follow written instructions given to you at your visit today.  Please pick up your prep supplies at the pharmacy within the next 1-3 days. If you use inhalers (even only as needed), please bring them with you on the day of your procedure.  You have been referred for pelvic floor therapy.  Continue Loperamide daily as needed  Take Metamucil daily

## 2022-09-13 NOTE — Progress Notes (Signed)
HPI : [name] is a very pleasant 72 year old female with a history of depression and arthritis who is referred to [place] by Dr. Merri Ray for further evaluation and management of chronic diarrhea and fecal incontinence.  She has a remote history of severe constipation (colonic inertia?) which was treated with a subtotal colectomy in 1986.  Since the surgery has had issues with frequent bowel movements and fecal urgency.  However her diarrhea and fecal incontinence have been worsening recently.  She had a prolonged bout of severe diarrhea which she attributed to a stomach bug in October, which lasted for several weeks.  Since then her symptoms have not returned to baseline.  She reports having  4-7 bowel movements per day.  Her stool is often formed initially, but becomes looser throughout the day.  She has urgency with most bowel movements and has episodes of urge incontinence if she is not able to get to a toilet in time.   In addition, she also has full fecal incontinence without the urge to defecate.  This has been going for about a year, and can occur up to 2-4x/day.  It often occurs while she is sitting.  She will not realize that she has defecated until she stands up and feels the feces.  She denies any episodes of nocturnal incontinence.  She denies any urinary incontinence.  She will have full large volume incontinence when her stools are loose, but usually smaller volume incontinence when her stools are more formed. She has also had symptoms which she thinks were consistent with rectal prolapse.  This was occurring several months ago, but has not happened in several weeks now.  She would have to manually reduce soft, fleshy tissue with several fingers.  This would occur more often with activity, not with bowel movements.  She was referred to surgery by her PCP but has not heard anything from them about scheduling an appointment. Referred to surgery for rectal prolapse; has not heard  anything from   She has been taking loperamide for many years to help with her diarrhea and urgency.  Currently she takes at least one loperamide in the morning and then will take additional tablets as needed if she is going out of the house.  Eating out is very difficult for her.  In October she was was taking loperamide three times a day.  Flexible sigmoidoscopy, Aug 01, 2012 Normal study.  Anastomosis at 50 cm   Past Medical History:  Diagnosis Date   Arthritis    knees, hands   Bursitis of left hip    Cancer ([place])    hx skin cancer   Cataract    Phreesia 09/11/2020   Depression    Depression    Phreesia 09/11/2020   GERD (gastroesophageal reflux disease)    INFREQUENT   Headache    HX MIGRAINES    Heart murmur     MVP 30 yrs ago - no treatment needed   Hyperlipidemia    on statin   Hypertension    "flucuations" has never been on any med   Insomnia    Irritable bowel syndrome    s/p subtotal colectomy '86>diarrhea prone   NEOPLASM, MALIGNANT, BASAL CELL, CARCINOMA, HX OF    Seasonal allergies      Past Surgical History:  Procedure Laterality Date   ABDOMINOPLASTY     ANTERIOR CERVICAL DECOMP/DISCECTOMY FUSION N/A 02/14/2018   Procedure: CERVICAL THREE-FOUR,CERVICAL FOUR-FIVE ACDF, REMOVAL CERVICAL FIVE-SEVEN  PLATE.;  Surgeon: Eustace Moore, MD;  Location: [facility];  Service: Neurosurgery;  Laterality: N/A;  anterior   BACK SURGERY  1999   lower back   BREAST ENHANCEMENT SURGERY  1983   BREAST IMPLANT REMOVAL  08/2010   BREAST SURGERY     Augmentation in 1983, Reduction 2011   BUNIONECTOMY     right  and left foot   CERVICAL SPINE SURGERY  12/18/1999   C5/6 and C6/7   CHOLECYSTECTOMY N/A    Phreesia 09/11/2020   COLECTOMY  1986   COLON SURGERY N/A    Phreesia 09/11/2020   COSMETIC SURGERY     EXCISION/RELEASE BURSA HIP Left 07/04/2015   Procedure: LEFT HIP BURSECTOMY WITH ;  Surgeon: Gaynelle Arabian, MD;  Location: [facility];  Service: Orthopedics;  Laterality:  Left;   EYE SURGERY     Lasik 2010   eyelid surgery     FRACTURE SURGERY     HYSTEROSCOPY WITH D & C  08/17/2011   Procedure: DILATATION AND CURETTAGE (D&C) /HYSTEROSCOPY;  Surgeon: Margarette Asal;  Location: [facility];  Service: Gynecology;  Laterality: N/A;   INJECTION KNEE Right 07/04/2015   Procedure: CORTISONE INJECTION RIGHT KNEE ;  Surgeon: Gaynelle Arabian, MD;  Location: [facility];  Service: Orthopedics;  Laterality: Right;   JOINT REPLACEMENT N/A    Phreesia 09/11/2020   KNEE ARTHROSCOPY     right x 2   KNEE RECONSTRUCTION, MEDIAL PATELLAR FEMORAL LIGAMENT     right   LASIK  2009    bilateral   left foot surgery     removed foreign object   OPEN SURGICAL REPAIR OF GLUTEAL TENDON Left 07/04/2015   Procedure: GLUTEAL TENDON REPAIR ;  Surgeon: Gaynelle Arabian, MD;  Location: [facility];  Service: Orthopedics;  Laterality: Left;   REDUCTION MAMMAPLASTY Bilateral    SPINE SURGERY N/A    Phreesia 09/11/2020   thumb surgery     TOTAL KNEE ARTHROPLASTY Right 06/17/2017   Procedure: RIGHT TOTAL KNEE ARTHROPLASTY;  Surgeon: Gaynelle Arabian, MD;  [facility];  Service: Orthopedics;  Laterality: Right;   TRIGGER FINGER RELEASE Right 09/11/2021   TUBAL LIGATION     Family History  Problem Relation Age of Onset   Hypertension Mother    Heart disease Mother    Stomach cancer Father 61   Cancer Father        stomach and liver cancer   Thyroid disease Sister    Breast cancer Sister    Hypertension Sister    Psoriasis Sister    High Cholesterol Sister    Neuropathy Sister    Diabetes Brother    Heart disease Brother    Hypertension Brother    Hypertension Brother    Heart disease Brother    Cancer Daughter        breast cancer   Hypertension Daughter    Colon cancer Neg Hx    Esophageal cancer Neg Hx    Social History   Tobacco Use   Smoking status: Never   Smokeless tobacco: Never   Tobacco comments:    Married, 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      atorvastatin (LIPITOR) 20 MG tablet Take 1 tablet (20 mg total) by mouth every morning. 90 tablet 3   azithromycin (ZITHROMAX) 250 MG tablet See admin instructions. see package     buPROPion (WELLBUTRIN XL) 300 MG 24 hr tablet Take 1 tablet (300 mg total) by mouth daily. 90 tablet 3   Cyanocobalamin (VITAMIN B12) 1000 MCG TBCR Take 1,000 mcg by mouth daily.     diclofenac sodium (VOLTAREN) 1 % GEL Apply 1 g topically daily as needed.      fluticasone (FLONASE) 50 MCG/ACT nasal spray      furosemide (LASIX) 40 MG tablet TAKE 1 TABLET DAILY or as advised by cardioloy 90 tablet 3   HYDROcodone-acetaminophen (NORCO/VICODIN) 5-325 MG tablet Take 1 tablet by mouth every 6 (six) hours as needed.     Liniments (SALONPAS PAIN RELIEF PATCH EX) Apply 2 patches topically daily.     loperamide (IMODIUM A-D) 2 MG tablet Take 1 tablet (2 mg total) by mouth 3 (three) times daily as needed for diarrhea or loose stools. 90 tablet 3   loratadine (CLARITIN) 10 MG tablet Take 10 mg by mouth daily.     MAGNESIUM GLYCINATE PO Take 450 mg by mouth.     niacinamide 500 MG tablet Take 500 mg by mouth 2 (two) times daily with a meal.     potassium chloride (KLOR-CON) 10 MEQ tablet Take 1 tablet by mouth on Monday, Wednesday, Friday and Saturday each week 48 tablet 3   promethazine (PHENERGAN) 25 MG tablet Take 1 tablet (25 mg total) by mouth every 6 (six) hours as needed for nausea or vomiting. 10 tablet 1   Simethicone (PHAZYME PO) Take 1 tablet by mouth every morning.      No current facility-administered medications for this visit.   Allergies  Allergen  Reactions   Nsaids Other (See Comments)    Hurts stomach   Other Rash    Retinol cream     Review of Systems: All systems reviewed and negative except where noted in HPI.    No results found.  Physical Exam: BP 126/78   Pulse 84   Ht '5\' 6"'$  (1.676 m)   Wt 159 lb (72.1 kg)   SpO2 99%   BMI 25.66 kg/m  Constitutional: Pleasant,well-developed, Caucasian female in no acute distress. HEENT: Normocephalic and atraumatic. Conjunctivae are normal. No scleral icterus. Neck supple.  Cardiovascular: Normal rate, regular rhythm.  Pulmonary/chest: Effort normal and breath sounds normal. No wheezing, rales or rhonchi. Abdominal: Soft, nondistended, nontender. Bowel sounds active throughout. There are no masses palpable. No hepatomegaly. Extremities: no edema Rectal: CMA Julieanne Cotton present:  No external hemorrhoids or skin tags.  No prolapsed hemorrhoids or anal fissures.  Anal wink present.  Digital rectal exam notable for decreased resting sphincter tone.  Weak EAS squeeze.  Appropriate pelvic floor descent and puborectalis relaxation.  Nor rectal or hemorrhoid prolapse appreciated Neurological: Alert and oriented to person place and time. Skin: Skin is warm and dry. No rashes noted. Psychiatric: Normal mood and affect. Behavior is normal.  CBC    Component Value Date/Time   WBC 7.1 01/31/2018 1020   RBC 5.26 (H) 01/31/2018 1020   HGB 15.8 (H) 01/31/2018 1020   HCT 48.5 (H) 01/31/2018 1020   PLT 275 01/31/2018 1020   MCV 92.2 01/31/2018 1020   MCH 30.0 01/31/2018 1020  MCHC 32.6 01/31/2018 1020   RDW 12.8 01/31/2018 1020   LYMPHSABS 2.3 01/31/2018 1020   MONOABS 0.6 01/31/2018 1020   EOSABS 0.2 01/31/2018 1020   BASOSABS 0.1 01/31/2018 1020    CMP     Component Value Date/Time   NA 140 07/26/2022 1514   NA 143 10/12/2021 1438   K 4.8 07/26/2022 1514   CL 103 07/26/2022 1514   CO2 29 07/26/2022 1514   GLUCOSE 89 07/26/2022 1514   BUN 15 07/26/2022 1514   BUN 17  10/12/2021 1438   CREATININE 0.90 07/26/2022 1514   CREATININE 0.89 09/14/2015 1616   CALCIUM 9.9 07/26/2022 1514   PROT 7.6 07/26/2022 1514   PROT 7.1 05/27/2020 1133   ALBUMIN 4.9 07/26/2022 1514   ALBUMIN 5.2 (H) 05/27/2020 1133   AST 18 07/26/2022 1514   ALT 17 07/26/2022 1514   ALKPHOS 68 07/26/2022 1514   BILITOT 0.5 07/26/2022 1514   BILITOT 0.5 05/27/2020 1133   GFRNONAA 73 05/27/2020 1133   GFRAA 84 05/27/2020 1133     ASSESSMENT AND PLAN: 72 year old female with history of what sounds like colonic inertia requiring subtotal colectomy in 1986, now with worsening diarrhea and fecal incontinence, both urge and passive.  Physical exam notable for weak resting tone and EAS squeeze.  I think she may benefit from pelvic floor physical therapy, although she may require more invasive interventions such as sacral nerve stimulators.  Will refer to PT first and I advised her to add metamucil daily to improve her stool bulk and consistency.  She can continue taking loperamide daily and as needed as she is doing.  Although she is s/p subtotal colectomy, she is still at risk for colorectal cancer in her remnant colon.  She had a flexible sigmoidoscopy in 2013 which indicated she had 50 cm of colon.  I recommended we repeat a flexible sigmoidoscopy.  If no polyps, she likely will not need any additional CRC screening.  Diarrhea/Fecal incontinence - Pelvic floor physical therapy - Daily metamucil - Daily loperamide with PRN usage - Referral for sacral nerve stimulator if no improvement with PT.  CRC screening - Flexible sigmoidoscopy  The details, risks (including bleeding, perforation, infection, missed lesions, medication reactions and possible hospitalization or surgery if complications occur), benefits, and alternatives to flexible sigmoidoscopy with possible biopsy and possible polypectomy were discussed with the patient and she consents to proceed.   Charlton Boule E. Candis Schatz, MD [facility]  CC:  Wendie Agreste, MD

## 2022-10-01 ENCOUNTER — Ambulatory Visit
Admission: RE | Admit: 2022-10-01 | Discharge: 2022-10-01 | Disposition: A | Discharge status: Home / Self Care | Payer: Medicare Other | Source: Ambulatory Visit | Attending: Family Medicine | Admitting: Family Medicine

## 2022-10-01 DIAGNOSIS — Z1231 Encounter for screening mammogram for malignant neoplasm of breast: Secondary | ICD-10-CM | POA: Diagnosis not present

## 2022-10-02 DIAGNOSIS — Z961 Presence of intraocular lens: Secondary | ICD-10-CM | POA: Diagnosis not present

## 2022-10-02 DIAGNOSIS — H2511 Age-related nuclear cataract, right eye: Secondary | ICD-10-CM | POA: Diagnosis not present

## 2022-10-02 DIAGNOSIS — H269 Unspecified cataract: Secondary | ICD-10-CM | POA: Diagnosis not present

## 2022-10-04 ENCOUNTER — Encounter: Payer: Self-pay | Admitting: Physical Therapy

## 2022-10-04 ENCOUNTER — Ambulatory Visit: Payer: Medicare Other | Attending: Gastroenterology | Admitting: Physical Therapy

## 2022-10-04 ENCOUNTER — Other Ambulatory Visit: Payer: Self-pay

## 2022-10-04 DIAGNOSIS — M62838 Other muscle spasm: Secondary | ICD-10-CM

## 2022-10-04 DIAGNOSIS — R279 Unspecified lack of coordination: Secondary | ICD-10-CM | POA: Diagnosis not present

## 2022-10-04 DIAGNOSIS — M6281 Muscle weakness (generalized): Secondary | ICD-10-CM | POA: Insufficient documentation

## 2022-10-04 NOTE — Therapy (Addendum)
OUTPATIENT PHYSICAL THERAPY FEMALE PELVIC EVALUATION   Patient Name: [name] MRN: [id number] DOB:28-Sep-1950, 72 y.o., female Today's Date: 10/04/2022  END OF SESSION:  PT End of Session - 10/04/22 1402     Visit Number 1    Date for PT Re-Evaluation 12/27/22    Authorization Type medicare    PT Start Time 1234    PT Stop Time 1316    PT Time Calculation (min) 42 min    Activity Tolerance Patient tolerated treatment well    Behavior During Therapy WFL for tasks assessed/performed             Past Medical History:  Diagnosis Date   Arthritis    knees, hands   Bursitis of left hip    Cancer ([place])    hx skin cancer   Cataract    Phreesia 09/11/2020   Depression    Depression    Phreesia 09/11/2020   GERD (gastroesophageal reflux disease)    INFREQUENT   Headache    HX MIGRAINES    Heart murmur     MVP 30 yrs ago - no treatment needed   Hyperlipidemia    on statin   Hypertension    "flucuations" has never been on any med   Insomnia    Irritable bowel syndrome    s/p subtotal colectomy '86>diarrhea prone   NEOPLASM, MALIGNANT, BASAL CELL, CARCINOMA, HX OF    Seasonal allergies    Past Surgical History:  Procedure Laterality Date   ABDOMINOPLASTY     ANTERIOR CERVICAL DECOMP/DISCECTOMY FUSION N/A 02/14/2018   Procedure: CERVICAL THREE-FOUR,CERVICAL FOUR-FIVE ACDF, REMOVAL CERVICAL FIVE-SEVEN  PLATE.;  Surgeon: Eustace Moore, MD;  Location: [facility];  Service: Neurosurgery;  Laterality: N/A;  anterior   BACK SURGERY  1999   lower back   BREAST ENHANCEMENT SURGERY  1983   BREAST IMPLANT REMOVAL  08/2010   BREAST SURGERY     Augmentation in 1983, Reduction 2011   BUNIONECTOMY     right  and left foot   CERVICAL SPINE SURGERY  12/18/1999   C5/6 and C6/7   CHOLECYSTECTOMY N/A    Phreesia 09/11/2020   COLECTOMY  1986   COLON SURGERY N/A    Phreesia 09/11/2020   COSMETIC SURGERY     EXCISION/RELEASE BURSA HIP Left 07/04/2015   Procedure: LEFT  HIP BURSECTOMY WITH ;  Surgeon: Gaynelle Arabian, MD;  [facility];  Service: Orthopedics;  Laterality: Left;   EYE SURGERY     Lasik 2010   eyelid surgery     FRACTURE SURGERY     HYSTEROSCOPY WITH D & C  08/17/2011   Procedure: DILATATION AND CURETTAGE (D&C) /HYSTEROSCOPY;  Surgeon: Margarette Asal;  Location: [facility];  Service: Gynecology;  Laterality: N/A;   INJECTION KNEE Right 07/04/2015   Procedure: CORTISONE INJECTION RIGHT KNEE ;  Surgeon: Gaynelle Arabian, MD;  [facility];  Service: Orthopedics;  Laterality: Right;   JOINT REPLACEMENT N/A    Phreesia 09/11/2020   KNEE ARTHROSCOPY     right x 2   KNEE RECONSTRUCTION, MEDIAL PATELLAR FEMORAL LIGAMENT     right   LASIK  2009    bilateral   left foot surgery     removed foreign object   OPEN SURGICAL REPAIR OF GLUTEAL TENDON Left 07/04/2015   Procedure: GLUTEAL TENDON REPAIR ;  Surgeon: Gaynelle Arabian, MD;  [facility];  Service: Orthopedics;  Laterality: Left;   REDUCTION MAMMAPLASTY Bilateral  SPINE SURGERY N/A    Phreesia 09/11/2020   thumb surgery     TOTAL KNEE ARTHROPLASTY Right 06/17/2017   Procedure: RIGHT TOTAL KNEE ARTHROPLASTY;  Surgeon: Gaynelle Arabian, MD;  [facility];  Service: Orthopedics;  Laterality: Right;   TRIGGER FINGER RELEASE Right 09/11/2021   TUBAL LIGATION     Patient Active Problem List   Diagnosis Date Noted   chronic diastolic chf 50/05/3817   Cervical vertebral fusion 02/14/2018   OA (osteoarthritis) of knee 06/17/2017   Trochanteric bursitis of left hip 07/03/2015   Routine general medical examination at a health care facility 11/18/2014   CARCINOMA, SKIN, SQUAMOUS CELL 07/06/2010   ACTINIC KERATOSIS 07/06/2010   NEOPLASM, MALIGNANT, BASAL CELL, CARCINOMA, HX OF 07/06/2010   Hyperlipidemia 06/08/2009   DEPRESSION 06/08/2009   POSTMENOPAUSAL STATUS 06/08/2009   IRRITABLE BOWEL SYNDROME 02/17/2008   BACK PAIN, CHRONIC 02/17/2008   MIGRAINE HEADACHE 02/05/2007    Essential hypertension 02/05/2007    PCP: Wendie Agreste, MD   REFERRING PROVIDER: Daryel November, MD   REFERRING DIAG:  Z12.11 (ICD-10-CM) - Colon cancer screening  R19.7 (ICD-10-CM) - Diarrhea, unspecified type  R15.9 (ICD-10-CM) - Incontinence of feces, unspecified fecal incontinence type    THERAPY DIAG:  Unspecified lack of coordination  Muscle weakness (generalized)  Other muscle spasm  Rationale for Evaluation and Treatment: Rehabilitation  ONSET DATE: November 2023  SUBJECTIVE:                                                                                                                                                                                           SUBJECTIVE STATEMENT: In 1986 had large intestine removed except 6 inches.  Pt went 6 weeks without a bowel movement when I was 72 y/o which may have contributed to this issue but not sure.  This last November had food poisoning and had diarrhea for a month.  Now stool more regular but I still have incontinence even with solid.  I feel the rectum coming out and can squeeze and it comes back up . Even before the prolapse I had a lot of bowel urgency.  After I eat a meal, bowels move that food through by the next meal.  I take imodium daily and then extra if going out.  Taking lactaid helps keep things less loose also.  I have chronic back pain and have had back surgery  Fluid intake: Yes: decaf ice tea 6-8 glasses    PAIN:  Are you having pain? No  PRECAUTIONS: None  WEIGHT BEARING RESTRICTIONS: No  FALLS:  Has patient fallen in last 6 months? No  LIVING ENVIRONMENT: Lives with: lives with their spouse Lives in: House/apartment   OCCUPATION: retired  PLOF: Independent  PATIENT GOALS: get to the bathroom and reduce the prolapse  PERTINENT HISTORY:  PMH: Rt TKA, abdominoplasty, breast surgery, lumbar and cervical surgery, tubal ligation, IBS Sexual abuse: No  BOWEL MOVEMENT: Pain with bowel  movement: No Type of bowel movement:Type (Bristol Stool Scale) varies usually loose sometimes solid, Frequency several per day up to 3-4/hour, and Strain No Fully empty rectum: Yes:   Leakage: Yes:   Pads: Yes: panty liner all the time  changing 2/day and a bigger accident 1 per week  Fiber supplement: may start metamucil or discuss with MD  URINATION: Pain with urination: No Fully empty bladder: Yes:   Stream: Strong Urgency: No Frequency: 1 nocturia Leakage:  No Pads: No  INTERCOURSE: Pain with intercourse: Not had for 1-2 years   PREGNANCY: Vaginal deliveries 2 Tearing Yes: some   PROLAPSE: Rectal prolapse   OBJECTIVE:   DIAGNOSTIC FINDINGS:    PATIENT SURVEYS:    PFIQ-7   COGNITION: Overall cognitive status: Within functional limits for tasks assessed     SENSATION: Light touch: Appears intact Proprioception: Appears intact  MUSCLE LENGTH: Hamstrings: WFL  Thomas test:   LUMBAR SPECIAL TESTS:  Straight leg raise test: Negative  FUNCTIONAL TESTS:  Single leg stand - Rt leg instability  GAIT:  Comments: mild trendelenburg, knee valgus  POSTURE: rounded shoulders and increased thoracic kyphosis  PELVIC ALIGNMENT: normal  LUMBARAROM/PROM:  A/PROM A/PROM  eval  Flexion 80%  Extension   Right lateral flexion   Left lateral flexion   Right rotation   Left rotation    (Blank rows = not tested)  LOWER EXTREMITY ROM:  Passive ROM Right eval Left eval  Hip flexion [facility]   Hip extension    Hip abduction    Hip adduction    Hip internal rotation 50% 50%  Hip external rotation 75% 50%  Knee flexion    Knee extension    Ankle dorsiflexion    Ankle plantarflexion    Ankle inversion    Ankle eversion     (Blank rows = not tested)  LOWER EXTREMITY MMT: not tested due to time  MMT Right eval Left eval  Hip flexion    Hip extension    Hip abduction    Hip adduction    Hip internal rotation    Hip external rotation    Knee  flexion    Knee extension    Ankle dorsiflexion    Ankle plantarflexion    Ankle inversion    Ankle eversion     PALPATION:   General  lumbar and gluteal tension                External Perineal Exam good anal wink                             Internal Pelvic Floor using glutes, difficulty relaxing, no pain  Patient confirms identification and approves PT to assess internal pelvic floor and treatment Yes  PELVIC MMT:   MMT eval  Vaginal   Internal Anal Sphincter 2/5  External Anal Sphincter 3/5  Puborectalis 3/5  Diastasis Recti   (Blank rows = not tested)        TONE: Normal but slow to relax  PROLAPSE: Was not present in sidelying  TODAY'S TREATMENT:  DATE: 10/04/22  EVAL and info and educated on toileting and prolapse positions Educated on peri bottle and moist wipes   PATIENT EDUCATION:  Education details: toileting and prolapse handout Person educated: Patient Education method: Consulting civil engineer, Media planner, Verbal cues, and Handouts Education comprehension: verbalized understanding  HOME EXERCISE PROGRAM: None give today  ASSESSMENT:  CLINICAL IMPRESSION: Patient is a 72 y.o. female who was seen today for physical therapy evaluation and treatment for fecal incontinence.  Posture and functional strength assessed as noted above with some increased thoracic kyphosis and Rt hip weakness.  Pt has impaired strength and coordination of the pelvic floor which is slow to relax after contracting . Pt has skin redness and irritation of the perineum and tension that may be contributing to lack of sensation of the stool before it is very urgent.  Pt will benefit from skilled PT to address all above mentioned impairments and return to functional activities without urgency and leakage.  OBJECTIVE IMPAIRMENTS: decreased coordination, decreased endurance,  decreased ROM, decreased strength, increased muscle spasms, impaired flexibility, impaired tone, postural dysfunction, and pain.   ACTIVITY LIMITATIONS: sitting, continence, and toileting  PARTICIPATION LIMITATIONS: interpersonal relationship and community activity  PERSONAL FACTORS: 3+ comorbidities: colon removal, 2 vaginal deliveries, Rt TKA, abdominoplasty, breast surgery, lumbar and cervical surgery, tubal ligation, IBS  are also affecting patient's functional outcome.   REHAB POTENTIAL: Good  CLINICAL DECISION MAKING: Evolving/moderate complexity  EVALUATION COMPLEXITY: Moderate   GOALS: Goals reviewed with patient? Yes  SHORT TERM GOALS: Target date: 11/01/22  50% less prolapse when sitting, walking, and after toileting Baseline: Goal status: INITIAL  2.  Pt will have reduced redness of peri anal skin  due to improved cleaning techniques Baseline:  Goal status: INITIAL  3.  Ind with toileting techniques and initial HEP Baseline:  Goal status: INITIAL    LONG TERM GOALS: Target date: 12/27/22  Pt will be independent with advanced HEP to maintain improvements made throughout therapy  Baseline:  Goal status: INITIAL  2.  Pt will report 65% reduction of pain due to improvements in posture, strength, and muscle length  Baseline:  Goal status: INITIAL  3.  Pt will be able to functional actions such as walking to the bathroom without leakage  Baseline:  Goal status: INITIAL  4.  Pt will not have to use imodium more than 1/day due to improved muscle strength and improved sensation to know when they need to use the bathroom Baseline:  Goal status: INITIAL  5.  Pt will be able to sit or stand for at least one hour without increased rectal prolapse due to improved muscle tone Baseline:  Goal status: INITIAL   PLAN:  PT FREQUENCY: 1x/week  PT DURATION: 12 weeks  PLANNED INTERVENTIONS: Therapeutic exercises, Therapeutic activity, Neuromuscular re-education,  Balance training, Gait training, Patient/Family education, Self Care, Joint mobilization, Dry Needling, Electrical stimulation, Cryotherapy, Moist heat, Taping, Biofeedback, Manual therapy, and Re-evaluation  PLAN FOR NEXT SESSION: try moisturizer, breathing and bulging, lumbar and hip stretches for flexibility and back pain, f/u on peri bottle   Camillo Flaming Moyinoluwa Dawe, PT 10/04/2022, 2:03 PM   PHYSICAL THERAPY DISCHARGE SUMMARY  Visits from Start of Care: 1  Current functional level related to goals / functional outcomes: See above goals   Remaining deficits: See above   Education / Equipment: HEP   Patient agrees to discharge. Patient goals were not met. Patient is being discharged due to the patient's request. Pt called and was feeling better, one visit only  Gustavus Bryant, PT, DPT 12/03/22 8:17 AM

## 2022-10-08 ENCOUNTER — Encounter: Payer: Self-pay | Admitting: Gastroenterology

## 2022-10-08 ENCOUNTER — Ambulatory Visit (AMBULATORY_SURGERY_CENTER): Payer: Medicare Other | Admitting: Gastroenterology

## 2022-10-08 VITALS — BP 117/67 | HR 67 | Temp 97.1°F | Resp 13 | Ht 66.0 in | Wt 159.0 lb

## 2022-10-08 DIAGNOSIS — R159 Full incontinence of feces: Secondary | ICD-10-CM | POA: Diagnosis not present

## 2022-10-08 DIAGNOSIS — Z1211 Encounter for screening for malignant neoplasm of colon: Secondary | ICD-10-CM

## 2022-10-08 MED ORDER — SODIUM CHLORIDE 0.9 % IV SOLN: 500.0000 mL | Freq: Once | INTRAVENOUS | Status: DC

## 2022-10-08 NOTE — Progress Notes (Signed)
Eye protector placed over patient's right eye and taped in place with paper tape, per patient's instructions. She is concerned with touching her eye upon wake up, as she has recently had surgery on her right eye. A Shadell Brenn CRNA

## 2022-10-08 NOTE — Patient Instructions (Addendum)
Recommendation: Discharge patient to home (with escort).                           - Resume previous diet.                           - Continue present medications.                           - Given patient's age and lack of polyps, recommend                            against further colon cancer screening.  YOU HAD AN ENDOSCOPIC PROCEDURE TODAY AT [facility]:   Refer to the procedure report that was given to you for any specific questions about what was found during the examination.  If the procedure report does not answer your questions, please call your gastroenterologist to clarify.  If you requested that your care partner not be given the details of your procedure findings, then the procedure report has been included in a sealed envelope for you to review at your convenience later.  YOU SHOULD EXPECT: Some feelings of bloating in the abdomen. Passage of more gas than usual.  Walking can help get rid of the air that was put into your GI tract during the procedure and reduce the bloating. If you had a lower endoscopy (such as a colonoscopy or flexible sigmoidoscopy) you may notice spotting of blood in your stool or on the toilet paper. If you underwent a bowel prep for your procedure, you may not have a normal bowel movement for a few days.  Please Note:  You might notice some irritation and congestion in your nose or some drainage.  This is from the oxygen used during your procedure.  There is no need for concern and it should clear up in a day or so.  SYMPTOMS TO REPORT IMMEDIATELY:  Following lower endoscopy (colonoscopy or flexible sigmoidoscopy):  Excessive amounts of blood in the stool  Significant tenderness or worsening of abdominal pains  Swelling of the abdomen that is new, acute  Fever of 100F or higher  For urgent or emergent issues, a gastroenterologist can be reached at any hour by calling [phone]. Do not use MyChart messaging for urgent concerns.    DIET:  We do recommend a small meal at first, but then you may proceed to your regular diet.  Drink plenty of fluids but you should avoid alcoholic beverages for 24 hours.  ACTIVITY:  You should plan to take it easy for the rest of today and you should NOT DRIVE or use heavy machinery until tomorrow (because of the sedation medicines used during the test).    FOLLOW UP: Our staff will call the number listed on your records the next business day following your procedure.  We will call around 7:15- 8:00 am to check on you and address any questions or concerns that you may have regarding the information given to you following your procedure. If we do not reach you, we will leave a message.     If any biopsies were taken you will be contacted by phone or by letter within the next 1-3 weeks.  Please call us at [phone] if you have not heard about the biopsies in 3 weeks.  SIGNATURES/CONFIDENTIALITY: You and/or your care partner have signed paperwork which will be entered into your electronic medical record.  These signatures attest to the fact that that the information above on your After Visit Summary has been reviewed and is understood.  Full responsibility of the confidentiality of this discharge information lies with you and/or your care-partner.

## 2022-10-08 NOTE — Progress Notes (Signed)
Report to pacu rn. Vss. Care resumed by rn. 

## 2022-10-08 NOTE — Op Note (Signed)
[name] Procedure Date: 10/08/2022 2:10 PM MRN: [id number] Endoscopist: Nicki Reaper E. Candis Schatz , MD, [id number] Age: 72 Referring MD:  Date of Birth: [birth year] Gender: Female Account #: [id number] Procedure:                Colonoscopy of Post-surgical Anatomy Indications:              Screening for colorectal malignant neoplasm:                            Patient is s/p subtotal colectomy for colonic                            inertia. Medicines:                Monitored Anesthesia Care Procedure:                Pre-Anesthesia Assessment:                           - Prior to the procedure, a History and Physical                            was performed, and patient medications and                            allergies were reviewed. The patient's tolerance of                            previous anesthesia was also reviewed. The risks                            and benefits of the procedure and the sedation                            options and risks were discussed with the patient.                            All questions were answered, and informed consent                            was obtained. Prior Anticoagulants: The patient has                            taken no anticoagulant or antiplatelet agents. ASA                            Grade Assessment: II - A patient with mild systemic                            disease. After reviewing the risks and benefits,                            the patient was deemed in satisfactory condition to  undergo the procedure.                           After obtaining informed consent, the endoscope was                            passed under direct vision. Throughout the                            procedure, the patient's blood pressure, pulse, and                            oxygen saturations were monitored continuously. The                            Endoscope was introduced  through the anus and                            advanced to the 50 cm from the anal verge. After                            obtaining informed consent, the endoscope was                            passed under direct vision. Throughout the                            procedure, the patient's blood pressure, pulse, and                            oxygen saturations were monitored continuously.The                            flexible sigmoidoscopy was accomplished without                            difficulty. The patient tolerated the procedure                            well. The quality of the bowel preparation was                            adequate. Visualization was difficult due to                            inability maintain insufflation Scope In: 2:30:55 PM Scope Out: 2:40:21 PM Total Procedure Duration: 0 hours 9 minutes 26 seconds  Findings:                 The perianal examination was normal.                           The digital rectal exam findings include decreased  sphincter tone. Pertinent negatives include no                            palpable rectal lesions.                           There was evidence of a prior end-to-end                            ileo-colonic anastomosis at 40 cm proximal to the                            anus. This was patent and was characterized by                            healthy appearing mucosa.                           The neo-terminal ileum appeared normal.                           Patient is status-post subtotal colectomy with an                            end-to-end ileo-colonic anastomosis. Complications:            No immediate complications. Estimated Blood Loss:     Estimated blood loss: none. Impression:               - Decreased sphincter tone found on digital rectal                            exam.                           - Patent end-to-end ileo-colonic anastomosis,                             characterized by healthy appearing mucosa.                           - The examined portion of the ileum was normal.                           - No specimens collected. Recommendation:           - Discharge patient to home (with escort).                           - Resume previous diet.                           - Continue present medications.                           - Given patient's age and lack of polyps, recommend  against further colon cancer screening. Omni Dunsworth E. Candis Schatz, MD 10/08/2022 2:53:15 PM This report has been signed electronically.

## 2022-10-08 NOTE — Progress Notes (Signed)
History and Physical Interval Note:  10/08/2022 2:19 PM  [name]  has presented today for endoscopic procedure(s), with the diagnosis of  Encounter Diagnosis  Name Primary?   Full incontinence of feces Yes  .  The various methods of evaluation and treatment have been discussed with the patient and/or family. After consideration of risks, benefits and other options for treatment, the patient has consented to  the endoscopic procedure(s).   The patient's history has been reviewed, patient examined, no change in status, stable for endoscopic procedure(s).  I have reviewed the patient's chart and labs.  Questions were answered to the patient's satisfaction.     Auburn Hester E. Candis Schatz, MD [facility]

## 2022-10-08 NOTE — Progress Notes (Signed)
Vitals-CW  Pt's states no medical or surgical changes since previsit or office visit. 

## 2022-10-09 ENCOUNTER — Telehealth: Payer: Self-pay | Admitting: *Deleted

## 2022-10-09 DIAGNOSIS — C44719 Basal cell carcinoma of skin of left lower limb, including hip: Secondary | ICD-10-CM | POA: Diagnosis not present

## 2022-10-09 NOTE — Telephone Encounter (Signed)
  Follow up Call-     10/08/2022    1:43 PM 10/08/2022    1:37 PM  Call back number  Post procedure Call Back phone  # [phone]   Permission to leave phone message  Yes     Patient questions:  Message left to call us if necessary.

## 2022-10-23 DIAGNOSIS — H2512 Age-related nuclear cataract, left eye: Secondary | ICD-10-CM | POA: Diagnosis not present

## 2022-10-23 DIAGNOSIS — Z961 Presence of intraocular lens: Secondary | ICD-10-CM | POA: Diagnosis not present

## 2022-10-23 DIAGNOSIS — Z9842 Cataract extraction status, left eye: Secondary | ICD-10-CM | POA: Insufficient documentation

## 2022-10-23 DIAGNOSIS — H268 Other specified cataract: Secondary | ICD-10-CM | POA: Diagnosis not present

## 2022-10-23 DIAGNOSIS — H269 Unspecified cataract: Secondary | ICD-10-CM | POA: Diagnosis not present

## 2022-10-24 ENCOUNTER — Other Ambulatory Visit: Payer: Self-pay | Admitting: Cardiovascular Disease

## 2022-11-08 DIAGNOSIS — M65341 Trigger finger, right ring finger: Secondary | ICD-10-CM | POA: Diagnosis not present

## 2022-11-09 ENCOUNTER — Encounter: Payer: Medicare Other | Admitting: Physical Therapy

## 2022-11-23 ENCOUNTER — Encounter: Payer: Medicare Other | Admitting: Physical Therapy

## 2022-11-26 DIAGNOSIS — C44712 Basal cell carcinoma of skin of right lower limb, including hip: Secondary | ICD-10-CM | POA: Diagnosis not present

## 2022-11-30 ENCOUNTER — Encounter: Payer: Medicare Other | Admitting: Physical Therapy

## 2022-12-05 ENCOUNTER — Encounter: Payer: Medicare Other | Admitting: Physical Therapy

## 2022-12-14 ENCOUNTER — Encounter: Payer: Medicare Other | Admitting: Physical Therapy

## 2022-12-15 DIAGNOSIS — M75122 Complete rotator cuff tear or rupture of left shoulder, not specified as traumatic: Secondary | ICD-10-CM | POA: Insufficient documentation

## 2022-12-21 ENCOUNTER — Encounter: Payer: Medicare Other | Admitting: Physical Therapy

## 2022-12-25 ENCOUNTER — Encounter: Payer: Medicare Other | Admitting: Physical Therapy

## 2023-01-19 ENCOUNTER — Other Ambulatory Visit: Payer: Self-pay | Admitting: Cardiovascular Disease

## 2023-01-21 ENCOUNTER — Encounter: Payer: Self-pay | Admitting: Family Medicine

## 2023-01-21 ENCOUNTER — Ambulatory Visit (INDEPENDENT_AMBULATORY_CARE_PROVIDER_SITE_OTHER): Payer: Medicare Other | Admitting: Family Medicine

## 2023-01-21 VITALS — BP 128/78 | HR 74 | Temp 98.0°F | Resp 16 | Ht 66.0 in | Wt 162.2 lb

## 2023-01-21 DIAGNOSIS — E782 Mixed hyperlipidemia: Secondary | ICD-10-CM

## 2023-01-21 DIAGNOSIS — S7011XA Contusion of right thigh, initial encounter: Secondary | ICD-10-CM

## 2023-01-21 DIAGNOSIS — M25512 Pain in left shoulder: Secondary | ICD-10-CM

## 2023-01-21 DIAGNOSIS — I10 Essential (primary) hypertension: Secondary | ICD-10-CM

## 2023-01-21 DIAGNOSIS — F339 Major depressive disorder, recurrent, unspecified: Secondary | ICD-10-CM | POA: Diagnosis not present

## 2023-01-21 DIAGNOSIS — M79651 Pain in right thigh: Secondary | ICD-10-CM | POA: Diagnosis not present

## 2023-01-21 LAB — COMPREHENSIVE METABOLIC PANEL
ALT: 22 U/L (ref 0–35)
AST: 26 U/L (ref 0–37)
Albumin: 4.9 g/dL (ref 3.5–5.2)
Alkaline Phosphatase: 62 U/L (ref 39–117)
BUN: 16 mg/dL (ref 6–23)
CO2: 27 mEq/L (ref 19–32)
Calcium: 10.4 mg/dL (ref 8.4–10.5)
Chloride: 104 mEq/L (ref 96–112)
Creatinine, Ser: 0.8 mg/dL (ref 0.40–1.20)
GFR: 73.5 mL/min (ref >60.00)
Glucose, Bld: 85 mg/dL (ref 70–99)
Potassium: 4.3 mEq/L (ref 3.5–5.1)
Sodium: 141 mEq/L (ref 135–145)
Total Bilirubin: 0.7 mg/dL (ref 0.2–1.2)
Total Protein: 7.6 g/dL (ref 6.0–8.3)

## 2023-01-21 LAB — LIPID PANEL
Cholesterol: 173 mg/dL (ref 0–200)
HDL: 69.3 mg/dL (ref >39.00)
LDL Cholesterol: 75 mg/dL (ref 0–99)
NonHDL: 103.78
Total CHOL/HDL Ratio: 2
Triglycerides: 142 mg/dL (ref 0.0–149.0)
VLDL: 28.4 mg/dL (ref 0.0–40.0)

## 2023-01-21 NOTE — Patient Instructions (Signed)
Thanks for coming today.  No medication changes at this time.  I am sorry to hear about your fall.  Keep follow-up with orthopedics as planned this afternoon.  The area on the outside of your thigh does appear to be a contusion, see information below.  If you have any worsening pain or pain with weightbearing I do recommend imaging or x-rays.  This can also be discussed with orthopedics visit today.  Follow-up with me in 6 months sooner if any new or worsening symptoms.  Hang in there.    Contusion A contusion is a deep bruise. Contusions are the result of a blunt injury to tissues and muscle fibers under the skin. The injury causes bleeding under the skin. The skin over the contusion may turn blue, purple, or yellow. Minor injuries will give you a painless contusion, but more severe injuries cause contusions that can stay painful and swollen for a few weeks. Follow these instructions at home: Pay attention to any changes in your symptoms. Let your health care provider know about them. Take these actions to relieve your pain. Managing pain, stiffness, and swelling  Use resting, icing, applying pressure (compression), and raising (elevating) the injured area. This is often called the RICE method. Rest the injured area. Return to your normal activities as told by your health care provider. Ask your health care provider what activities are safe for you. If directed, put ice on the injured area. To do this: Put ice in a plastic bag. Place a towel between your skin and the bag. Leave the ice on for 20 minutes, 2-3 times a day. If your skin turns bright red, remove the ice right away to prevent skin damage. The risk of skin damage is higher if you cannot feel pain, heat, or cold. If directed, apply light compression to the injured area using an elastic bandage. Make sure the bandage is not wrapped too tightly. Remove and reapply the bandage as directed by your health care provider. If possible, elevate  the injured area above the level of your heart while you are sitting or lying down. General instructions Take over-the-counter and prescription medicines only as told by your health care provider. Keep all follow-up visits. Your health care provider may want to see how your contusion is healing with treatment. Contact a health care provider if: Your symptoms do not improve after several days of treatment. Your symptoms get worse. You have difficulty moving the injured area. Get help right away if: You have severe pain. You have numbness in a hand or foot. Your hand or foot turns pale or cold. This information is not intended to replace advice given to you by your health care provider. Make sure you discuss any questions you have with your health care provider. Document Revised: 02/12/2022 Document Reviewed: 02/12/2022 Elsevier Patient Education  2023 [employer].

## 2023-01-21 NOTE — Progress Notes (Signed)
Subjective:  Patient ID: [name], female    DOB: 09-08-1951  Age: 72 y.o. MRN: [id number]  CC:  Chief Complaint  Patient presents with   Follow-up    Follow up    HPI [name] presents for   [name] while on sailing charter, 10 days ago, stepping into boat, missed the boat - hit R thigh. Able to weight bear, no hip or knee pain - sore on thigh and some bruising/knot. Tx: ice, diclofenac has been helping.  Reaching for dingy with left arm. Increased pain in left shoulder.  Had preceding pain in left shoulder - sore to sleep after pain with backswing about 6 weeks ago at golf.  Will be seeing shoulder specialist today.   Hypertension: Chronic diastolic CHF, hypertensive heart disease, cardiologist Dr.Nahser.  Lasix 40mg  4 days per week with potassium.  Home readings: BP Readings from Last 3 Encounters:  01/21/23 128/78  10/08/22 117/67  09/13/22 126/78   Lab Results  Component Value Date   CREATININE 0.90 07/26/2022   Hyperlipidemia: Lipitor 20 mg daily, no new side effects.  Lab Results  Component Value Date   CHOL 152 07/26/2022   HDL 58.70 07/26/2022   LDLCALC 65 07/26/2022   TRIG 137.0 07/26/2022   CHOLHDL 3 07/26/2022   Lab Results  Component Value Date   ALT 17 07/26/2022   AST 18 07/26/2022   ALKPHOS 68 07/26/2022   BILITOT 0.5 07/26/2022   Depression: Treated with Wellbutrin 300 mg daily.  Discussed some memory concerns last visit, 6 CIT was reassuring, referred to neurology. Mood still doing ok. No further memory episodes.     01/21/2023   10:49 AM 07/26/2022    1:57 PM 07/18/2022   10:16 AM 01/11/2022   10:57 AM 07/14/2021    8:13 AM  Depression screen PHQ 2/9  Decreased Interest 0 0 0 0 0  Down, Depressed, Hopeless 0 0 0 0 0  PHQ - 2 Score 0 0 0 0 0  Altered sleeping 2 3 3 3 3   Tired, decreased energy 2 1 1  0 0  Change in appetite 1 0 1 0 0  Feeling bad or failure about yourself  0 0 0 0 0  Trouble concentrating  1 1 0 0 3  Moving slowly or fidgety/restless 0 0 0 0 0  Suicidal thoughts 0 0 0 0 0  PHQ-9 Score 6 5 5 3 6   Difficult doing work/chores Not difficult at all  Not difficult at all         History Patient Active Problem List   Diagnosis Date Noted   Complete rotator cuff tear of left shoulder 12/15/2022   History of cataract surgery, left 10/23/2022   Acquired trigger finger of right ring finger 06/22/2022   Pain in finger of right hand 06/22/2022   Cataract 06/11/2022   Lumbar radiculopathy 03/16/2022   History of repair of retinal tear by laser photocoagulation 12/21/2021   Cough, persistent 07/10/2021   Globus pharyngeus 07/10/2021   chronic diastolic chf 05/02/2021   Acquired trigger finger of right middle finger 07/14/2020   Squamous cell carcinoma of skin of arm 10/08/2019   Squamous cell carcinoma in situ (SCCIS) of skin 09/18/2019   Double vision 07/12/2019   Age-related vocal fold atrophy 04/30/2019   Muscle tension dysphonia 04/30/2019   Dysphagia 03/04/2019   Hoarseness 01/16/2019   Acquired trigger finger of right index finger 11/04/2018   Trigger finger 09/10/2018   Cervical  vertebral fusion 02/14/2018   Cervical post-laminectomy syndrome 11/20/2017   DDD (degenerative disc disease), cervical 11/20/2017   Neck pain 11/20/2017   OA (osteoarthritis) of knee 06/17/2017   History of total knee replacement 06/17/2017   Trochanteric bursitis of left hip 07/03/2015   Routine general medical examination at a health care facility 11/18/2014   CARCINOMA, SKIN, SQUAMOUS CELL 07/06/2010   ACTINIC KERATOSIS 07/06/2010   NEOPLASM, MALIGNANT, BASAL CELL, CARCINOMA, HX OF 07/06/2010   Hyperlipidemia 06/08/2009   DEPRESSION 06/08/2009   POSTMENOPAUSAL STATUS 06/08/2009   IRRITABLE BOWEL SYNDROME 02/17/2008   BACK PAIN, CHRONIC 02/17/2008   MIGRAINE HEADACHE 02/05/2007   Essential hypertension 02/05/2007   Past Medical History:  Diagnosis Date   Arthritis    knees,  hands   Bursitis of left hip    Cancer (HCC)    hx skin cancer   Cataract    Phreesia 09/11/2020   Depression    Depression    Phreesia 09/11/2020   GERD (gastroesophageal reflux disease)    INFREQUENT   Headache    HX MIGRAINES    Heart murmur     MVP 30 yrs ago - no treatment needed   Hyperlipidemia    on statin   Hypertension    "flucuations" has never been on any med   Insomnia    Irritable bowel syndrome    s/p subtotal colectomy '86>diarrhea prone   NEOPLASM, MALIGNANT, BASAL CELL, CARCINOMA, HX OF    Seasonal allergies    Past Surgical History:  Procedure Laterality Date   ABDOMINOPLASTY     ANTERIOR CERVICAL DECOMP/DISCECTOMY FUSION N/A 02/14/2018   Procedure: CERVICAL THREE-FOUR,CERVICAL FOUR-FIVE ACDF, REMOVAL CERVICAL FIVE-SEVEN  PLATE.;  Surgeon: Tia Alert, MD;  Location: [facility] OR;  Service: Neurosurgery;  Laterality: N/A;  anterior   BACK SURGERY  1999   lower back   BREAST ENHANCEMENT SURGERY  1983   BREAST IMPLANT REMOVAL  08/2010   BREAST SURGERY     Augmentation in 1983, Reduction 2011   BUNIONECTOMY     right  and left foot   CERVICAL SPINE SURGERY  12/18/1999   C5/6 and C6/7   CHOLECYSTECTOMY N/A    Phreesia 09/11/2020   COLECTOMY  1986   COLON SURGERY N/A    Phreesia 09/11/2020   COSMETIC SURGERY     EXCISION/RELEASE BURSA HIP Left 07/04/2015   Procedure: LEFT HIP BURSECTOMY WITH ;  Surgeon: Ollen Gross, MD;  Location: [facility] ORS;  Service: Orthopedics;  Laterality: Left;   EYE SURGERY     Lasik 2010   eyelid surgery     FRACTURE SURGERY     HYSTEROSCOPY WITH D & C  08/17/2011   Procedure: DILATATION AND CURETTAGE (D&C) /HYSTEROSCOPY;  Surgeon: Meriel Pica;  Location: [facility] OR[facility];  Service: Gynecology;  Laterality: N/A;   INJECTION KNEE Right 07/04/2015   Procedure: CORTISONE INJECTION RIGHT KNEE ;  Surgeon: Ollen Gross, MD;  Location: [facility];  Service: Orthopedics;  Laterality: Right;   JOINT REPLACEMENT N/A    Phreesia 09/11/2020    KNEE ARTHROSCOPY     right x 2   KNEE RECONSTRUCTION, MEDIAL PATELLAR FEMORAL LIGAMENT     right   LASIK  2009    bilateral   left foot surgery     removed foreign object   OPEN SURGICAL REPAIR OF GLUTEAL TENDON Left 07/04/2015   Procedure: GLUTEAL TENDON REPAIR ;  Surgeon: Ollen Gross, MD;  Location: [facility];  Service: Orthopedics;  Laterality: Left;  REDUCTION MAMMAPLASTY Bilateral    SPINE SURGERY N/A    Phreesia 09/11/2020   thumb surgery     TOTAL KNEE ARTHROPLASTY Right 06/17/2017   Procedure: RIGHT TOTAL KNEE ARTHROPLASTY;  Surgeon: Ollen Gross, MD;  Location: [facility];  Service: Orthopedics;  Laterality: Right;   TRIGGER FINGER RELEASE Right 09/11/2021   TUBAL LIGATION     Allergies  Allergen Reactions   Lactose Intolerance (Gi) Diarrhea   Nsaids Other (See Comments)    Hurts stomach   Other Rash    Retinol cream   Retinoids Hives, Itching and Rash   Prior to Admission medications   Medication Sig Start Date End Date Taking? Authorizing Provider  acetaminophen (TYLENOL) 500 MG tablet Take 1,000 mg by mouth every 8 (eight) hours as needed for moderate pain or headache.    Yes [provider]  atorvastatin (LIPITOR) 20 MG tablet Take 1 tablet (20 mg total) by mouth every morning. 07/26/22  Yes Shade Flood, MD  benzonatate (TESSALON) 100 MG capsule Take by mouth. 05/01/21  Yes [provider]  buPROPion (WELLBUTRIN XL) 300 MG 24 hr tablet Take 1 tablet (300 mg total) by mouth daily. 07/26/22  Yes Shade Flood, MD  cholecalciferol (VITAMIN D3) 25 MCG (1000 UNIT) tablet Take by mouth. 03/04/19  Yes [provider]  Cyanocobalamin (VITAMIN B12) 1000 MCG TBCR Take 1,000 mcg by mouth daily.   Yes [provider]  diclofenac sodium (VOLTAREN) 1 % GEL Apply 1 g topically daily as needed.    Yes [provider]  fluorouracil (EFUDEX) 5 % cream fluorouracil 5 % topical cream 03/04/19  Yes [provider]  fluticasone  (FLONASE) 50 MCG/ACT nasal spray  07/10/21  Yes [provider]  furosemide (LASIX) 40 MG tablet TAKE 1 TABLET DAILY or as advised by cardioloy 07/26/22  Yes Shade Flood, MD  HYDROcodone-acetaminophen (NORCO/VICODIN) 5-325 MG tablet Take 1 tablet by mouth every 6 (six) hours as needed. 11/08/20  Yes [provider]  Liniments (SALONPAS PAIN RELIEF PATCH EX) Apply 2 patches topically daily.   Yes [provider]  loperamide (IMODIUM A-D) 2 MG tablet Take 1 tablet (2 mg total) by mouth 3 (three) times daily as needed for diarrhea or loose stools. 04/13/19  Yes Stallings, Zoe A, MD  loratadine (CLARITIN) 10 MG tablet Take 10 mg by mouth daily.   Yes [provider]  MAGNESIUM GLYCINATE PO Take 450 mg by mouth.   Yes [provider]  niacinamide 500 MG tablet Take 500 mg by mouth 2 (two) times daily with a meal.   Yes [provider]  potassium chloride (KLOR-CON) 10 MEQ tablet Take 1 tablet (10 mEq total) by mouth daily. Please call [phone] to schedule an appointment for a future refills. 2nd attempt. 01/21/23  Yes Nahser, Deloris Ping, MD  promethazine (PHENERGAN) 25 MG tablet Take 1 tablet (25 mg total) by mouth every 6 (six) hours as needed for nausea or vomiting. 01/11/22  Yes Shade Flood, MD  silver sulfADIAZINE (SILVADENE) 1 % cream SSD 1 % topical cream 10/17/18  Yes [provider]  Simethicone (PHAZYME PO) Take 1 tablet by mouth every morning.    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  Retired.  Education some college.  2 children.  Caffeine 3-4 glasses of tea/ day.   Social Determinants of Health   Financial Resource Strain: Low Risk  (01/17/2023)   Overall Financial Resource Strain (CARDIA)    Difficulty of Paying Living Expenses: Not hard at all  Food Insecurity: No Food Insecurity (01/17/2023)   Hunger Vital Sign    Worried About Running Out of Food in the Last Year: Never true    Ran Out of Food in the Last Year: Never true  Transportation Needs: No Transportation Needs (01/17/2023)   PRAPARE - Administrator, Civil Service (Medical): No    Lack of Transportation (Non-Medical): No  Physical Activity: Unknown (01/17/2023)   Exercise Vital Sign    Days of Exercise per Week: Patient declined    Minutes of Exercise per Session: 60 min  Stress: No Stress Concern Present (01/17/2023)   [employer] of Occupational Health - Occupational Stress Questionnaire    Feeling of Stress : Not at all  Social Connections: Socially Integrated (01/17/2023)   Social Connection and Isolation Panel [NHANES]    Frequency of Communication with Friends and Family: Once a week    Frequency of Social Gatherings with Friends and Family: Three times a week    Attends Religious Services: 1 to 4 times per year    Active Member of Clubs or Organizations: Yes    Attends Banker Meetings: More than 4 times per year    Marital Status: Married  Catering manager Violence: Not At Risk (07/18/2022)   Humiliation, Afraid, Rape, and Kick questionnaire    Fear of Current or Ex-Partner: No    Emotionally Abused: No    Physically Abused: No    Sexually Abused: No    Review of  Systems   Objective:   Vitals:   01/21/23 1045  BP: 128/78  Pulse: 74  Resp: 16  Temp: 98 F (36.7 C)  TempSrc: Oral  SpO2: 97%  Weight: 162 lb 3.2 oz (73.6 kg)  Height: 5\' 6"  (1.676 m)     Physical Exam Vitals reviewed.  Constitutional:      Appearance: Normal appearance. She is well-developed.  HENT:     Head: Normocephalic and atraumatic.  Eyes:     Conjunctiva/sclera: Conjunctivae normal.     Pupils: Pupils are equal, round, and reactive to light.  Neck:     Vascular: No carotid bruit.  Cardiovascular:     Rate and Rhythm: Normal rate and regular rhythm.     Heart sounds: Normal heart sounds.  Pulmonary:     Effort: Pulmonary effort is normal.     Breath sounds: Normal breath sounds.  Abdominal:     Palpations: Abdomen is soft. There is no pulsatile mass.     Tenderness: There is no abdominal tenderness.  Musculoskeletal:  Right lower leg: No edema.     Left lower leg: No edema.     Comments: Right leg, pain-free knee range of motion, pain-free hip range of motion, no focal bony tenderness of thigh or knee.  Firm area lateral thigh with faded ecchymosis.  Tender to palpation.  See photo.  Left shoulder, discomfort anteriorly, laterally, discomfort with internal rotation testing and abduction.  Skin:    General: Skin is warm and dry.  Neurological:     Mental Status: She is alert and oriented to person, place, and time.  Psychiatric:        Mood and Affect: Mood normal.        Behavior: Behavior normal.         Assessment & Plan:  Dorianne Pettee is a 72 y.o. female . Contusion of right thigh, initial encounter Hematoma of right thigh, initial encounter  -Contusion 10 days ago with likely secondary hematoma.  On testing she has full strength with knee extension, flexion, and abduction of hip.  Pain-free hip and knee motion.  No focal tenderness of the femur.  Option of imaging but unlikely fracture, and no pain with weightbearing.   Symptomatic care discussed with expectant management for likely underlying hematoma.  Can also discuss with orthopedics at her visit today.  Acute pain of left shoulder  -Left shoulder pain preceding recent fall, possible rotator cuff tendinosis versus injury, and secondary pain with recent fall and reaching.  Ortho eval this afternoon as planned . Essential hypertension - Plan: Comprehensive metabolic panel  -Stable with current regimen, diastolic CHF stable with intermittent furosemide, check labs, continue same regimen.  Depression, recurrent (HCC)  -Stable on Wellbutrin, continue same, 52-month follow-up.  Denies recent memory issues that were concerning last visit.  Monitor for changes, hold on neuro eval at this time.  Mixed hyperlipidemia - Plan: Comprehensive metabolic panel, Lipid panel  -Tolerating current regimen, check updated labs with plan adjustment accordingly.  Continue same dose Lipitor for now.  No orders of the defined types were placed in this encounter.  Patient Instructions  Thanks for coming today.  No medication changes at this time.  I am sorry to hear about your fall.  Keep follow-up with orthopedics as planned this afternoon.  The area on the outside of your thigh does appear to be a contusion, see information below.  If you have any worsening pain or pain with weightbearing I do recommend imaging or x-rays.  This can also be discussed with orthopedics visit today.  Follow-up with me in 6 months sooner if any new or worsening symptoms.  Hang in there.    Contusion A contusion is a deep bruise. Contusions are the result of a blunt injury to tissues and muscle fibers under the skin. The injury causes bleeding under the skin. The skin over the contusion may turn blue, purple, or yellow. Minor injuries will give you a painless contusion, but more severe injuries cause contusions that can stay painful and swollen for a few weeks. Follow these instructions at home: Pay  attention to any changes in your symptoms. Let your health care provider know about them. Take these actions to relieve your pain. Managing pain, stiffness, and swelling  Use resting, icing, applying pressure (compression), and raising (elevating) the injured area. This is often called the RICE method. Rest the injured area. Return to your normal activities as told by your health care provider. Ask your health care provider what activities are safe for you.  If directed, put ice on the injured area. To do this: Put ice in a plastic bag. Place a towel between your skin and the bag. Leave the ice on for 20 minutes, 2-3 times a day. If your skin turns bright red, remove the ice right away to prevent skin damage. The risk of skin damage is higher if you cannot feel pain, heat, or cold. If directed, apply light compression to the injured area using an elastic bandage. Make sure the bandage is not wrapped too tightly. Remove and reapply the bandage as directed by your health care provider. If possible, elevate the injured area above the level of your heart while you are sitting or lying down. General instructions Take over-the-counter and prescription medicines only as told by your health care provider. Keep all follow-up visits. Your health care provider may want to see how your contusion is healing with treatment. Contact a health care provider if: Your symptoms do not improve after several days of treatment. Your symptoms get worse. You have difficulty moving the injured area. Get help right away if: You have severe pain. You have numbness in a hand or foot. Your hand or foot turns pale or cold. This information is not intended to replace advice given to you by your health care provider. Make sure you discuss any questions you have with your health care provider. Document Revised: 02/12/2022 Document Reviewed: 02/12/2022 Elsevier Patient Education  2023 [employer].     Signed,    Meredith Staggers, MD [facility], [facility] 01/21/23 11:50 AM

## 2023-01-31 DIAGNOSIS — M25512 Pain in left shoulder: Secondary | ICD-10-CM | POA: Insufficient documentation

## 2023-01-31 DIAGNOSIS — M65341 Trigger finger, right ring finger: Secondary | ICD-10-CM | POA: Diagnosis not present

## 2023-02-02 DIAGNOSIS — M25552 Pain in left hip: Secondary | ICD-10-CM | POA: Diagnosis not present

## 2023-02-02 DIAGNOSIS — M25512 Pain in left shoulder: Secondary | ICD-10-CM | POA: Diagnosis not present

## 2023-02-05 DIAGNOSIS — M25552 Pain in left hip: Secondary | ICD-10-CM | POA: Diagnosis not present

## 2023-02-11 DIAGNOSIS — M7542 Impingement syndrome of left shoulder: Secondary | ICD-10-CM | POA: Diagnosis not present

## 2023-02-18 DIAGNOSIS — M25552 Pain in left hip: Secondary | ICD-10-CM | POA: Diagnosis not present

## 2023-03-06 DIAGNOSIS — M7062 Trochanteric bursitis, left hip: Secondary | ICD-10-CM | POA: Diagnosis not present

## 2023-03-08 DIAGNOSIS — G8918 Other acute postprocedural pain: Secondary | ICD-10-CM | POA: Diagnosis not present

## 2023-03-08 DIAGNOSIS — M7542 Impingement syndrome of left shoulder: Secondary | ICD-10-CM | POA: Diagnosis not present

## 2023-03-08 DIAGNOSIS — M659 Synovitis and tenosynovitis, unspecified: Secondary | ICD-10-CM | POA: Diagnosis not present

## 2023-03-08 DIAGNOSIS — M7532 Calcific tendinitis of left shoulder: Secondary | ICD-10-CM | POA: Diagnosis not present

## 2023-03-08 DIAGNOSIS — M94262 Chondromalacia, left knee: Secondary | ICD-10-CM | POA: Diagnosis not present

## 2023-03-08 DIAGNOSIS — M75112 Incomplete rotator cuff tear or rupture of left shoulder, not specified as traumatic: Secondary | ICD-10-CM | POA: Diagnosis not present

## 2023-03-08 DIAGNOSIS — M24112 Other articular cartilage disorders, left shoulder: Secondary | ICD-10-CM | POA: Diagnosis not present

## 2023-03-08 DIAGNOSIS — M11212 Other chondrocalcinosis, left shoulder: Secondary | ICD-10-CM | POA: Diagnosis not present

## 2023-03-08 DIAGNOSIS — M94212 Chondromalacia, left shoulder: Secondary | ICD-10-CM | POA: Diagnosis not present

## 2023-03-08 DIAGNOSIS — M19012 Primary osteoarthritis, left shoulder: Secondary | ICD-10-CM | POA: Diagnosis not present

## 2023-03-08 DIAGNOSIS — S43432A Superior glenoid labrum lesion of left shoulder, initial encounter: Secondary | ICD-10-CM | POA: Diagnosis not present

## 2023-03-08 DIAGNOSIS — M7582 Other shoulder lesions, left shoulder: Secondary | ICD-10-CM | POA: Diagnosis not present

## 2023-03-21 DIAGNOSIS — M25512 Pain in left shoulder: Secondary | ICD-10-CM | POA: Diagnosis not present

## 2023-03-26 DIAGNOSIS — M25512 Pain in left shoulder: Secondary | ICD-10-CM | POA: Diagnosis not present

## 2023-03-28 DIAGNOSIS — M25512 Pain in left shoulder: Secondary | ICD-10-CM | POA: Diagnosis not present

## 2023-04-01 DIAGNOSIS — M25512 Pain in left shoulder: Secondary | ICD-10-CM | POA: Diagnosis not present

## 2023-04-10 DIAGNOSIS — L578 Other skin changes due to chronic exposure to nonionizing radiation: Secondary | ICD-10-CM | POA: Diagnosis not present

## 2023-04-10 DIAGNOSIS — L821 Other seborrheic keratosis: Secondary | ICD-10-CM | POA: Diagnosis not present

## 2023-04-10 DIAGNOSIS — L570 Actinic keratosis: Secondary | ICD-10-CM | POA: Diagnosis not present

## 2023-04-10 DIAGNOSIS — Z86018 Personal history of other benign neoplasm: Secondary | ICD-10-CM | POA: Diagnosis not present

## 2023-04-10 DIAGNOSIS — D225 Melanocytic nevi of trunk: Secondary | ICD-10-CM | POA: Diagnosis not present

## 2023-04-10 DIAGNOSIS — Z85828 Personal history of other malignant neoplasm of skin: Secondary | ICD-10-CM | POA: Diagnosis not present

## 2023-04-10 DIAGNOSIS — C44729 Squamous cell carcinoma of skin of left lower limb, including hip: Secondary | ICD-10-CM | POA: Diagnosis not present

## 2023-04-10 DIAGNOSIS — D485 Neoplasm of uncertain behavior of skin: Secondary | ICD-10-CM | POA: Diagnosis not present

## 2023-04-10 DIAGNOSIS — L814 Other melanin hyperpigmentation: Secondary | ICD-10-CM | POA: Diagnosis not present

## 2023-04-11 DIAGNOSIS — M25512 Pain in left shoulder: Secondary | ICD-10-CM | POA: Diagnosis not present

## 2023-04-19 DIAGNOSIS — M25512 Pain in left shoulder: Secondary | ICD-10-CM | POA: Diagnosis not present

## 2023-05-25 ENCOUNTER — Other Ambulatory Visit: Payer: Self-pay | Admitting: Cardiovascular Disease

## 2023-06-12 DIAGNOSIS — M25552 Pain in left hip: Secondary | ICD-10-CM | POA: Diagnosis not present

## 2023-07-03 ENCOUNTER — Ambulatory Visit: Payer: Medicare Other | Admitting: Family Medicine

## 2023-07-03 ENCOUNTER — Encounter: Payer: Self-pay | Admitting: Family Medicine

## 2023-07-03 VITALS — BP 128/82 | HR 82 | Temp 98.2°F | Ht 66.0 in | Wt 165.0 lb

## 2023-07-03 DIAGNOSIS — L02416 Cutaneous abscess of left lower limb: Secondary | ICD-10-CM

## 2023-07-03 MED ORDER — DOXYCYCLINE HYCLATE 100 MG PO TABS
100.0000 mg | ORAL_TABLET | Freq: Two times a day (BID) | ORAL | 0 refills | Status: DC
Start: 2023-07-03 — End: 2023-08-01

## 2023-07-03 NOTE — Progress Notes (Signed)
Subjective:  Patient ID: [name], female    DOB: October 07, 1950  Age: 72 y.o. MRN: [id number]  CC:  Chief Complaint  Patient presents with   Insect Bite    Pt reports infection on her Lt shin notes started about 1 week ago, has redness swelling and pus, notes it is hot to the touch     HPI Ermal Lom presents for   Left lower leg lesion Had been playing golf in [place]. No known injury or known bite, but suspected spider or other insect bite. Small reddened area on left shin on 10/18. Small head formed on area next day. White head. Some increased swelling 3 days ago. About the same - slight better 2 days ago, then maybe a little worse past 2 days. No itci  No fever, no rash or other lesions. Feels well otherwise.   Tx:  one application of Efudecx - has used on other areas for skin CA - no repeat application.  History Patient Active Problem List   Diagnosis Date Noted   Complete rotator cuff tear of left shoulder 12/15/2022   History of cataract surgery, left 10/23/2022   Acquired trigger finger of right ring finger 06/22/2022   Pain in finger of right hand 06/22/2022   Cataract 06/11/2022   Lumbar radiculopathy 03/16/2022   History of repair of retinal tear by laser photocoagulation 12/21/2021   Cough, persistent 07/10/2021   Globus pharyngeus 07/10/2021   chronic diastolic chf 05/02/2021   Acquired trigger finger of right middle finger 07/14/2020   Squamous cell carcinoma of skin of arm 10/08/2019   Squamous cell carcinoma in situ (SCCIS) of skin 09/18/2019   Double vision 07/12/2019   Age-related vocal fold atrophy 04/30/2019   Muscle tension dysphonia 04/30/2019   Dysphagia 03/04/2019   Hoarseness 01/16/2019   Acquired trigger finger of right index finger 11/04/2018   Trigger finger 09/10/2018   Cervical vertebral fusion 02/14/2018   Cervical post-laminectomy syndrome 11/20/2017   DDD (degenerative disc disease), cervical 11/20/2017   Neck pain  11/20/2017   OA (osteoarthritis) of knee 06/17/2017   History of total knee replacement 06/17/2017   Trochanteric bursitis of left hip 07/03/2015   Routine general medical examination at a health care facility 11/18/2014   CARCINOMA, SKIN, SQUAMOUS CELL 07/06/2010   ACTINIC KERATOSIS 07/06/2010   NEOPLASM, MALIGNANT, BASAL CELL, CARCINOMA, HX OF 07/06/2010   Hyperlipidemia 06/08/2009   DEPRESSION 06/08/2009   POSTMENOPAUSAL STATUS 06/08/2009   IRRITABLE BOWEL SYNDROME 02/17/2008   Backache 02/17/2008   Migraine headache 02/05/2007   Essential hypertension 02/05/2007   Past Medical History:  Diagnosis Date   Arthritis    knees, hands   Bursitis of left hip    Cancer (HCC)    hx skin cancer   Cataract    Phreesia 09/11/2020   Depression    Depression    [employer]reesia 09/11/2020   GERD (gastroesophageal reflux disease)    INFREQUENT   Headache    HX MIGRAINES    Heart murmur     MVP 30 yrs ago - no treatment needed   Hyperlipidemia    on statin   Hypertension    "flucuations" has never been on any med   Insomnia    Irritable bowel syndrome    s/p subtotal colectomy '86>diarrhea prone   NEOPLASM, MALIGNANT, BASAL CELL, CARCINOMA, HX OF    Seasonal allergies    Past Surgical History:  Procedure Laterality Date   ABDOMINOPLASTY  ANTERIOR CERVICAL DECOMP/DISCECTOMY FUSION N/A 02/14/2018   Procedure: CERVICAL THREE-FOUR,CERVICAL FOUR-FIVE ACDF, REMOVAL CERVICAL FIVE-SEVEN  PLATE.;  Surgeon: Tia Alert, MD;  Location: [facility] OR;  Service: Neurosurgery;  Laterality: N/A;  anterior   BACK SURGERY  1999   lower back   BREAST ENHANCEMENT SURGERY  1983   BREAST IMPLANT REMOVAL  08/2010   BREAST SURGERY     Augmentation in 1983, Reduction 2011   BUNIONECTOMY     right  and left foot   CERVICAL SPINE SURGERY  12/18/1999   C5/6 and C6/7   CHOLECYSTECTOMY N/A    Phreesia 09/11/2020   COLECTOMY  1986   COLON SURGERY N/A    Phreesia 09/11/2020   COSMETIC SURGERY      EXCISION/RELEASE BURSA HIP Left 07/04/2015   Procedure: LEFT HIP BURSECTOMY WITH ;  Surgeon: Ollen Gross, MD;  Location: [facility] ORS;  Service: Orthopedics;  Laterality: Left;   EYE SURGERY     Lasik 2010   eyelid surgery     FRACTURE SURGERY     HYSTEROSCOPY WITH D & C  08/17/2011   Procedure: DILATATION AND CURETTAGE (D&C) /HYSTEROSCOPY;  Surgeon: Meriel Pica;  Location: [facility] ORS;  Service: Gynecology;  Laterality: N/A;   INJECTION KNEE Right 07/04/2015   Procedure: CORTISONE INJECTION RIGHT KNEE ;  Surgeon: Ollen Gross, MD;  Location: [facility] OR[facility];  Service: Orthopedics;  Laterality: Right;   JOINT REPLACEMENT N/A    Phreesia 09/11/2020   KNEE ARTHROSCOPY     right x 2   KNEE RECONSTRUCTION, MEDIAL PATELLAR FEMORAL LIGAMENT     right   LASIK  2009    bilateral   left foot surgery     removed foreign object   OPEN SURGICAL REPAIR OF GLUTEAL TENDON Left 07/04/2015   Procedure: GLUTEAL TENDON REPAIR ;  Surgeon: Ollen Gross, MD;  Location: [facility] OR[facility];  Service: Orthopedics;  Laterality: Left;   REDUCTION MAMMAPLASTY Bilateral    SPINE SURGERY N/A    Phreesia 09/11/2020   thumb surgery     TOTAL KNEE ARTHROPLASTY Right 06/17/2017   Procedure: RIGHT TOTAL KNEE ARTHROPLASTY;  Surgeon: Ollen Gross, MD;  Location: [facility] OR[facility];  Service: Orthopedics;  Laterality: Right;   TRIGGER FINGER RELEASE Right 09/11/2021   TUBAL LIGATION     Allergies  Allergen Reactions   Lactose Intolerance (Gi) Diarrhea   Nsaids Other (See Comments)    Hurts stomach   Other Rash    Retinol cream   Retinoids Hives, Itching and Rash   Prior to Admission medications   Medication Sig Start Date End Date Taking? Authorizing Provider  acetaminophen (TYLENOL) 500 MG tablet Take 1,000 mg by mouth every 8 (eight) hours as needed for moderate pain or headache.    Yes [provider]  atorvastatin (LIPITOR) 20 MG tablet Take 1 tablet (20 mg total) by mouth every morning. 07/26/22  Yes Shade Flood, MD   buPROPion (WELLBUTRIN XL) 300 MG 24 hr tablet Take 1 tablet (300 mg total) by mouth daily. 07/26/22  Yes Shade Flood, MD  cholecalciferol (VITAMIN D3) 25 MCG (1000 UNIT) tablet Take by mouth. 03/04/19  Yes [provider]  Cyanocobalamin (VITAMIN B12) 1000 MCG TBCR Take 1,000 mcg by mouth daily.   Yes [provider]  diclofenac sodium (VOLTAREN) 1 % GEL Apply 1 g topically daily as needed.    Yes [provider]  fluorouracil (EFUDEX) 5 % cream fluorouracil 5 % topical cream 03/04/19  Yes [provider]  fluticasone (FLONASE) 50 MCG/ACT nasal spray  07/10/21  Yes [provider]  furosemide (LASIX) 40 MG tablet TAKE 1 TABLET DAILY or as advised by cardioloy 07/26/22  Yes Shade Flood, MD  HYDROcodone-acetaminophen (NORCO/VICODIN) 5-325 MG tablet Take 1 tablet by mouth every 6 (six) hours as needed. 11/08/20  Yes [provider]  Liniments (SALONPAS PAIN RELIEF PATCH EX) Apply 2 patches topically daily.   Yes [provider]  loperamide (IMODIUM A-D) 2 MG tablet Take 1 tablet (2 mg total) by mouth 3 (three) times daily as needed for diarrhea or loose stools. 04/13/19  Yes Stallings, Zoe A, MD  loratadine (CLARITIN) 10 MG tablet Take 10 mg by mouth daily.   Yes [provider]  MAGNESIUM GLYCINATE PO Take 450 mg by mouth.   Yes [provider]  niacinamide 500 MG tablet Take 500 mg by mouth 2 (two) times daily with a meal.   Yes [provider]  potassium chloride (KLOR-CON) 10 MEQ tablet TAKE 1 TABLET DAILY (PLEASE CALL [phone] TO SCHEDULE AN APPOINTMENT FOR FUTURE REFILLS, SECOND ATTEMPT) 05/27/23  Yes Nahser, Deloris Ping, MD  promethazine (PHENERGAN) 25 MG tablet Take 1 tablet (25 mg total) by mouth every 6 (six) hours as needed for nausea or vomiting. 01/11/22  Yes Shade Flood, MD  Simethicone (PHAZYME PO) Take 1 tablet by mouth every morning.    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  Retired.  Education some college.  2 children.  Caffeine 3-4 glasses of tea/ day.   Social Determinants of Health   Financial Resource Strain: Low Risk  (01/17/2023)   Overall Financial Resource Strain (CARDIA)    Difficulty of Paying Living Expenses: Not hard at all  Food Insecurity: No Food Insecurity (01/17/2023)   Hunger Vital Sign    Worried About Running Out of Food in the Last Year: Never true    Ran Out of Food in the Last Year: Never true  Transportation Needs: No Transportation Needs (01/17/2023)   PRAPARE - Administrator, Civil Service (Medical): No    Lack of Transportation (Non-Medical): No  Physical Activity: Unknown (01/17/2023)   Exercise Vital Sign    Days of Exercise per Week: Patient declined    Minutes of Exercise per Session: 60 min  Stress: No Stress Concern Present (01/17/2023)   [employer] of Occupational Health - Occupational Stress Questionnaire    Feeling of Stress : Not at all  Social Connections: Socially Integrated (01/17/2023)   Social Connection and Isolation Panel [NHANES]    Frequency of Communication with Friends and Family:  Once a week    Frequency of Social Gatherings with Friends and Family: Three times a  week    Attends Religious Services: 1 to 4 times per year    Active Member of Clubs or Organizations: Yes    Attends Banker Meetings: More than 4 times per year    Marital Status: Married  Catering manager Violence: Not At Risk (07/18/2022)   Humiliation, Afraid, Rape, and Kick questionnaire    Fear of Current or Ex-Partner: No    Emotionally Abused: No    Physically Abused: No    Sexually Abused: No    Review of Systems Per HPI.   Objective:   Vitals:   07/03/23 1123  BP: 128/82  Pulse: 82  Temp: 98.2 F (36.8 C)  TempSrc: Temporal  SpO2: 97%  Weight: 165 lb (74.8 kg)  Height: 5\' 6"  (1.676 m)     Physical Exam Constitutional:      General: She is not in acute distress.    Appearance: Normal appearance. She is well-developed.  HENT:     Head: Normocephalic and atraumatic.  Cardiovascular:     Rate and Rhythm: Normal rate.  Pulmonary:     Effort: Pulmonary effort is normal.  Skin:    Comments: Left lower leg, see photo, approximately 3 cm of erythema with central 5 to 6 mm white fluctuant area.  No deep fluctuance, no surrounding induration.  Minimal discomfort.  No vascular streaks.  Neurological:     Mental Status: She is alert and oriented to person, place, and time.  Psychiatric:        Mood and Affect: Mood normal.      Risks (including but not limited to bleeding and infection), benefits, and alternatives discussed for abscess I and D of left lower leg.  .  Verbal consent obtained after any questions were answered. Landmarks noted, and marked as needed. Area cleansed with Betadine x3, ethyl chloride spray for topical anesthesia, then alcohol swab. Small 3mm incision superficially with #15 blade, small amount white exudate at surface, no bleeding, no complications. Bandage applied.  RTC precautions discussed.  Assessment & Plan:  Tavares Roederer is a 72 y.o. female . Abscess of left lower leg - Plan: doxycycline (VIBRA-TABS) 100 MG  tablet, WOUND CULTURE Possible initial insect bite versus folliculitis with secondary mild abscess.  No significant induration or deep involvement, did request incision and drainage of superficial abscess which was performed as above.  No complications, minimal yellow exudate that was cultured.  Started on doxycycline, wound care discussed with RTC precautions given.  Meds ordered this encounter  Medications   doxycycline (VIBRA-TABS) 100 MG tablet    Sig: Take 1 tablet (100 mg total) by mouth 2 (two) times daily.    Dispense:  14 tablet    Refill:  0   Patient Instructions  Wound of the leg does appear to be a small, superficial abscess.  Opening today should allow that to heal better along with the antibiotic.  Keep wound clean, covered and can clean gently with soap and water -avoid scrubbing on area.  If any increased redness, swelling, or worsening symptoms be seen but I do not expect that to occur.  Skin Abscess  A skin abscess is an infected area on or under your skin. It contains pus and other material. An abscess may also be called a furuncle, carbuncle, or boil. It is often the result of an infection caused by bacteria. An abscess can occur  in or on almost any part of your body. Sometimes, an abscess may break open (rupture) on its own. In most cases, it will keep getting worse unless it is treated. An abscess can cause pain and make you feel ill. An untreated abscess can cause infection to spread to other parts of your body or your bloodstream. The abscess may need to be drained. You may also need to take antibiotics. What are the causes? An abscess occurs when germs, like bacteria, pass through your skin and cause an infection. This may be caused by: A scrape or cut on your skin. A puncture wound through your skin, such as a needle injection or insect bite. Blocked oil or sweat glands. Blocked and infected hair follicles. A fluid-filled sac that forms beneath your skin (sebaceous  cyst) and becomes infected. What increases the risk? You may be more likely to develop an abscess if: You have problems with blood circulation, or you have a weak body defense system (immune system). You have diabetes. You have dry and irritated skin. You get injections often or use IV drugs. You have a foreign body in a wound, such as a splinter. You smoke or use tobacco products. What are the signs or symptoms? Symptoms of this condition include: A painful, firm bump under the skin. A bump with pus at the top. This may break through the skin and drain. Other symptoms include: Redness and swelling around the abscess. Warmth or tenderness. Swelling of the lymph nodes (glands) near the abscess. A sore on the skin. How is this diagnosed? This condition may be diagnosed based on a physical exam and your medical history. You may also have tests done, such as: A test of a sample of pus. This may be done to find what is causing the infection. Blood tests. Imaging tests, such as an ultrasound, CT scan, or MRI. How is this treated? A small abscess that drains on its own may not need to be treated. Treatment for larger abscesses may include: Moist heat or a heat pack applied to the area a few times a day. Incision and drainage. This is a procedure to drain the abscess. Antibiotics. For a severe abscess, you may first get antibiotics through an IV and then change to antibiotics by mouth. Follow these instructions at home: Medicines Take over-the-counter and prescription medicines only as told by your provider. If you were prescribed antibiotics, take them as told by your provider. Do not stop using the antibiotic even if you start to feel better. Abscess care  If you have an abscess that has not drained, apply heat to the affected area. Use the heat source that your provider recommends, such as a moist heat pack or a heating pad. Place a towel between your skin and the heat source. Leave  the heat on for 20-30 minutes at a time. If your skin turns bright red, remove the heat right away to prevent burns. The risk of burns is higher if you cannot feel pain, heat, or cold. Follow instructions from your provider about how to take care of your abscess. Make sure you: Cover the abscess with a bandage (dressing). Wash your hands with soap and water for at least 20 seconds before and after you change the dressing or gauze. If soap and water are not available, use hand sanitizer. Change your dressing or gauze as told by your provider. Check your abscess every day for signs of an infection that is getting worse. Check for: More redness,  swelling, pain, or tenderness. More fluid or blood. Warmth. More pus or a worse smell. General instructions To avoid spreading the infection: Do not share personal care items, towels, or hot tubs with others. Avoid making skin contact with other people. Be careful when getting rid of used dressings, wound packing, or any drainage from the abscess. Do not use any products that contain nicotine or tobacco. These products include cigarettes, chewing tobacco, and vaping devices, such as e-cigarettes. If you need help quitting, ask your provider. Do not use any creams, ointments, or liquids unless you have been told to by your provider. Contact a health care provider if: You see redness that spreads quickly or red streaks on your skin spreading away from the abscess. You have any signs of worse infection at the abscess. You vomit every time you eat or drink. You have a fever, chills, or muscle aches. The cyst or abscess returns. Get help right away if: You have severe pain. You make less pee (urine) than normal. This information is not intended to replace advice given to you by your health care provider. Make sure you discuss any questions you have with your health care provider. Document Revised: 04/11/2022 Document Reviewed: 04/11/2022 Elsevier Patient  Education  2024 Else[employer] Inc.     Signed,   Meredith Staggers, MD [facility], [facility] 07/03/23 12:12 PM

## 2023-07-03 NOTE — Patient Instructions (Signed)
Wound of the leg does appear to be a small, superficial abscess.  Opening today should allow that to heal better along with the antibiotic.  Keep wound clean, covered and can clean gently with soap and water -avoid scrubbing on area.  If any increased redness, swelling, or worsening symptoms be seen but I do not expect that to occur.  Skin Abscess  A skin abscess is an infected area on or under your skin. It contains pus and other material. An abscess may also be called a furuncle, carbuncle, or boil. It is often the result of an infection caused by bacteria. An abscess can occur in or on almost any part of your body. Sometimes, an abscess may break open (rupture) on its own. In most cases, it will keep getting worse unless it is treated. An abscess can cause pain and make you feel ill. An untreated abscess can cause infection to spread to other parts of your body or your bloodstream. The abscess may need to be drained. You may also need to take antibiotics. What are the causes? An abscess occurs when germs, like bacteria, pass through your skin and cause an infection. This may be caused by: A scrape or cut on your skin. A puncture wound through your skin, such as a needle injection or insect bite. Blocked oil or sweat glands. Blocked and infected hair follicles. A fluid-filled sac that forms beneath your skin (sebaceous cyst) and becomes infected. What increases the risk? You may be more likely to develop an abscess if: You have problems with blood circulation, or you have a weak body defense system (immune system). You have diabetes. You have dry and irritated skin. You get injections often or use IV drugs. You have a foreign body in a wound, such as a splinter. You smoke or use tobacco products. What are the signs or symptoms? Symptoms of this condition include: A painful, firm bump under the skin. A bump with pus at the top. This may break through the skin and drain. Other symptoms  include: Redness and swelling around the abscess. Warmth or tenderness. Swelling of the lymph nodes (glands) near the abscess. A sore on the skin. How is this diagnosed? This condition may be diagnosed based on a physical exam and your medical history. You may also have tests done, such as: A test of a sample of pus. This may be done to find what is causing the infection. Blood tests. Imaging tests, such as an ultrasound, CT scan, or MRI. How is this treated? A small abscess that drains on its own may not need to be treated. Treatment for larger abscesses may include: Moist heat or a heat pack applied to the area a few times a day. Incision and drainage. This is a procedure to drain the abscess. Antibiotics. For a severe abscess, you may first get antibiotics through an IV and then change to antibiotics by mouth. Follow these instructions at home: Medicines Take over-the-counter and prescription medicines only as told by your provider. If you were prescribed antibiotics, take them as told by your provider. Do not stop using the antibiotic even if you start to feel better. Abscess care  If you have an abscess that has not drained, apply heat to the affected area. Use the heat source that your provider recommends, such as a moist heat pack or a heating pad. Place a towel between your skin and the heat source. Leave the heat on for 20-30 minutes at a time. If your  skin turns bright red, remove the heat right away to prevent burns. The risk of burns is higher if you cannot feel pain, heat, or cold. Follow instructions from your provider about how to take care of your abscess. Make sure you: Cover the abscess with a bandage (dressing). Wash your hands with soap and water for at least 20 seconds before and after you change the dressing or gauze. If soap and water are not available, use hand sanitizer. Change your dressing or gauze as told by your provider. Check your abscess every day for  signs of an infection that is getting worse. Check for: More redness, swelling, pain, or tenderness. More fluid or blood. Warmth. More pus or a worse smell. General instructions To avoid spreading the infection: Do not share personal care items, towels, or hot tubs with others. Avoid making skin contact with other people. Be careful when getting rid of used dressings, wound packing, or any drainage from the abscess. Do not use any products that contain nicotine or tobacco. These products include cigarettes, chewing tobacco, and vaping devices, such as e-cigarettes. If you need help quitting, ask your provider. Do not use any creams, ointments, or liquids unless you have been told to by your provider. Contact a health care provider if: You see redness that spreads quickly or red streaks on your skin spreading away from the abscess. You have any signs of worse infection at the abscess. You vomit every time you eat or drink. You have a fever, chills, or muscle aches. The cyst or abscess returns. Get help right away if: You have severe pain. You make less pee (urine) than normal. This information is not intended to replace advice given to you by your health care provider. Make sure you discuss any questions you have with your health care provider. Document Revised: 04/11/2022 Document Reviewed: 04/11/2022 Elsevier Patient Education  2024 [employer].

## 2023-07-07 LAB — WOUND CULTURE
MICRO NUMBER:: 15633215
RESULT:: NO GROWTH
SPECIMEN QUALITY:: ADEQUATE

## 2023-07-18 ENCOUNTER — Ambulatory Visit: Payer: Medicare Other | Admitting: Family Medicine

## 2023-07-30 DIAGNOSIS — Z23 Encounter for immunization: Secondary | ICD-10-CM | POA: Diagnosis not present

## 2023-08-01 ENCOUNTER — Ambulatory Visit (INDEPENDENT_AMBULATORY_CARE_PROVIDER_SITE_OTHER): Payer: Medicare Other | Admitting: Family Medicine

## 2023-08-01 ENCOUNTER — Encounter: Payer: Self-pay | Admitting: Family Medicine

## 2023-08-01 VITALS — BP 136/78 | HR 72 | Temp 98.0°F | Ht 66.0 in | Wt 165.4 lb

## 2023-08-01 DIAGNOSIS — F339 Major depressive disorder, recurrent, unspecified: Secondary | ICD-10-CM

## 2023-08-01 DIAGNOSIS — G4700 Insomnia, unspecified: Secondary | ICD-10-CM

## 2023-08-01 DIAGNOSIS — Z8669 Personal history of other diseases of the nervous system and sense organs: Secondary | ICD-10-CM | POA: Diagnosis not present

## 2023-08-01 DIAGNOSIS — G478 Other sleep disorders: Secondary | ICD-10-CM

## 2023-08-01 DIAGNOSIS — R600 Localized edema: Secondary | ICD-10-CM | POA: Diagnosis not present

## 2023-08-01 DIAGNOSIS — E782 Mixed hyperlipidemia: Secondary | ICD-10-CM | POA: Diagnosis not present

## 2023-08-01 DIAGNOSIS — R5383 Other fatigue: Secondary | ICD-10-CM

## 2023-08-01 DIAGNOSIS — Z87898 Personal history of other specified conditions: Secondary | ICD-10-CM

## 2023-08-01 MED ORDER — POTASSIUM CHLORIDE ER 10 MEQ PO TBCR: 10.0000 meq | EXTENDED_RELEASE_TABLET | Freq: Every day | ORAL | 2 refills | Status: DC | PRN

## 2023-08-01 MED ORDER — BUPROPION HCL ER (XL) 300 MG PO TB24
300.0000 mg | ORAL_TABLET | Freq: Every day | ORAL | 3 refills | Status: DC
Start: 2023-08-01 — End: 2024-02-19

## 2023-08-01 MED ORDER — HYDROXYZINE HCL 10 MG PO TABS
10.0000 mg | ORAL_TABLET | Freq: Every evening | ORAL | 0 refills | Status: DC | PRN
Start: 2023-08-01 — End: 2023-08-27

## 2023-08-01 MED ORDER — PROMETHAZINE HCL 25 MG PO TABS: 12.5000 mg | ORAL_TABLET | Freq: Four times a day (QID) | ORAL | 1 refills | Status: AC | PRN

## 2023-08-01 MED ORDER — ATORVASTATIN CALCIUM 20 MG PO TABS
20.0000 mg | ORAL_TABLET | Freq: Every morning | ORAL | 3 refills | Status: DC
Start: 2023-08-01 — End: 2024-02-19

## 2023-08-01 NOTE — Patient Instructions (Addendum)
See information below in sleep.  Okay to continue sleepy time tea.  You can try Tylenol PM or the hydroxyzine that I prescribed today, but watch for any daytime sleepiness worsening with taking that medication and if that occurs stop it.  I did refer you to a sleep specialist to evaluate for possible testing for sleep apnea.  I will also check some other labs for the fatigue, but I still think that is probably from the incomplete sleep.  Try to avoid any electronic media within 30 minutes to an hour before bedtime.  Recheck in 6 weeks, sooner if any new or worsening symptoms.  Let me know if you have questions and take care.   Insomnia Insomnia is a sleep disorder that makes it difficult to fall asleep or stay asleep. Insomnia can cause fatigue, low energy, difficulty concentrating, mood swings, and poor performance at work or school. There are three different ways to classify insomnia: Difficulty falling asleep. Difficulty staying asleep. Waking up too early in the morning. Any type of insomnia can be long-term (chronic) or short-term (acute). Both are common. Short-term insomnia usually lasts for 3 months or less. Chronic insomnia occurs at least three times a week for longer than 3 months. What are the causes? Insomnia may be caused by another condition, situation, or substance, such as: Having certain mental health conditions, such as anxiety and depression. Using caffeine, alcohol, tobacco, or drugs. Having gastrointestinal conditions, such as gastroesophageal reflux disease (GERD). Having certain medical conditions. These include: Asthma. Alzheimer's disease. Stroke. Chronic pain. An overactive thyroid gland (hyperthyroidism). Other sleep disorders, such as restless legs syndrome and sleep apnea. Menopause. Sometimes, the cause of insomnia may not be known. What increases the risk? Risk factors for insomnia include: Gender. Females are affected more often than males. Age. Insomnia is  more common as people get older. Stress and certain medical and mental health conditions. Lack of exercise. Having an irregular work schedule. This may include working night shifts and traveling between different time zones. What are the signs or symptoms? If you have insomnia, the main symptom is having trouble falling asleep or having trouble staying asleep. This may lead to other symptoms, such as: Feeling tired or having low energy. Feeling nervous about going to sleep. Not feeling rested in the morning. Having trouble concentrating. Feeling irritable, anxious, or depressed. How is this diagnosed? This condition may be diagnosed based on: Your symptoms and medical history. Your health care provider may ask about: Your sleep habits. Any medical conditions you have. Your mental health. A physical exam. How is this treated? Treatment for insomnia depends on the cause. Treatment may focus on treating an underlying condition that is causing the insomnia. Treatment may also include: Medicines to help you sleep. Counseling or therapy. Lifestyle adjustments to help you sleep better. Follow these instructions at home: Eating and drinking  Limit or avoid alcohol, caffeinated beverages, and products that contain nicotine and tobacco, especially close to bedtime. These can disrupt your sleep. Do not eat a large meal or eat spicy foods right before bedtime. This can lead to digestive discomfort that can make it hard for you to sleep. Sleep habits  Keep a sleep diary to help you and your health care provider figure out what could be causing your insomnia. Write down: When you sleep. When you wake up during the night. How well you sleep and how rested you feel the next day. Any side effects of medicines you are taking. What you eat  and drink. Make your bedroom a dark, comfortable place where it is easy to fall asleep. Put up shades or blackout curtains to block light from outside. Use a  white noise machine to block noise. Keep the temperature cool. Limit screen use before bedtime. This includes: Not watching TV. Not using your smartphone, tablet, or computer. Stick to a routine that includes going to bed and waking up at the same times every day and night. This can help you fall asleep faster. Consider making a quiet activity, such as reading, part of your nighttime routine. Try to avoid taking naps during the day so that you sleep better at night. Get out of bed if you are still awake after 15 minutes of trying to sleep. Keep the lights down, but try reading or doing a quiet activity. When you feel sleepy, go back to bed. General instructions Take over-the-counter and prescription medicines only as told by your health care provider. Exercise regularly as told by your health care provider. However, avoid exercising in the hours right before bedtime. Use relaxation techniques to manage stress. Ask your health care provider to suggest some techniques that may work well for you. These may include: Breathing exercises. Routines to release muscle tension. Visualizing peaceful scenes. Make sure that you drive carefully. Do not drive if you feel very sleepy. Keep all follow-up visits. This is important. Contact a health care provider if: You are tired throughout the day. You have trouble in your daily routine due to sleepiness. You continue to have sleep problems, or your sleep problems get worse. Get help right away if: You have thoughts about hurting yourself or someone else. Get help right away if you feel like you may hurt yourself or others, or have thoughts about taking your own life. Go to your nearest emergency room or: Call 911. Call the National Suicide Prevention Lifeline at 864-498-2264 or 988. This is open 24 hours a day. Text the Crisis Text Line at 830-139-8234. Summary Insomnia is a sleep disorder that makes it difficult to fall asleep or stay asleep. Insomnia can  be long-term (chronic) or short-term (acute). Treatment for insomnia depends on the cause. Treatment may focus on treating an underlying condition that is causing the insomnia. Keep a sleep diary to help you and your health care provider figure out what could be causing your insomnia. This information is not intended to replace advice given to you by your health care provider. Make sure you discuss any questions you have with your health care provider. Document Revised: 08/07/2021 Document Reviewed: 08/07/2021 [employer] Patient Education  2024 [employer].

## 2023-08-01 NOTE — Progress Notes (Signed)
Subjective:  Patient ID: [name], female    DOB: 06/12/1951  Age: 72 y.o. MRN: [id number]  CC:  Chief Complaint  Patient presents with   Medical Management of Chronic Issues    Pt notes she is struggling to stay asleep and notes an average of 6 hours a night and feels very tired     HPI [name] presents for follow-up  Hypertension: With chronic diastolic CHF, hypertensive heart disease followed by cardiology, Dr. Elease Hashimoto in the past - no recent visit, Lasix 40 mg 4 days/week with potassium when discussed in May. Still working ok. Needs K refill.  Home readings: BP Readings from Last 3 Encounters:  08/01/23 136/78  07/03/23 128/82  01/21/23 128/78   Lab Results  Component Value Date   CREATININE 0.80 01/21/2023   History of migraine: Chronic hx of migraine. No daily meds for prevention. Only takes phenergan once ever 3 months with early onset of HA - 1/4-1/2 pill effective for HA. No change in HA's, infrequent.   Hyperlipidemia: Lipitor 20 mg daily without any new side effects/myalgias. Followed by ortho for various arthralgias.   Lab Results  Component Value Date   CHOL 173 01/21/2023   HDL 69.30 01/21/2023   LDLCALC 75 01/21/2023   TRIG 142.0 01/21/2023   CHOLHDL 2 01/21/2023   Lab Results  Component Value Date   ALT 22 01/21/2023   AST 26 01/21/2023   ALKPHOS 62 01/21/2023   BILITOT 0.7 01/21/2023   Insomnia/fatigue/depression Depression treated with Wellbutrin 300 mg daily.  We have discussed some subjective memory concerns in the past with reassuring CIT, had been referred to neurology.  No further memory episodes were discussed in May and mood was doing okay at that time.  She is having some trouble with sleep, and daytime somnolence due to that sleep difficulty. Feels like mood doing well, just trouble staying sleep. Sleepytime tea works to get to sleep, wakes up in 2-3 hours. No PND, no orthopnea. Mind is going when  awaken. Frequent travel. Every night wakes up, trouble getting back to sleep. 4-6 hours per night. No recent rx meds for sleep. Prior med caused daytime sedation. Trazodone. Ambien in the past - did not like feeling in am.  No hx of OSA. Some snoring on back. Tired during day. No prior sleep test.  Phone/facebook at bedtime.  Some concern about memory - distractible. Plans to discuss further at next visit. No acute changes.  History Patient Active Problem List   Diagnosis Date Noted   Complete rotator cuff tear of left shoulder 12/15/2022   History of cataract surgery, left 10/23/2022   Acquired trigger finger of right ring finger 06/22/2022   Pain in finger of right hand 06/22/2022   Cataract 06/11/2022   Lumbar radiculopathy 03/16/2022   History of repair of retinal tear by laser photocoagulation 12/21/2021   Cough, persistent 07/10/2021   Globus pharyngeus 07/10/2021   chronic diastolic chf 05/02/2021   Acquired trigger finger of right middle finger 07/14/2020   Squamous cell carcinoma of skin of arm 10/08/2019   Squamous cell carcinoma in situ (SCCIS) of skin 09/18/2019   Double vision 07/12/2019   Age-related vocal fold atrophy 04/30/2019   Muscle tension dysphonia 04/30/2019   Dysphagia 03/04/2019   Hoarseness 01/16/2019   Acquired trigger finger of right index finger 11/04/2018   Trigger finger 09/10/2018   Cervical vertebral fusion 02/14/2018   Cervical post-laminectomy syndrome 11/20/2017   DDD (degenerative disc disease), cervical  11/20/2017   Neck pain 11/20/2017   OA (osteoarthritis) of knee 06/17/2017   History of total knee replacement 06/17/2017   Trochanteric bursitis of left hip 07/03/2015   Routine general medical examination at a health care facility 11/18/2014   CARCINOMA, SKIN, SQUAMOUS CELL 07/06/2010   ACTINIC KERATOSIS 07/06/2010   NEOPLASM, MALIGNANT, BASAL CELL, CARCINOMA, HX OF 07/06/2010   Hyperlipidemia 06/08/2009   DEPRESSION 06/08/2009    POSTMENOPAUSAL STATUS 06/08/2009   IRRITABLE BOWEL SYNDROME 02/17/2008   Backache 02/17/2008   Migraine headache 02/05/2007   Essential hypertension 02/05/2007   Past Medical History:  Diagnosis Date   Arthritis    knees, hands   Bursitis of left hip    Cancer (HCC)    hx skin cancer   Cataract    Phreesia 09/11/2020   Depression    Depression    Phreesia 09/11/2020   GERD (gastroesophageal reflux disease)    INFREQUENT   Headache    HX MIGRAINES    Heart murmur     MVP 30 yrs ago - no treatment needed   Hyperlipidemia    on statin   Hypertension    "flucuations" has never been on any med   Insomnia    Irritable bowel syndrome    s/p subtotal colectomy '86>diarrhea prone   NEOPLASM, MALIGNANT, BASAL CELL, CARCINOMA, HX OF    Seasonal allergies    Past Surgical History:  Procedure Laterality Date   ABDOMINOPLASTY     ANTERIOR CERVICAL DECOMP/DISCECTOMY FUSION N/A 02/14/2018   Procedure: CERVICAL THREE-FOUR,CERVICAL FOUR-FIVE ACDF, REMOVAL CERVICAL FIVE-SEVEN  PLATE.;  Surgeon: Tia Alert, MD;  Location: [facility] OR;  Service: Neurosurgery;  Laterality: N/A;  anterior   BACK SURGERY  1999   lower back   BREAST ENHANCEMENT SURGERY  1983   BREAST IMPLANT REMOVAL  08/2010   BREAST SURGERY     Augmentation in 1983, Reduction 2011   BUNIONECTOMY     right  and left foot   CERVICAL SPINE SURGERY  12/18/1999   C5/6 and C6/7   CHOLECYSTECTOMY N/A    Phreesia 09/11/2020   COLECTOMY  1986   COLON SURGERY N/A    Phreesia 09/11/2020   COSMETIC SURGERY     EXCISION/RELEASE BURSA HIP Left 07/04/2015   Procedure: LEFT HIP BURSECTOMY WITH ;  Surgeon: Ollen Gross, MD;  Location: [facility] ORS;  Service: Orthopedics;  Laterality: Left;   EYE SURGERY     Lasik 2010   eyelid surgery     FRACTURE SURGERY     HYSTEROSCOPY WITH D & C  08/17/2011   Procedure: DILATATION AND CURETTAGE (D&C) /HYSTEROSCOPY;  Surgeon: Meriel Pica;  Location: [facility] ORS;  Service: Gynecology;  Laterality:  N/A;   INJECTION KNEE Right 07/04/2015   Procedure: CORTISONE INJECTION RIGHT KNEE ;  Surgeon: Ollen Gross, MD;  Location: [facility];  Service: Orthopedics;  Laterality: Right;   JOINT REPLACEMENT N/A    Phreesia 09/11/2020   KNEE ARTHROSCOPY     right x 2   KNEE RECONSTRUCTION, MEDIAL PATELLAR FEMORAL LIGAMENT     right   LASIK  2009    bilateral   left foot surgery     removed foreign object   OPEN SURGICAL REPAIR OF GLUTEAL TENDON Left 07/04/2015   Procedure: GLUTEAL TENDON REPAIR ;  Surgeon: Ollen Gross, MD;  Location: [facility];  Service: Orthopedics;  Laterality: Left;   REDUCTION MAMMAPLASTY Bilateral    SPINE SURGERY N/A    Phreesia 09/11/2020  thumb surgery     TOTAL KNEE ARTHROPLASTY Right 06/17/2017   Procedure: RIGHT TOTAL KNEE ARTHROPLASTY;  Surgeon: Ollen Gross, MD;  Location: [facility];  Service: Orthopedics;  Laterality: Right;   TRIGGER FINGER RELEASE Right 09/11/2021   TUBAL LIGATION     Allergies  Allergen Reactions   Lactose Intolerance (Gi) Diarrhea   Nsaids Other (See Comments)    Hurts stomach   Other Rash    Retinol cream   Retinoids Hives, Itching and Rash   Prior to Admission medications   Medication Sig Start Date End Date Taking? Authorizing Provider  acetaminophen (TYLENOL) 500 MG tablet Take 1,000 mg by mouth every 8 (eight) hours as needed for moderate pain or headache.    Yes [provider]  atorvastatin (LIPITOR) 20 MG tablet Take 1 tablet (20 mg total) by mouth every morning. 07/26/22  Yes Shade Flood, MD  buPROPion (WELLBUTRIN XL) 300 MG 24 hr tablet Take 1 tablet (300 mg total) by mouth daily. 07/26/22  Yes Shade Flood, MD  cholecalciferol (VITAMIN D3) 25 MCG (1000 UNIT) tablet Take by mouth. 03/04/19  Yes [provider]  Cyanocobalamin (VITAMIN B12) 1000 MCG TBCR Take 1,000 mcg by mouth daily.   Yes [provider]  diclofenac sodium (VOLTAREN) 1 % GEL Apply 1 g topically daily as needed.    Yes  [provider]  fluorouracil (EFUDEX) 5 % cream fluorouracil 5 % topical cream 03/04/19  Yes [provider]  fluticasone (FLONASE) 50 MCG/ACT nasal spray  07/10/21  Yes [provider]  furosemide (LASIX) 40 MG tablet TAKE 1 TABLET DAILY or as advised by cardioloy 07/26/22  Yes Shade Flood, MD  HYDROcodone-acetaminophen (NORCO/VICODIN) 5-325 MG tablet Take 1 tablet by mouth every 6 (six) hours as needed. 11/08/20  Yes [provider]  Liniments (SALONPAS PAIN RELIEF PATCH EX) Apply 2 patches topically daily.   Yes [provider]  loperamide (IMODIUM A-D) 2 MG tablet Take 1 tablet (2 mg total) by mouth 3 (three) times daily as needed for diarrhea or loose stools. 04/13/19  Yes Stallings, Zoe A, MD  loratadine (CLARITIN) 10 MG tablet Take 10 mg by mouth daily.   Yes [provider]  MAGNESIUM GLYCINATE PO Take 450 mg by mouth.   Yes [provider]  niacinamide 500 MG tablet Take 500 mg by mouth 2 (two) times daily with a meal.   Yes [provider]  potassium chloride (KLOR-CON) 10 MEQ tablet TAKE 1 TABLET DAILY (PLEASE CALL [phone] TO SCHEDULE AN APPOINTMENT FOR FUTURE REFILLS, SECOND ATTEMPT) 05/27/23  Yes Nahser, Deloris Ping, MD  promethazine (PHENERGAN) 25 MG tablet Take 1 tablet (25 mg total) by mouth every 6 (six) hours as needed for nausea or vomiting. 01/11/22  Yes Shade Flood, MD  Simethicone (PHAZYME PO) Take 1 tablet by mouth every morning.    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  Retired.  Education some college.  2 children.  Caffeine 3-4 glasses of tea/ day.   Social Determinants of Health   Financial Resource Strain: Low Risk  (07/29/2023)   Overall Financial Resource Strain (CARDIA)    Difficulty of Paying Living Expenses: Not hard at all  Food Insecurity: No Food Insecurity (07/29/2023)   Hunger Vital Sign    Worried About Running Out of Food in the Last Year: Never true    Ran Out of Food in the Last Year: Never true  Transportation Needs: No Transportation Needs (07/29/2023)   PRAPARE - Administrator, Civil Service (Medical): No    Lack of Transportation (Non-Medical): No  Physical Activity: Sufficiently Active (07/29/2023)   Exercise Vital Sign    Days of Exercise per Week: 1 day    Minutes of Exercise per Session: 150+ min  Stress: No Stress Concern Present (07/29/2023)   [employer] of Occupational Health - Occupational Stress Questionnaire    Feeling of Stress : Not at all  Social Connections: Socially Integrated (07/29/2023)   Social Connection and Isolation Panel [NHANES]    Frequency of Communication with Friends and Family: More than three times a week    Frequency of Social Gatherings with Friends and Family: Three times a week    Attends Religious Services: 1 to 4 times per year    Active Member of Clubs or Organizations: Yes    Attends Banker Meetings: More than 4 times per year    Marital Status: Married  Catering manager Violence: Not At Risk (07/18/2022)   Humiliation, Afraid, Rape, and Kick questionnaire    Fear of Current or Ex-Partner: No    Emotionally Abused: No    Physically  Abused: No    Sexually Abused: No    Review of Systems Per HPI.  Objective:   Vitals:   08/01/23 1037  BP: 136/78  Pulse: 72  Temp: 98 F (36.7 C)  TempSrc: Temporal  SpO2: 98%  Weight: 165 lb 6.4 oz (75 kg)  Height: 5\' 6"  (1.676 m)    Physical Exam Vitals reviewed.  Constitutional:      Appearance: Normal appearance. She is well-developed.  HENT:     Head: Normocephalic and atraumatic.  Eyes:     Conjunctiva/sclera: Conjunctivae normal.     Pupils: Pupils are equal, round, and reactive to light.  Neck:     Vascular: No carotid bruit.  Cardiovascular:     Rate and Rhythm: Normal rate and regular rhythm.     Heart sounds: Normal heart sounds.  Pulmonary:     Effort: Pulmonary effort is normal.     Breath sounds: Normal breath sounds.  Abdominal:     Palpations: Abdomen is soft. There is no pulsatile mass.     Tenderness: There is no abdominal tenderness.  Musculoskeletal:     Right lower leg: No edema.     Left lower leg: No edema.  Skin:    General: Skin is warm and dry.  Neurological:     Mental Status: She is alert and oriented to person, place, and time.  Psychiatric:  Mood and Affect: Mood normal.        Behavior: Behavior normal.        Assessment & Plan:  Wonda Downard is a 72 y.o. female . Insomnia, unspecified type - Plan: hydrOXYzine (ATARAX) 10 MG tablet History of snoring - Plan: Ambulatory referral to Sleep Studies Wakes up during night - Plan: Ambulatory referral to Sleep Studies Fatigue, unspecified type - Plan: Ambulatory referral to Sleep Studies, CBC with Differential/Platelet, Comprehensive metabolic panel, TSH  -Insomnia may be multifactorial.  Episodic snoring, differential includes component of sleep apnea but nighttime wakening and difficulty returning to sleep seem to be more psychological insomnia.  Sleep onset has been improved with use of sleepy time tea but also recommended avoidance of electronic media before  bedtime and other sleep hygiene.  Handout given.  Intolerant to some previous medications for sleep, but could try Tylenol PM or low-dose hydroxyzine that was prescribed today, one of the other.  Potential side effects discussed.  Will refer to sleep specialist.  Recheck 6 weeks.  -Fatigue likely related to insufficient sleep as above.  Will check CBC, TSH, CMP.  Sleep specialist eval as above.  RTC precautions.  History of migraine headaches - Plan: promethazine (PHENERGAN) 25 MG tablet  -Infrequent, low-dose Phenergan prescribed as that has been effective previously.  If increased frequency or change in headaches would look into other treatments or headache specialist evaluation.  Mixed hyperlipidemia - Plan: atorvastatin (LIPITOR) 20 MG tablet, Comprehensive metabolic panel, Lipid panel  -Tolerating current dose Lipitor, check labs and adjust plan accordingly.  Depression, recurrent (HCC) - Plan: buPROPion (WELLBUTRIN XL) 300 MG 24 hr tablet  -Reports overall stable control of mood symptoms, continue same dose Wellbutrin  Pedal edema - Plan: potassium chloride (KLOR-CON) 10 MEQ tablet  -Intermittent use of furosemide as above with potassium when furosemide use, refilled.  Appears euvolemic, no change in plans at this time.  No orders of the defined types were placed in this encounter.  Patient Instructions  See information below in sleep.  Okay to continue sleepy time tea.  You can try Tylenol PM or the hydroxyzine that I prescribed today, but watch for any daytime sleepiness worsening with taking that medication and if that occurs stop it.  I did refer you to a sleep specialist to evaluate for possible testing for sleep apnea.  I will also check some other labs for the fatigue, but I still think that is probably from the incomplete sleep.  Try to avoid any electronic media within 30 minutes to an hour before bedtime.  Recheck in 6 weeks, sooner if any new or worsening symptoms.  Let me know if  you have questions and take care.   Insomnia Insomnia is a sleep disorder that makes it difficult to fall asleep or stay asleep. Insomnia can cause fatigue, low energy, difficulty concentrating, mood swings, and poor performance at work or school. There are three different ways to classify insomnia: Difficulty falling asleep. Difficulty staying asleep. Waking up too early in the morning. Any type of insomnia can be long-term (chronic) or short-term (acute). Both are common. Short-term insomnia usually lasts for 3 months or less. Chronic insomnia occurs at least three times a week for longer than 3 months. What are the causes? Insomnia may be caused by another condition, situation, or substance, such as: Having certain mental health conditions, such as anxiety and depression. Using caffeine, alcohol, tobacco, or drugs. Having gastrointestinal conditions, such as gastroesophageal reflux disease (GERD). Having  certain medical conditions. These include: Asthma. Alzheimer's disease. Stroke. Chronic pain. An overactive thyroid gland (hyperthyroidism). Other sleep disorders, such as restless legs syndrome and sleep apnea. Menopause. Sometimes, the cause of insomnia may not be known. What increases the risk? Risk factors for insomnia include: Gender. Females are affected more often than males. Age. Insomnia is more common as people get older. Stress and certain medical and mental health conditions. Lack of exercise. Having an irregular work schedule. This may include working night shifts and traveling between different time zones. What are the signs or symptoms? If you have insomnia, the main symptom is having trouble falling asleep or having trouble staying asleep. This may lead to other symptoms, such as: Feeling tired or having low energy. Feeling nervous about going to sleep. Not feeling rested in the morning. Having trouble concentrating. Feeling irritable, anxious, or  depressed. How is this diagnosed? This condition may be diagnosed based on: Your symptoms and medical history. Your health care provider may ask about: Your sleep habits. Any medical conditions you have. Your mental health. A physical exam. How is this treated? Treatment for insomnia depends on the cause. Treatment may focus on treating an underlying condition that is causing the insomnia. Treatment may also include: Medicines to help you sleep. Counseling or therapy. Lifestyle adjustments to help you sleep better. Follow these instructions at home: Eating and drinking  Limit or avoid alcohol, caffeinated beverages, and products that contain nicotine and tobacco, especially close to bedtime. These can disrupt your sleep. Do not eat a large meal or eat spicy foods right before bedtime. This can lead to digestive discomfort that can make it hard for you to sleep. Sleep habits  Keep a sleep diary to help you and your health care provider figure out what could be causing your insomnia. Write down: When you sleep. When you wake up during the night. How well you sleep and how rested you feel the next day. Any side effects of medicines you are taking. What you eat and drink. Make your bedroom a dark, comfortable place where it is easy to fall asleep. Put up shades or blackout curtains to block light from outside. Use a white noise machine to block noise. Keep the temperature cool. Limit screen use before bedtime. This includes: Not watching TV. Not using your smartphone, tablet, or computer. Stick to a routine that includes going to bed and waking up at the same times every day and night. This can help you fall asleep faster. Consider making a quiet activity, such as reading, part of your nighttime routine. Try to avoid taking naps during the day so that you sleep better at night. Get out of bed if you are still awake after 15 minutes of trying to sleep. Keep the lights down, but try  reading or doing a quiet activity. When you feel sleepy, go back to bed. General instructions Take over-the-counter and prescription medicines only as told by your health care provider. Exercise regularly as told by your health care provider. However, avoid exercising in the hours right before bedtime. Use relaxation techniques to manage stress. Ask your health care provider to suggest some techniques that may work well for you. These may include: Breathing exercises. Routines to release muscle tension. Visualizing peaceful scenes. Make sure that you drive carefully. Do not drive if you feel very sleepy. Keep all follow-up visits. This is important. Contact a health care provider if: You are tired throughout the day. You have trouble in your daily  routine due to sleepiness. You continue to have sleep problems, or your sleep problems get worse. Get help right away if: You have thoughts about hurting yourself or someone else. Get help right away if you feel like you may hurt yourself or others, or have thoughts about taking your own life. Go to your nearest emergency room or: Call 911. Call the National Suicide Prevention Lifeline at 939-562-4604 or 988. This is open 24 hours a day. Text the Crisis Text Line at 475-485-8438. Summary Insomnia is a sleep disorder that makes it difficult to fall asleep or stay asleep. Insomnia can be long-term (chronic) or short-term (acute). Treatment for insomnia depends on the cause. Treatment may focus on treating an underlying condition that is causing the insomnia. Keep a sleep diary to help you and your health care provider figure out what could be causing your insomnia. This information is not intended to replace advice given to you by your health care provider. Make sure you discuss any questions you have with your health care provider. Document Revised: 08/07/2021 Document Reviewed: 08/07/2021 Elsevier Patient Education  2024 Elsevier  Inc.     Signed,   Meredith Staggers, MD [facility], [facility] 08/01/23 10:48 AM

## 2023-08-02 LAB — COMPREHENSIVE METABOLIC PANEL
ALT: 18 U/L (ref 0–35)
AST: 21 U/L (ref 0–37)
Albumin: 4.6 g/dL (ref 3.5–5.2)
Alkaline Phosphatase: 56 U/L (ref 39–117)
BUN: 12 mg/dL (ref 6–23)
CO2: 30 meq/L (ref 19–32)
Calcium: 10 mg/dL (ref 8.4–10.5)
Chloride: 105 meq/L (ref 96–112)
Creatinine, Ser: 0.85 mg/dL (ref 0.40–1.20)
GFR: 68.09 mL/min (ref >60.00)
Glucose, Bld: 86 mg/dL (ref 70–99)
Potassium: 5.3 meq/L — ABNORMAL HIGH (ref 3.5–5.1)
Sodium: 141 meq/L (ref 135–145)
Total Bilirubin: 0.6 mg/dL (ref 0.2–1.2)
Total Protein: 6.7 g/dL (ref 6.0–8.3)

## 2023-08-02 LAB — CBC WITH DIFFERENTIAL/PLATELET
Basophils Absolute: 0.1 10*3/uL (ref 0.0–0.1)
Basophils Relative: 1.2 % (ref 0.0–3.0)
Eosinophils Absolute: 0.2 10*3/uL (ref 0.0–0.7)
Eosinophils Relative: 4.6 % (ref 0.0–5.0)
HCT: 44 % (ref 36.0–46.0)
Hemoglobin: 14.4 g/dL (ref 12.0–15.0)
Lymphocytes Relative: 30 % (ref 12.0–46.0)
Lymphs Abs: 1.4 10*3/uL (ref 0.7–4.0)
MCHC: 32.7 g/dL (ref 30.0–36.0)
MCV: 95.3 fL (ref 78.0–100.0)
Monocytes Absolute: 0.8 10*3/uL (ref 0.1–1.0)
Monocytes Relative: 16.9 % — ABNORMAL HIGH (ref 3.0–12.0)
Neutro Abs: 2.2 10*3/uL (ref 1.4–7.7)
Neutrophils Relative %: 47.3 % (ref 43.0–77.0)
Platelets: 221 10*3/uL (ref 150.0–400.0)
RBC: 4.61 Mil/uL (ref 3.87–5.11)
RDW: 13.4 % (ref 11.5–15.5)
WBC: 4.7 10*3/uL (ref 4.0–10.5)

## 2023-08-02 LAB — LIPID PANEL
Cholesterol: 145 mg/dL (ref 0–200)
HDL: 54.7 mg/dL (ref >39.00)
LDL Cholesterol: 70 mg/dL (ref 0–99)
NonHDL: 90.09
Total CHOL/HDL Ratio: 3
Triglycerides: 99 mg/dL (ref 0.0–149.0)
VLDL: 19.8 mg/dL (ref 0.0–40.0)

## 2023-08-02 LAB — TSH: TSH: 1.08 u[IU]/mL (ref 0.35–5.50)

## 2023-08-06 ENCOUNTER — Telehealth: Payer: Self-pay

## 2023-08-06 DIAGNOSIS — R799 Abnormal finding of blood chemistry, unspecified: Secondary | ICD-10-CM

## 2023-08-06 DIAGNOSIS — E875 Hyperkalemia: Secondary | ICD-10-CM

## 2023-08-06 NOTE — Telephone Encounter (Signed)
Patient called back and discussed labs, notes she is worried about the monocytes states she read this can be indicative of more concerning conditions  Notes she is unsure if she should continue her potassium supplement that she takes the same day as her lasix though she notes she has been out for about a month and hasn't been taking it so wonders how it is still high  And when do you want repeat labs done?   please advise

## 2023-08-06 NOTE — Addendum Note (Signed)
Addended by: Eldred Manges on: 08/06/2023 11:43 AM   Modules accepted: Orders

## 2023-08-06 NOTE — Telephone Encounter (Signed)
Thyroid test, cholesterol levels were normal.  Previous elevated blood counts now look normal.  Overall reassuring blood counts.  Potassium was borderline elevated, other electrolytes looked okay.  I will have the staff call you to schedule a lab only visit to repeat this level to make sure that potassium is not still elevated.  I do see this sometimes occur where it will be normal on repeat testing.  No med changes for now.  Let me know if you have questions.   Dr. Neva Seat

## 2023-08-06 NOTE — Telephone Encounter (Signed)
Please advise 

## 2023-08-06 NOTE — Telephone Encounter (Signed)
-----   Message from Shade Flood sent at 08/06/2023 10:29 AM EST ----- Results sent by MyChart, please schedule lab visit for BMP, hyperkalemia.

## 2023-08-06 NOTE — Telephone Encounter (Signed)
I understand her concern.  White blood cell count was normal and other blood counts looked okay.  Monocytes were slightly elevated but with other labs being normal that would be less concerning.  We certainly can recheck that blood count with her repeat electrolyte tests, can be added to lab only visit, CBC for elevated monocytes or abnormal CBC as diagnosis. Sometimes I will see the potassium levels off with how the blood is processed.  I think that a repeat lab visit next week will be fine. If potassium is still elevated on repeat labs, can look into other causes.  Let me know if there are further questions.

## 2023-08-07 ENCOUNTER — Telehealth: Payer: Self-pay

## 2023-08-07 ENCOUNTER — Other Ambulatory Visit (HOSPITAL_COMMUNITY): Payer: Self-pay

## 2023-08-07 NOTE — Telephone Encounter (Signed)
Pharmacy Patient Advocate Encounter   Received notification from Fax that prior authorization for HYDROXYZINE is required/requested.   Insurance verification completed.   The patient is insured through [employer] .   Per test claim: PA required; PA submitted to above mentioned insurance via Fax Key/confirmation #/EOC Case ID: [id number]  Status is pending

## 2023-08-12 ENCOUNTER — Other Ambulatory Visit (INDEPENDENT_AMBULATORY_CARE_PROVIDER_SITE_OTHER): Payer: Medicare Other

## 2023-08-12 ENCOUNTER — Other Ambulatory Visit (HOSPITAL_COMMUNITY): Payer: Self-pay

## 2023-08-12 ENCOUNTER — Telehealth: Payer: Self-pay

## 2023-08-12 DIAGNOSIS — E875 Hyperkalemia: Secondary | ICD-10-CM

## 2023-08-12 DIAGNOSIS — R799 Abnormal finding of blood chemistry, unspecified: Secondary | ICD-10-CM

## 2023-08-12 LAB — CBC WITH DIFFERENTIAL/PLATELET
Basophils Absolute: 0 10*3/uL (ref 0.0–0.1)
Basophils Relative: 0.7 % (ref 0.0–3.0)
Eosinophils Absolute: 0.3 10*3/uL (ref 0.0–0.7)
Eosinophils Relative: 4.3 % (ref 0.0–5.0)
HCT: 44.5 % (ref 36.0–46.0)
Hemoglobin: 14.3 g/dL (ref 12.0–15.0)
Lymphocytes Relative: 32.4 % (ref 12.0–46.0)
Lymphs Abs: 2.1 10*3/uL (ref 0.7–4.0)
MCHC: 32.2 g/dL (ref 30.0–36.0)
MCV: 95.6 fL (ref 78.0–100.0)
Monocytes Absolute: 0.7 10*3/uL (ref 0.1–1.0)
Monocytes Relative: 10.7 % (ref 3.0–12.0)
Neutro Abs: 3.3 10*3/uL (ref 1.4–7.7)
Neutrophils Relative %: 51.9 % (ref 43.0–77.0)
Platelets: 282 10*3/uL (ref 150.0–400.0)
RBC: 4.66 Mil/uL (ref 3.87–5.11)
RDW: 13.2 % (ref 11.5–15.5)
WBC: 6.4 10*3/uL (ref 4.0–10.5)

## 2023-08-12 LAB — BASIC METABOLIC PANEL
BUN: 18 mg/dL (ref 6–23)
CO2: 26 meq/L (ref 19–32)
Calcium: 9.6 mg/dL (ref 8.4–10.5)
Chloride: 103 meq/L (ref 96–112)
Creatinine, Ser: 0.79 mg/dL (ref 0.40–1.20)
GFR: 74.32 mL/min (ref >60.00)
Glucose, Bld: 100 mg/dL — ABNORMAL HIGH (ref 70–99)
Potassium: 4.2 meq/L (ref 3.5–5.1)
Sodium: 138 meq/L (ref 135–145)

## 2023-08-12 NOTE — Telephone Encounter (Signed)
Pharmacy Patient Advocate Encounter   Received notification from Fax that prior authorization for Promethazine HCl 25MG  tablets is required/requested.   Insurance verification completed.   The patient is insured through [employer] .   Per test claim: PA required; PA submitted to above mentioned insurance via CoverMyMeds Key/confirmation #/EOC [id number] Status is pending

## 2023-08-13 NOTE — Telephone Encounter (Signed)
Pharmacy Patient Advocate Encounter  Received notification from [employer] that Prior Authorization for Promethazine HCl 25MG  tablets has been APPROVED from 07/13/2023 to 08/11/2024   PA #/Case ID/Reference #:  [id number]

## 2023-08-15 ENCOUNTER — Other Ambulatory Visit (HOSPITAL_COMMUNITY): Payer: Self-pay

## 2023-08-20 ENCOUNTER — Other Ambulatory Visit (HOSPITAL_COMMUNITY): Payer: Self-pay

## 2023-08-20 NOTE — Telephone Encounter (Signed)
Pharmacy Patient Advocate Encounter  Received notification from [employer] that Prior Authorization for Hydroxyzine has been DENIED.  Full denial letter will be uploaded to the media tab. See denial reason below.  Per pts Plan (Medicare Part D) must be used for anxiety and the patient has had to trial and fail the listed medications below.     PA #/Case ID/Reference #: EOC Case ID: [id number]

## 2023-08-21 NOTE — Telephone Encounter (Signed)
Patient was contacted about this option and she has decided to continue with Tylenol PM and then will let us know if anything different is needed

## 2023-08-21 NOTE — Telephone Encounter (Signed)
Please advise what should be done next

## 2023-08-21 NOTE — Telephone Encounter (Signed)
Please call patient.  Hydroxyzine was not covered by insurance for insomnia but she has option of buying this out-of-pocket. It may not be too costly if she wanted to try that approach or use of discount pharmacy card.  Let me know if there are questions.

## 2023-08-27 ENCOUNTER — Ambulatory Visit (INDEPENDENT_AMBULATORY_CARE_PROVIDER_SITE_OTHER): Payer: Medicare Other | Admitting: Neurology

## 2023-08-27 ENCOUNTER — Encounter: Payer: Self-pay | Admitting: Family Medicine

## 2023-08-27 ENCOUNTER — Encounter: Payer: Self-pay | Admitting: Neurology

## 2023-08-27 VITALS — BP 127/83 | HR 76 | Ht 66.6 in | Wt 161.0 lb

## 2023-08-27 DIAGNOSIS — R519 Headache, unspecified: Secondary | ICD-10-CM | POA: Diagnosis not present

## 2023-08-27 DIAGNOSIS — Z9189 Other specified personal risk factors, not elsewhere classified: Secondary | ICD-10-CM | POA: Diagnosis not present

## 2023-08-27 DIAGNOSIS — G4700 Insomnia, unspecified: Secondary | ICD-10-CM | POA: Diagnosis not present

## 2023-08-27 DIAGNOSIS — E663 Overweight: Secondary | ICD-10-CM

## 2023-08-27 DIAGNOSIS — R0683 Snoring: Secondary | ICD-10-CM | POA: Diagnosis not present

## 2023-08-27 DIAGNOSIS — R351 Nocturia: Secondary | ICD-10-CM | POA: Diagnosis not present

## 2023-08-27 NOTE — Progress Notes (Signed)
Subjective:    Patient ID: [name], female    DOB: Jul 08, 1951, 72 y.o.   MRN: [id number]  HPI    Huston Foley, MD, PhD [facility] [address], [address] [address] [place], [place] [address]  Dear Dr. Neva Seat,   I saw your patient, [name], upon your kind request, in my sleep clinic today for initial consultation of her sleep disorder, in particular, concern for obstructive sleep apnea. The patient is unaccompanied today.  As you know, Ms. Berkheiser is a 72 year old female with an underlying medical history of arthritis with status post right total knee replacement, status post neck surgery, history of left hip bursitis, migraine headaches, edema, skin cancer, reflux disease, hypertension, hyperlipidemia, irritable bowel syndrome, seasonal allergies, depression, and mildly overweight state, who reports snoring and excessive daytime somnolence.  She has trouble maintaining sleep.  She does not wake up rested.  Her Epworth sleepiness score is 2 out of 24, fatigue severity score is 19 out of 63.  I reviewed your office note from 08/01/2023.  She tried melatonin use ago but not recently.  She had a prescription for hydroxyzine on her list of medications but does not recall ever trying it.  She is encouraged to talk to you office about the low-dose hydroxyzine for sleep.  She drinks decaf tea.  She does not drink any coffee, she drinks alcohol rarely, she is a non-smoker.  She is not aware of any family history of sleep apnea.  She had a tonsillectomy as a child.  She lives with her husband, she has 2 grown children and he has 2 grown children from before.  They have 2 dogs in the household, none of them sleep on the bed with them but they typically sleep in the room.  She has been trying sleepy time tea at night for years.  She currently uses 2 teabags at night.  He has tried Tylenol PM, usually 1 pill about an hour before bedtime.  Bedtime varies, between  midnight and 1 and rise time is currently around 7 or 8.  She is retired.  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  Does have discomfort at night in her neck, she has limited neck turns to the left, history of neck fusion.  She has nocturia about once per average night she has a history of migraine headaches but has not had any recent migraines but has had occasional morning headaches.  She does not typically watch TV in her bedroom but does use her iPad until she is sleepy.  Her Past Medical History Is Significant For: Past Medical History:  Diagnosis Date   Arthritis    knees, hands   Bursitis of left hip    Cancer (HCC)    hx skin cancer   Cataract    Phreesia 09/11/2020   Depression    Depression    Phreesia 09/11/2020   GERD (gastroesophageal reflux disease)    INFREQUENT   Headache    HX MIGRAINES    Heart murmur     MVP 30 yrs ago - no treatment needed   Hyperlipidemia    on statin   Hypertension    "flucuations" has never been on any med   Insomnia    Irritable bowel syndrome    s/p subtotal colectomy '86>diarrhea prone   NEOPLASM, MALIGNANT, BASAL CELL, CARCINOMA, HX OF    Seasonal allergies     Her Past Surgical History Is Significant For: Past  Surgical History:  Procedure Laterality Date   ABDOMINOPLASTY     ANTERIOR CERVICAL DECOMP/DISCECTOMY FUSION N/A 02/14/2018   Procedure: CERVICAL THREE-FOUR,CERVICAL FOUR-FIVE ACDF, REMOVAL CERVICAL FIVE-SEVEN  PLATE.;  Surgeon: Tia Alert, MD;  Location: [facility] OR;  Service: Neurosurgery;  Laterality: N/A;  anterior   BACK SURGERY  1999   lower back   BREAST ENHANCEMENT SURGERY  1983   BREAST IMPLANT REMOVAL  08/2010   BREAST SURGERY     Augmentation in 1983, Reduction 2011   BUNIONECTOMY     right  and left foot   CERVICAL SPINE SURGERY  12/18/1999   C5/6 and C6/7   CHOLECYSTECTOMY N/A    Phreesia 09/11/2020   COLECTOMY  1986   COLON SURGERY N/A    Phreesia 09/11/2020   COSMETIC SURGERY      EXCISION/RELEASE BURSA HIP Left 07/04/2015   Procedure: LEFT HIP BURSECTOMY WITH ;  Surgeon: Ollen Gross, MD;  Location: [facility] ORS;  Service: Orthopedics;  Laterality: Left;   EYE SURGERY     Lasik 2010   eyelid surgery     FRACTURE SURGERY     HYSTEROSCOPY WITH D & C  08/17/2011   Procedure: DILATATION AND CURETTAGE (D&C) /HYSTEROSCOPY;  Surgeon: Meriel Pica;  Location: [facility] ORS;  Service: Gynecology;  Laterality: N/A;   INJECTION KNEE Right 07/04/2015   Procedure: CORTISONE INJECTION RIGHT KNEE ;  Surgeon: Ollen Gross, MD;  Location: [facility] ORS;  Service: Orthopedics;  Laterality: Right;   JOINT REPLACEMENT N/A    Phreesia 09/11/2020   KNEE ARTHROSCOPY     right x 2   KNEE RECONSTRUCTION, MEDIAL PATELLAR FEMORAL LIGAMENT     right   LASIK  2009    bilateral   left foot surgery     removed foreign object   OPEN SURGICAL REPAIR OF GLUTEAL TENDON Left 07/04/2015   Procedure: GLUTEAL TENDON REPAIR ;  Surgeon: Ollen Gross, MD;  Location: [facility] OR[facility];  Service: Orthopedics;  Laterality: Left;   REDUCTION MAMMAPLASTY Bilateral    SPINE SURGERY N/A    Phreesia 09/11/2020   thumb surgery     TOTAL KNEE ARTHROPLASTY Right 06/17/2017   Procedure: RIGHT TOTAL KNEE ARTHROPLASTY;  Surgeon: Ollen Gross, MD;  Location: [facility] OR[facility];  Service: Orthopedics;  Laterality: Right;   TRIGGER FINGER RELEASE Right 09/11/2021   TUBAL LIGATION      Her Family History Is Significant For: Family History  Problem Relation Age of Onset   Hypertension Mother    Heart disease Mother    Stomach cancer Father 13   Cancer Father        stomach and liver cancer   Thyroid disease Sister    Breast cancer Sister    Hypertension Sister    Psoriasis Sister    High Cholesterol Sister    Neuropathy Sister    Diabetes Brother    Heart disease Brother    Hypertension Brother    Hypertension Brother    Heart disease Brother    Cancer Daughter        breast cancer   Hypertension Daughter    Colon cancer Neg Hx     Esophageal cancer Neg Hx     Her Social History Is Significant For: Social History   Socioeconomic History   Marital status: Married    Spouse name: Not on file   Number of children: 2   Years of education: Not on file   Highest education level: Some college, no degree  Occupational  History   Occupation: retired  Tobacco Use   Smoking status: Never   Smokeless tobacco: Never   Tobacco comments:    Married, 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  Retired.  Education some college.  2 children.  Caffeine 3-4 glasses of tea/ day.   Social Drivers of Corporate investment banker Strain: Low Risk  (07/29/2023)   Overall Financial Resource Strain (CARDIA)    Difficulty of Paying Living Expenses: Not hard at all  Food Insecurity: No Food Insecurity (07/29/2023)   Hunger Vital Sign    Worried About Running Out of Food in the Last Year: Never true    Ran Out of Food in the Last Year: Never true  Transportation Needs: No Transportation Needs (07/29/2023)   PRAPARE - Administrator, Civil Service (Medical): No    Lack of Transportation (Non-Medical): No  Physical Activity: Sufficiently Active (07/29/2023)   Exercise Vital Sign    Days of Exercise per Week: 1 day    Minutes of Exercise per Session: 150+ min  Stress: No Stress Concern Present (07/29/2023)   [employer] of Occupational Health - Occupational Stress Questionnaire    Feeling of Stress : Not at all  Social Connections: Socially Integrated (07/29/2023)   Social Connection and Isolation Panel [NHANES]    Frequency of Communication with  Friends and Family: More than three times a week    Frequency of Social Gatherings with Friends and Family: Three times a week    Attends Religious Services: 1 to 4 times per year    Active Member of Clubs or Organizations: Yes    Attends Engineer, structural: More than 4 times per year    Marital Status: Married    Her Allergies Are:  Allergies  Allergen Reactions   Lactose Intolerance (Gi) Diarrhea   Nsaids Other (See Comments)    Hurts stomach   Other Rash    Retinol cream   Retinoids Hives, Itching and Rash  :   Her Current Medications Are:  Outpatient Encounter Medications as of 08/27/2023  Medication Sig   acetaminophen (TYLENOL) 500 MG tablet Take 1,000 mg by mouth every 8 (eight) hours as needed for moderate pain or headache.    atorvastatin (LIPITOR) 20 MG tablet Take 1 tablet (20 mg total) by mouth every morning.   buPROPion (WELLBUTRIN XL) 300 MG 24 hr tablet Take 1 tablet (300 mg total) by mouth daily.   cholecalciferol (VITAMIN D3) 25 MCG (1000 UNIT) tablet Take by mouth.   Cyanocobalamin (VITAMIN B12) 1000 MCG TBCR Take 1,000 mcg by mouth daily.   diclofenac sodium (VOLTAREN) 1 % GEL Apply 1 g topically daily as needed.    diphenhydramine-acetaminophen (TYLENOL PM EXTRA STRENGTH) 25-500 MG TABS tablet Take 1 tablet by mouth at bedtime as needed.   fluorouracil (EFUDEX) 5 % cream fluorouracil 5 % topical cream   fluticasone (FLONASE) 50 MCG/ACT nasal spray    furosemide (LASIX) 40 MG tablet TAKE 1 TABLET DAILY or as advised by cardioloy   HYDROcodone-acetaminophen (NORCO/VICODIN) 5-325  MG tablet Take 1 tablet by mouth every 6 (six) hours as needed.   Liniments (SALONPAS PAIN RELIEF PATCH EX) Apply 2 patches topically daily.   loperamide (IMODIUM A-D) 2 MG tablet Take 1 tablet (2 mg total) by mouth 3 (three) times daily as needed for diarrhea or loose stools. (Patient taking differently: Take 2 mg by mouth 2 (two) times daily.)   loratadine (CLARITIN) 10 MG  tablet Take 10 mg by mouth daily.   MAGNESIUM GLYCINATE PO Take 450 mg by mouth.   niacinamide 500 MG tablet Take 500 mg by mouth 2 (two) times daily with a meal.   potassium chloride (KLOR-CON) 10 MEQ tablet Take 1 tablet (10 mEq total) by mouth daily as needed (with doing of furosemide.).   promethazine (PHENERGAN) 25 MG tablet Take 0.5-1 tablets (12.5-25 mg total) by mouth every 6 (six) hours as needed for nausea or vomiting (headache).   Simethicone (PHAZYME PO) Take 1 tablet by mouth every morning.    [DISCONTINUED] hydrOXYzine (ATARAX) 10 MG tablet Take 1 tablet (10 mg total) by mouth at bedtime as needed.   No facility-administered encounter medications on file as of 08/27/2023.  :   Review of Systems:  Out of a complete 14 point review of systems, all are reviewed and negative with the exception of these symptoms as listed below:  Review of Systems  Neurological:        Rm 9. NP / Internal / Nighttime wakening, daytime fatigue. Occasional snoring. Eval for possible sleep study to r/o OSA. /No prior SS FSS 19/ESS 2       Objective:   Physical Exam   Physical Examination:   Vitals:   08/27/23 1244  BP: 127/83  Pulse: 76    General Examination: The patient is a very pleasant 72 y.o. female in no acute distress. She appears well-developed and well-nourished and well groomed.   HEENT: Normocephalic, atraumatic, pupils are equal, round and reactive to light, extraocular tracking is good without limitation to gaze excursion or nystagmus noted. Hearing is grossly intact. Face is symmetric with normal facial animation. Speech is clear with no dysarthria noted. There is no hypophonia. There is no lip, neck/head, jaw or voice tremor. Neck is supple with full range of passive and active motion. There are no carotid bruits on auscultation. Oropharynx exam reveals: mild mouth dryness, good dental hygiene and mild airway crowding, due to small airway entry and redundant soft palate.   Mallampati class II.  Tonsils absent, neck circumference 13-1/2 inches.  Mild overbite noted.  She does have caps/crowns and 1 implant.  Chest: Clear to auscultation without wheezing, rhonchi or crackles noted.  Heart: S1+S2+0, regular and normal without murmurs, rubs or gallops noted.   Abdomen: Soft, non-tender and non-distended.  Extremities: There is no pitting edema in the distal lower extremities bilaterally.   Skin: Warm and dry without trophic changes noted.   Musculoskeletal: exam reveals no obvious joint deformities.   Neurologically:  Mental status: The patient is awake, alert and oriented in all 4 spheres. Her immediate and remote memory, attention, language skills and fund of knowledge are appropriate. There is no evidence of aphasia, agnosia, apraxia or anomia. Speech is clear with normal prosody and enunciation. Thought process is linear. Mood is normal and affect is normal.  Cranial nerves II - XII are as described above under HEENT exam.  Motor exam: Normal bulk, strength and tone is noted. There is no obvious action or resting tremor.  Fine motor  skills and coordination: grossly intact.  Cerebellar testing: No dysmetria or intention tremor. There is no truncal or gait ataxia.  Sensory exam: intact to light touch in the upper and lower extremities.  Gait, station and balance: She stands easily. No veering to one side is noted. No leaning to one side is noted. Posture is age-appropriate and stance is narrow based. Gait shows normal stride length and normal pace. No problems turning are noted.       Assessment & Plan:   In summary, Abagayle Romanello is a very pleasant 72 y.o.-year old female with an underlying medical history of arthritis with status post right total knee replacement, status post neck surgery, history of left hip bursitis, migraine headaches, edema, skin cancer, reflux disease, hypertension, hyperlipidemia, irritable bowel syndrome, seasonal allergies,  depression, and mildly overweight state, whose history and physical exam are concerning for sleep disordered breathing, particularly obstructive sleep apnea (OSA). A laboratory attended sleep study is typically considered "gold standard" for evaluation of sleep disordered breathing.   I had a long chat with the patient about my findings and the diagnosis of sleep apnea, particularly OSA, its prognosis and treatment options. We talked about medical/conservative treatments, surgical interventions and non-pharmacological approaches for symptom control. I explained, in particular, the risks and ramifications of untreated moderate to severe OSA, especially with respect to developing cardiovascular disease down the road, including congestive heart failure (CHF), difficult to treat hypertension, cardiac arrhythmias (particularly A-fib), neurovascular complications including TIA, stroke and dementia. Even type 2 diabetes has, in part, been linked to untreated OSA. Symptoms of untreated OSA may include (but may not be limited to) daytime sleepiness, nocturia (i.e. frequent nighttime urination), memory problems, mood irritability and suboptimally controlled or worsening mood disorder such as depression and/or anxiety, lack of energy, lack of motivation, physical discomfort, as well as recurrent headaches, especially morning or nocturnal headaches. We talked about the importance of maintaining a healthy lifestyle and striving for healthy weight. In addition, we talked about the importance of striving for and maintaining good sleep hygiene. I recommended a sleep study at this time. I outlined the differences between a laboratory attended sleep study which is considered more comprehensive and accurate over the option of a home sleep test (HST); the latter may lead to underestimation of sleep disordered breathing in some instances and does not help with diagnosing upper airway resistance syndrome and is not accurate enough to  diagnose primary central sleep apnea typically. I outlined possible surgical and non-surgical treatment options of OSA, including the use of a positive airway pressure (PAP) device (i.e. CPAP, AutoPAP/APAP or BiPAP in certain circumstances), a custom-made dental device (aka oral appliance, which would require a referral to a specialist dentist or orthodontist typically, and is generally speaking not considered for patients with full dentures or edentulous state), upper airway surgical options, such as traditional UPPP (which is not considered a first-line treatment) or the Inspire device (hypoglossal nerve stimulator, which would involve a referral for consultation with an ENT surgeon, after careful selection, following inclusion criteria - also not first-line treatment). I explained the PAP treatment option to the patient in detail, as this is generally considered first-line treatment.  The patient indicated that she would be willing to try PAP therapy, if the need arises. I explained the importance of being compliant with PAP treatment, not only for insurance purposes but primarily to improve patient's symptoms symptoms, and for the patient's long term health benefit, including to reduce Her cardiovascular risks longer-term.  We will pick up our discussion about the next steps and treatment options after testing.  We will keep her posted as to the test results by phone call and/or MyChart messaging where possible.  We will plan to follow-up in sleep clinic accordingly as well.  I answered all her questions today and the patient was in agreement.   I encouraged her to call with any interim questions, concerns, problems or updates or email [place] through MyChart.  Generally speaking, sleep test authorizations may take up to 2 weeks, sometimes less, sometimes longer, the patient is encouraged to get in touch with [place] if they do not hear back from the sleep lab staff directly within the next 2 weeks.  Thank you  very much for allowing me to participate in the care of this nice patient. If I can be of any further assistance to you please do not hesitate to call me at [phone].  Sincerely,   Huston Foley, MD, PhD

## 2023-08-27 NOTE — Patient Instructions (Signed)

## 2023-08-28 NOTE — Telephone Encounter (Signed)
T is going to try and pay OOP for Atarax due to insurance denial but needs a new Rx please write for pt and send to [facility]

## 2023-08-29 MED ORDER — HYDROXYZINE HCL 10 MG PO TABS: 10.0000 mg | ORAL_TABLET | Freq: Every evening | ORAL | 2 refills | Status: DC | PRN

## 2023-08-29 NOTE — Telephone Encounter (Signed)
Noted - sent to her pharmacy.

## 2023-09-02 ENCOUNTER — Telehealth: Payer: Self-pay | Admitting: Neurology

## 2023-09-02 NOTE — Telephone Encounter (Signed)
LVM for pt to call back to schedule   [place] Medicare/BCBS supp no auth req   May bring sleepy time tea, her own pillow, hydroxyzine, melatonin and tylenol PM

## 2023-09-03 ENCOUNTER — Other Ambulatory Visit (HOSPITAL_COMMUNITY): Payer: Self-pay

## 2023-09-12 ENCOUNTER — Ambulatory Visit: Payer: Medicare Other | Admitting: Family Medicine

## 2023-09-18 NOTE — Telephone Encounter (Signed)
 I spoke with the patient to schedule her SS, she stated she wants to check some dates and will call back to schedule.

## 2023-09-18 NOTE — Telephone Encounter (Signed)
 Patient called back.  [place] Medicare/BCBS supp no auth req EE per Dr. Buck May bring sleepy time tea, her own pillow, hydroxyzine , melatonin and tylenol  PM   She is scheduled at [facility] for 11/03/23 at 9 pm. She requested February appt.   Mailed packet to the patient.

## 2023-10-08 DIAGNOSIS — D485 Neoplasm of uncertain behavior of skin: Secondary | ICD-10-CM | POA: Diagnosis not present

## 2023-10-08 DIAGNOSIS — L570 Actinic keratosis: Secondary | ICD-10-CM | POA: Diagnosis not present

## 2023-10-08 DIAGNOSIS — D225 Melanocytic nevi of trunk: Secondary | ICD-10-CM | POA: Diagnosis not present

## 2023-10-08 DIAGNOSIS — L578 Other skin changes due to chronic exposure to nonionizing radiation: Secondary | ICD-10-CM | POA: Diagnosis not present

## 2023-10-08 DIAGNOSIS — Z85828 Personal history of other malignant neoplasm of skin: Secondary | ICD-10-CM | POA: Diagnosis not present

## 2023-10-08 DIAGNOSIS — L814 Other melanin hyperpigmentation: Secondary | ICD-10-CM | POA: Diagnosis not present

## 2023-10-08 DIAGNOSIS — Z86018 Personal history of other benign neoplasm: Secondary | ICD-10-CM | POA: Diagnosis not present

## 2023-10-08 DIAGNOSIS — L821 Other seborrheic keratosis: Secondary | ICD-10-CM | POA: Diagnosis not present

## 2023-10-09 ENCOUNTER — Ambulatory Visit: Payer: Medicare Other | Admitting: Family Medicine

## 2023-10-14 ENCOUNTER — Ambulatory Visit: Payer: Medicare Other | Admitting: Family Medicine

## 2023-10-16 DIAGNOSIS — M65341 Trigger finger, right ring finger: Secondary | ICD-10-CM | POA: Diagnosis not present

## 2023-11-01 ENCOUNTER — Other Ambulatory Visit: Payer: Self-pay | Admitting: Family Medicine

## 2023-11-01 DIAGNOSIS — Z1231 Encounter for screening mammogram for malignant neoplasm of breast: Secondary | ICD-10-CM

## 2023-11-03 ENCOUNTER — Ambulatory Visit (INDEPENDENT_AMBULATORY_CARE_PROVIDER_SITE_OTHER): Payer: Medicare Other | Admitting: Neurology

## 2023-11-03 DIAGNOSIS — G4700 Insomnia, unspecified: Secondary | ICD-10-CM

## 2023-11-03 DIAGNOSIS — R519 Headache, unspecified: Secondary | ICD-10-CM

## 2023-11-03 DIAGNOSIS — E663 Overweight: Secondary | ICD-10-CM

## 2023-11-03 DIAGNOSIS — Z9189 Other specified personal risk factors, not elsewhere classified: Secondary | ICD-10-CM

## 2023-11-03 DIAGNOSIS — G4733 Obstructive sleep apnea (adult) (pediatric): Secondary | ICD-10-CM

## 2023-11-03 DIAGNOSIS — R351 Nocturia: Secondary | ICD-10-CM

## 2023-11-03 DIAGNOSIS — R0683 Snoring: Secondary | ICD-10-CM

## 2023-11-04 NOTE — Procedures (Unsigned)
 Physician Interpretation:    Referred by: Shade Flood, MD    History and Indication for Testing: 73 year old female with an underlying medical history of arthritis with status post right total knee replacement, status post neck surgery, history of left hip bursitis, migraine headaches, edema, skin cancer, reflux disease, hypertension, hyperlipidemia, irritable bowel syndrome, seasonal allergies, depression, and mildly overweight state, who reports snoring and excessive daytime somnolence.  She has trouble maintaining sleep.  She does not wake up rested.  Her Epworth sleepiness score is 2 out of 24, fatigue severity score is 19 out of 63.    EEG: Review of the EEG showed no abnormal electrical discharges and symmetrical bihemispheric findings.     EKG: The EKG revealed normal sinus rhythm (NSR). ***   AUDIO/VIDEO REVIEW: The audio and video review did not show any abnormal or unusual behaviors, movements, phonations or vocalizations. The patient took *** restroom breaks. Snoring was noted, ***   POST-STUDY QUESTIONNAIRE: Post study, the patient indicated, that sleep was *** the same as usual.    IMPRESSION:   ***Obstructive Sleep Apnea (OSA) ***Central Sleep Apnea (CSA) ***Primary Snoring ***Primary Central Sleep Apnea ***Complex Sleep Apnea ***PLMD (periodic limb movement disorder [of sleep]) ***Dysfunctions associated with sleep stages or arousal from sleep ***Non-specific abnormal electrocardiogram (EKG) ***Poor sleep pattern ***Inconclusive Test   RECOMMENDATIONS:        I certify that I have reviewed the entire raw data recording prior to the issuance of this report in accordance with the Standards of Accreditation of the American Academy of Sleep Medicine (AASM).   Huston Foley, MD, PhD Medical Director, [facility]) Diplomat, ABPN (Neurology and Sleep)   Technical Report:   ***  Titration Table:  ***

## 2023-11-05 DIAGNOSIS — M19012 Primary osteoarthritis, left shoulder: Secondary | ICD-10-CM | POA: Diagnosis not present

## 2023-11-07 ENCOUNTER — Ambulatory Visit
Admission: RE | Admit: 2023-11-07 | Discharge: 2023-11-07 | Disposition: A | Discharge status: Home / Self Care | Payer: Medicare Other | Source: Ambulatory Visit | Attending: Family Medicine | Admitting: Family Medicine

## 2023-11-07 DIAGNOSIS — Z1231 Encounter for screening mammogram for malignant neoplasm of breast: Secondary | ICD-10-CM

## 2023-11-09 ENCOUNTER — Other Ambulatory Visit: Payer: Self-pay | Admitting: Family Medicine

## 2023-11-11 ENCOUNTER — Telehealth: Payer: Self-pay

## 2023-11-11 NOTE — Telephone Encounter (Signed)
 Looks like she would have been due for refill in November is this an appropriate refill or should it wait for appt on 11/18/23

## 2023-11-11 NOTE — Telephone Encounter (Signed)
 Called pt and informed her per Dr. Frances Furbish "recent sleep study did show overall rather mild obstructive sleep apnea. Treatment is optional and we can consider treatment with an autoPAP machine. Alternatively, she can be considered for an oral appliance. Please let me know how she would like to proceed. Thanks, Huston Foley, MD, PhD [facility]) " Pt states she would like to wait and speak to her husband first and will give [place] a call back when she ready. Pt verbalized understanding. Pt had no questions at this time but was encouraged to call back if questions arise.

## 2023-11-11 NOTE — Telephone Encounter (Signed)
-----   Message from Huston Foley sent at 11/07/2023  5:13 PM EST ----- Please call and notify the patient that the recent sleep study did show overall rather mild obstructive sleep apnea. Treatment is optional and we can consider treatment with an autoPAP machine. Alternatively, she can be considered for an oral appliance. Please let me know how she would like to proceed. Thanks,  Huston Foley, MD, PhD [facility])

## 2023-11-12 ENCOUNTER — Telehealth: Payer: Self-pay | Admitting: Family Medicine

## 2023-11-12 ENCOUNTER — Other Ambulatory Visit: Payer: Self-pay | Admitting: Family Medicine

## 2023-11-12 DIAGNOSIS — R928 Other abnormal and inconclusive findings on diagnostic imaging of breast: Secondary | ICD-10-CM

## 2023-11-12 NOTE — Telephone Encounter (Signed)
 Placed in sign folder at nurses station on 11/12/2023

## 2023-11-12 NOTE — Telephone Encounter (Signed)
 Type of form received: STAT order for Mammogram  Additional comments:   Received by: [employer] - Front Desk  Form should be Faxed/mailed to: (address/ fax #) Fax to [phone]  Is patient requesting call for pickup: N/A  Form placed:  Labeled & placed in provider bin  Attach charge sheet.  Provider will determine charge.  Individual made aware of 3-5 business day turn around? N/A

## 2023-11-13 NOTE — Telephone Encounter (Signed)
 Paperwork completed and placed in fax bin at back nurse station

## 2023-11-18 ENCOUNTER — Ambulatory Visit: Payer: BLUE CROSS/BLUE SHIELD | Admitting: Family Medicine

## 2023-11-18 ENCOUNTER — Encounter: Payer: Self-pay | Admitting: Family Medicine

## 2023-11-18 VITALS — BP 126/60 | HR 83 | Temp 97.9°F | Ht 66.6 in | Wt 162.6 lb

## 2023-11-18 DIAGNOSIS — G4700 Insomnia, unspecified: Secondary | ICD-10-CM

## 2023-11-18 DIAGNOSIS — G4733 Obstructive sleep apnea (adult) (pediatric): Secondary | ICD-10-CM | POA: Diagnosis not present

## 2023-11-18 NOTE — Patient Instructions (Addendum)
 Benadryl lowest effective dose for sleep. Option of sleep apnea treatment as we discussed.  Thanks for coming today.  I expect the studies on the 13th will be okay but I will keep an eye out for the results.  Take care!

## 2023-11-18 NOTE — Progress Notes (Signed)
 Subjective:  Patient ID: [name], female    DOB: 09-Jul-1951  Age: 73 y.o. MRN: [id number]  CC:  Chief Complaint  Patient presents with   Insomnia    Pt notes she does not need ambien at this time she is taking diphenhydramine HCl 50 MG and this is working     HPI [name] presents for   Follow-up November 2024 visit.  Insomnia Discussed at her November visit.  Fatigue, depression, insomnia was treated with Wellbutrin 300 mg daily.  Some trouble with sleep, daytime somnolence due to sleep difficulty.  Had been using sleepy time tea to get to sleep, but waking up in 2 to 3 hours.  Denied paroxysmal nocturnal dyspnea orthopnea.  Trazodone had caused daytime sedation previously.  Do not like the feeling in the morning after use of Ambien in the past.  We reviewed sleep hygiene at her last visit including use of Phone or Facebook at bedtime.  She did notice some distractibility symptoms at times and concern of memory previously, with reassuring 6 CIT testing, prior neurology referral.  It was thought that her insomnia may be multifactorial with possible component of sleep apnea with snoring.  However thought to be more psychological insomnia.  Hydroxyzine or Tylenol PM for sleep onset discussed.  Referred to sleep specialist.  Labs were reassuring, fatigue likely related to insufficient sleep.  She was evaluated by sleep specialist, Dr. Frances Furbish.  Testing in February indicated mild obstructive sleep apnea.  Treatment optional, including optional AutoPap or consideration of oral appliance.  She declines tx for OSA.  Going to bed earlier. Less phone/ipad.  Did not tolerate hydroxyzine - song in head repetitively after use of that med.  Sleeping 7-10 hrs (prior 4-6 hrs) per night. Using 50mg  diphenhydramine at night. Min sleepiness in am, resolves quickly, not napping or daytime somnolence typically.  Distractibility better with more sleep.  On celebrex for various aches  and pains - past 2 days.   Abnormal mammogram Mammogram on February 27.  Possible mass in the left breast, plan for diagnostic mammogram and ultrasound on February 13.    History Patient Active Problem List   Diagnosis Date Noted   Complete rotator cuff tear of left shoulder 12/15/2022   History of cataract surgery, left 10/23/2022   Acquired trigger finger of right ring finger 06/22/2022   Pain in finger of right hand 06/22/2022   Cataract 06/11/2022   Lumbar radiculopathy 03/16/2022   History of repair of retinal tear by laser photocoagulation 12/21/2021   Cough, persistent 07/10/2021   Globus pharyngeus 07/10/2021   chronic diastolic chf 05/02/2021   Acquired trigger finger of right middle finger 07/14/2020   Squamous cell carcinoma of skin of arm 10/08/2019   Squamous cell carcinoma in situ (SCCIS) of skin 09/18/2019   Double vision 07/12/2019   Age-related vocal fold atrophy 04/30/2019   Muscle tension dysphonia 04/30/2019   Dysphagia 03/04/2019   Hoarseness 01/16/2019   Acquired trigger finger of right index finger 11/04/2018   Trigger finger 09/10/2018   Cervical vertebral fusion 02/14/2018   Cervical post-laminectomy syndrome 11/20/2017   DDD (degenerative disc disease), cervical 11/20/2017   Neck pain 11/20/2017   OA (osteoarthritis) of knee 06/17/2017   History of total knee replacement 06/17/2017   Trochanteric bursitis of left hip 07/03/2015   Routine general medical examination at a health care facility 11/18/2014   CARCINOMA, SKIN, SQUAMOUS CELL 07/06/2010   ACTINIC KERATOSIS 07/06/2010   NEOPLASM, MALIGNANT, BASAL  CELL, CARCINOMA, HX OF 07/06/2010   Hyperlipidemia 06/08/2009   DEPRESSION 06/08/2009   POSTMENOPAUSAL STATUS 06/08/2009   IRRITABLE BOWEL SYNDROME 02/17/2008   Backache 02/17/2008   Migraine headache 02/05/2007   Essential hypertension 02/05/2007   Past Medical History:  Diagnosis Date   Arthritis    knees, hands   Bursitis of left hip     Cancer (HCC)    hx skin cancer   Cataract    Phreesia 09/11/2020   Depression    Depression    Phreesia 09/11/2020   GERD (gastroesophageal reflux disease)    INFREQUENT   Headache    HX MIGRAINES    Heart murmur     MVP 30 yrs ago - no treatment needed   Hyperlipidemia    on statin   Hypertension    "flucuations" has never been on any med   Insomnia    Irritable bowel syndrome    s/p subtotal colectomy '86>diarrhea prone   NEOPLASM, MALIGNANT, BASAL CELL, CARCINOMA, HX OF    Seasonal allergies    Past Surgical History:  Procedure Laterality Date   ABDOMINOPLASTY     ANTERIOR CERVICAL DECOMP/DISCECTOMY FUSION N/A 02/14/2018   Procedure: CERVICAL THREE-FOUR,CERVICAL FOUR-FIVE ACDF, REMOVAL CERVICAL FIVE-SEVEN  PLATE.;  Surgeon: Tia Alert, MD;  Location: [facility] OR;  Service: Neurosurgery;  Laterality: N/A;  anterior   BACK SURGERY  1999   lower back   BREAST ENHANCEMENT SURGERY  1983   BREAST IMPLANT REMOVAL  08/2010   BREAST SURGERY     Augmentation in 1983, Reduction 2011   BUNIONECTOMY     right  and left foot   CERVICAL SPINE SURGERY  12/18/1999   C5/6 and C6/7   CHOLECYSTECTOMY N/A    Phreesia 09/11/2020   COLECTOMY  1986   COLON SURGERY N/A    Phreesia 09/11/2020   COSMETIC SURGERY     EXCISION/RELEASE BURSA HIP Left 07/04/2015   Procedure: LEFT HIP BURSECTOMY WITH ;  Surgeon: Ollen Gross, MD;  Location: [facility] ORS;  Service: Orthopedics;  Laterality: Left;   EYE SURGERY     Lasik 2010   eyelid surgery     FRACTURE SURGERY     HYSTEROSCOPY WITH D & C  08/17/2011   Procedure: DILATATION AND CURETTAGE (D&C) /HYSTEROSCOPY;  Surgeon: Meriel Pica;  Location: [facility] OR[facility];  Service: Gynecology;  Laterality: N/A;   INJECTION KNEE Right 07/04/2015   Procedure: CORTISONE INJECTION RIGHT KNEE ;  Surgeon: Ollen Gross, MD;  Location: [facility];  Service: Orthopedics;  Laterality: Right;   JOINT REPLACEMENT N/A    Phreesia 09/11/2020   KNEE ARTHROSCOPY     right x 2    KNEE RECONSTRUCTION, MEDIAL PATELLAR FEMORAL LIGAMENT     right   LASIK  2009    bilateral   left foot surgery     removed foreign object   OPEN SURGICAL REPAIR OF GLUTEAL TENDON Left 07/04/2015   Procedure: GLUTEAL TENDON REPAIR ;  Surgeon: Ollen Gross, MD;  Location: [facility];  Service: Orthopedics;  Laterality: Left;   REDUCTION MAMMAPLASTY Bilateral    SPINE SURGERY N/A    Phreesia 09/11/2020   thumb surgery     TOTAL KNEE ARTHROPLASTY Right 06/17/2017   Procedure: RIGHT TOTAL KNEE ARTHROPLASTY;  Surgeon: Ollen Gross, MD;  Location: [facility];  Service: Orthopedics;  Laterality: Right;   TRIGGER FINGER RELEASE Right 09/11/2021   TUBAL LIGATION     Allergies  Allergen Reactions   Lactose Intolerance (Gi) Diarrhea  Nsaids Other (See Comments)    Hurts stomach   Other Rash    Retinol cream   Retinoids Hives, Itching and Rash   Prior to Admission medications   Medication Sig Start Date End Date Taking? Authorizing Provider  acetaminophen (TYLENOL) 500 MG tablet Take 1,000 mg by mouth every 8 (eight) hours as needed for moderate pain or headache.    Yes [provider]  atorvastatin (LIPITOR) 20 MG tablet Take 1 tablet (20 mg total) by mouth every morning. 08/01/23  Yes Shade Flood, MD  buPROPion (WELLBUTRIN XL) 300 MG 24 hr tablet Take 1 tablet (300 mg total) by mouth daily. 08/01/23  Yes Shade Flood, MD  celecoxib (CELEBREX) 200 MG capsule Take 200 mg by mouth daily. 11/15/23  Yes [provider]  cholecalciferol (VITAMIN D3) 25 MCG (1000 UNIT) tablet Take by mouth. 03/04/19  Yes [provider]  Cyanocobalamin (VITAMIN B12) 1000 MCG TBCR Take 1,000 mcg by mouth daily.   Yes [provider]  diclofenac sodium (VOLTAREN) 1 % GEL Apply 1 g topically daily as needed.    Yes [provider]  diphenhydramine-acetaminophen (TYLENOL PM EXTRA STRENGTH) 25-500 MG TABS tablet Take 1 tablet by mouth at bedtime as needed.   Yes [provider]  fluorouracil (EFUDEX) 5 % cream fluorouracil 5 % topical cream 03/04/19  Yes [provider]  fluticasone (FLONASE) 50 MCG/ACT nasal spray  07/10/21  Yes [provider]  furosemide (LASIX) 40 MG tablet TAKE 1 TABLET DAILY OR AS ADVISED BY CARDIOLOGY 11/11/23  Yes Sheliah Hatch, MD  HYDROcodone-acetaminophen (NORCO/VICODIN) 5-325 MG tablet Take 1 tablet by mouth every 6 (six) hours as needed. 11/08/20  Yes [provider]  Liniments (SALONPAS PAIN RELIEF PATCH EX) Apply 2 patches topically daily.   Yes [provider]  loperamide (IMODIUM A-D) 2 MG tablet Take 1 tablet (2 mg total) by mouth 3 (three) times daily as needed for diarrhea or loose stools. Patient taking differently: Take 2 mg by mouth 2 (two) times daily. 04/13/19  Yes Stallings, Zoe A, MD  loratadine (CLARITIN) 10 MG tablet Take 10 mg by mouth daily.   Yes [provider]  MAGNESIUM GLYCINATE PO Take 450 mg by mouth.   Yes [provider]  niacinamide 500 MG tablet Take 500 mg by mouth 2 (two) times daily with a meal.   Yes [provider]  potassium chloride (KLOR-CON) 10 MEQ tablet Take 1 tablet (10 mEq total) by mouth daily as needed (with doing of furosemide.). 08/01/23  Yes Shade Flood, MD  promethazine (PHENERGAN) 25 MG tablet Take 0.5-1 tablets (12.5-25 mg total) by mouth every 6 (six) hours as needed for nausea or vomiting (headache). 08/01/23  Yes Shade Flood, MD  Simethicone (PHAZYME PO) Take 1 tablet by mouth every morning.    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  Retired.  Education some college.  2 children.  Caffeine 3-4 glasses of tea/ day.   Social Drivers of Corporate investment banker Strain: Low Risk  (07/29/2023)   Overall Financial Resource Strain (CARDIA)    Difficulty of Paying Living Expenses: Not hard at all  Food Insecurity: Low Risk  (10/16/2023)   Received from [facility]   Hunger Vital Sign    Worried About Running Out of Food in the Last Year: Never true    Ran Out of Food in the Last Year: Never true  Transportation Needs: No Transportation Needs (10/16/2023)   Received from [facility]    In the past 12 months, has lack of reliable transportation kept you from medical appointments, meetings, work or from getting things needed for daily living? : No  Physical Activity: Sufficiently Active (07/29/2023)   Exercise Vital Sign    Days of Exercise per Week: 1 day    Minutes of Exercise per Session: 150+ min  Stress: No Stress Concern Present (07/29/2023)   [employer] of Occupational Health - Occupational Stress Questionnaire    Feeling of Stress : Not at all  Social Connections: Socially Integrated (07/29/2023)   Social Connection and Isolation Panel [NHANES]    Frequency of Communication with Friends and Family: More than three times a week    Frequency of Social Gatherings with Friends and Family: Three times a week    Attends Religious Services: 1 to 4 times per year    Active Member of Clubs or Organizations: Yes    Attends Banker Meetings: More than 4 times per year    Marital Status: Married  Careers information officer Violence: Not At Risk (07/18/2022)   Humiliation, Afraid, Rape, and Kick questionnaire    Fear of Current or Ex-Partner: No    Emotionally Abused: No    Physically Abused: No    Sexually Abused: No    Review of Systems   Objective:   Vitals:   11/18/23 1455  BP: 126/60  Pulse: 83  Temp: 97.9 F (36.6 C)  TempSrc: Temporal  SpO2: 99%  Weight: 162 lb 9.6 oz (73.8 kg)  Height: 5' 6.6" (1.692 m)     Physical Exam Constitutional:      General: She is not in acute distress.    Appearance: Normal appearance. She is well-developed.  HENT:     Head: Normocephalic and atraumatic.  Cardiovascular:     Rate and Rhythm: Normal rate.  Pulmonary:     Effort: Pulmonary effort is normal.  Neurological:     Mental Status: She is alert and oriented to person, place, and time.  Psychiatric:        Mood and Affect: Mood normal.        Assessment & Plan:  Halley Shepheard is a 73 y.o. female . Insomnia, unspecified type  OSA (obstructive sleep apnea) Mild OSA with option to treat as above.  Defers treatment at this time.  Sleep has been better with use of diphenhydramine.  Lowest effective dose discussed, continue sleep hygiene including avoidance of electronic media in the evening.  Sleepy time tea has also been helpful.  Recent start of  Celebrex by Ortho may help some of her various arthralgias which likely also contributes to sleep issues.  Recheck in 3 months for medication review and labs.  Sooner if needed.   No orders of the defined types were placed in this encounter.  Patient Instructions  Benadryl lowest effective dose for sleep. Option of sleep apnea treatment as we discussed.  Thanks for coming today.  I expect the studies on the 13th will be okay but I will keep an eye out for the results.  Take care!    Signed,   Meredith Staggers, MD [facility], [facility] 11/18/23 3:36 PM

## 2023-11-21 ENCOUNTER — Ambulatory Visit
Admission: RE | Admit: 2023-11-21 | Discharge: 2023-11-21 | Disposition: A | Discharge status: Home / Self Care | Source: Ambulatory Visit | Attending: Family Medicine | Admitting: Family Medicine

## 2023-11-21 DIAGNOSIS — R928 Other abnormal and inconclusive findings on diagnostic imaging of breast: Secondary | ICD-10-CM

## 2023-11-21 DIAGNOSIS — N6489 Other specified disorders of breast: Secondary | ICD-10-CM | POA: Diagnosis not present

## 2023-11-21 DIAGNOSIS — N6002 Solitary cyst of left breast: Secondary | ICD-10-CM | POA: Diagnosis not present

## 2023-12-10 DIAGNOSIS — D485 Neoplasm of uncertain behavior of skin: Secondary | ICD-10-CM | POA: Diagnosis not present

## 2023-12-10 DIAGNOSIS — C44519 Basal cell carcinoma of skin of other part of trunk: Secondary | ICD-10-CM | POA: Diagnosis not present

## 2023-12-16 DIAGNOSIS — M19012 Primary osteoarthritis, left shoulder: Secondary | ICD-10-CM | POA: Diagnosis not present

## 2023-12-16 DIAGNOSIS — M25511 Pain in right shoulder: Secondary | ICD-10-CM | POA: Diagnosis not present

## 2024-01-02 ENCOUNTER — Telehealth: Payer: Self-pay

## 2024-01-02 NOTE — Telephone Encounter (Signed)
 Copied from CRM [id number]. Topic: General - Other >> Jan 02, 2024  8:40 AM [name] wrote: Reason for CRM: patient is needing a call back regarding her shoulder replacement surgery

## 2024-01-03 NOTE — Telephone Encounter (Signed)
 Yes, this sounds like a surgical clearance/preop eval request.  Typically the surgeon will send over a request on paperwork which then prompts scheduling that visit.  Okay to call the orthopedic office and request that paperwork if it has not yet been sent and please schedule a preop evaluation appointment.  We can discuss specific testing needed at that time for medical/cardiac clearance.  Thanks.

## 2024-01-03 NOTE — Telephone Encounter (Signed)
 Called patient to clarify exactly what she was needing regarding her shoulder replacement surgery. Patient stated the Doctor who is working with her for her surgery is wanting approval from PCP before surgery is scheduled. Patient was last seen by Dr.Greene on 11/18/2023.   Is patient needing to come in for a surgical clarence appointment? Please advise

## 2024-01-06 ENCOUNTER — Telehealth: Payer: Self-pay | Admitting: Family Medicine

## 2024-01-06 ENCOUNTER — Ambulatory Visit (INDEPENDENT_AMBULATORY_CARE_PROVIDER_SITE_OTHER): Admitting: Family Medicine

## 2024-01-06 VITALS — BP 134/80 | HR 78 | Temp 98.2°F | Ht 66.6 in | Wt 162.0 lb

## 2024-01-06 DIAGNOSIS — Z01818 Encounter for other preprocedural examination: Secondary | ICD-10-CM

## 2024-01-06 LAB — CBC
HCT: 44.4 % (ref 36.0–46.0)
Hemoglobin: 14.7 g/dL (ref 12.0–15.0)
MCHC: 33.1 g/dL (ref 30.0–36.0)
MCV: 95.1 fl (ref 78.0–100.0)
Platelets: 250 10*3/uL (ref 150.0–400.0)
RBC: 4.68 Mil/uL (ref 3.87–5.11)
RDW: 13.6 % (ref 11.5–15.5)
WBC: 6.6 10*3/uL (ref 4.0–10.5)

## 2024-01-06 LAB — BASIC METABOLIC PANEL WITH GFR
BUN: 19 mg/dL (ref 6–23)
CO2: 28 meq/L (ref 19–32)
Calcium: 10.2 mg/dL (ref 8.4–10.5)
Chloride: 105 meq/L (ref 96–112)
Creatinine, Ser: 0.73 mg/dL (ref 0.40–1.20)
GFR: 81.48 mL/min (ref >60.00)
Glucose, Bld: 91 mg/dL (ref 70–99)
Potassium: 4 meq/L (ref 3.5–5.1)
Sodium: 142 meq/L (ref 135–145)

## 2024-01-06 NOTE — Telephone Encounter (Signed)
 Pt appt this afternoon at 1:00pm

## 2024-01-06 NOTE — Patient Instructions (Signed)
 Thank you for coming in today.  I will check some lab work for your planned surgery and once I have those results we will complete the paperwork and send that to Dr. Leandro Proffer office.  I do not see any specific concerns regarding surgery at this time but I will notate on the chart about the mild sleep apnea.  I do not expect that to be an issue, but do need to advise the anesthesia staff.  Good luck with surgery and please let us  know if there are any questions.

## 2024-01-06 NOTE — Telephone Encounter (Signed)
 error

## 2024-01-06 NOTE — Telephone Encounter (Signed)
 [facility] faxed over this pt "PREOP CLEARANCE FORM". Paperwork handed to provider.  Please advise, Thanks

## 2024-01-06 NOTE — Telephone Encounter (Addendum)
 Noted, discussed with [name] this morning.  Unfortunately we did not receive any paperwork and the first notification of this was on Friday.  Once I received this info I  sent a message back to clinical pool to contact patient, find out her orthopedist so we could request that paperwork directly and then schedule her for a preop evaluation.  [name] was doing just that this morning.  Unfortunately there is no record of preop clearance form sent from her orthopedist.  I will discuss the workflow with patient at her appointment today.  Fortunately we did have a cancellation at 1:00 and I asked staff to book her then  to help with obtaining this preop eval and completion of paperwork.

## 2024-01-06 NOTE — Telephone Encounter (Signed)
 Called patient to let her know I did call [facility] to have the surgical clearance paper refaxed and the soonest we could see her is may 5th at 9:00am. Patient wanted to be seen today due to this pushing her surgery back even more and she has trips planned in the future.   Patient states " this is ridiculous, [facility] stated they sent over the paper work 3 weeks ago, what if this was a life threatening surgery. Maybe I need to change Dr. Since no one came seem to get me in in a timely manner. I should not have to jump through hoops like this. How can yall be so incompetent to get paper work from on provider to another these days. Sorry I do not mean to take this out on you but I just do not understand."   I did reassured patient that sometimes we do not receive faxes that supposedly were sent to us  and we would have no way to check this if we were unaware they were coming to us .

## 2024-01-06 NOTE — Progress Notes (Unsigned)
 Subjective:  Patient ID: [name], female    DOB: 07/09/1951  Age: 73 y.o. MRN: [id number]  CC:  Chief Complaint  Patient presents with   Medical Clearance    Lt shoulder replacement, pt notes no date set waiting on clearance     HPI [name] presents for   Preoperative evaluation She has been followed by Dr. Alfredo Ano with [facility], with left shoulder pain.  Will be proceeding with left anatomic shoulder arthroplasty per preoperative clearance form received today.  Date of surgery to be determined.  Surgery will be at [facility] but not specified whether or not they are ordering labs or if I will order labs.  She plans on surgery around June 26th.   Most recent labs in December.  Normal CBC at that time, glucose 100, otherwise electrolytes were normal.  She is not on any blood thinners or aspirin.  Able to walk 2 flights of stairs without dyspnea or shortness of breath as well as city block.  History of mild obstructive sleep apnea, AHI 5.8%,  option of CPAP, decided against treatment. Rare daytime somnolence. Sleeping 8 hours with sleep aid - otc and sleepytime tea.   No known hx of ischemic heart disease, CHF, CVD/TIA/CVA, no use of insulin, and no CKD - last GFR 74 on 08/12/23 with creatinine 0.79. RCRI index score 3.9%, 0 points.   Last EKG in 2022.   History Patient Active Problem List   Diagnosis Date Noted   Complete rotator cuff tear of left shoulder 12/15/2022   History of cataract surgery, left 10/23/2022   Acquired trigger finger of right ring finger 06/22/2022   Pain in finger of right hand 06/22/2022   Cataract 06/11/2022   Lumbar radiculopathy 03/16/2022   History of repair of retinal tear by laser photocoagulation 12/21/2021   Cough, persistent 07/10/2021   Globus pharyngeus 07/10/2021   chronic diastolic chf 05/02/2021   Acquired trigger finger of right middle finger 07/14/2020   Squamous cell carcinoma of skin of arm 10/08/2019    Squamous cell carcinoma in situ (SCCIS) of skin 09/18/2019   Double vision 07/12/2019   Age-related vocal fold atrophy 04/30/2019   Muscle tension dysphonia 04/30/2019   Dysphagia 03/04/2019   Hoarseness 01/16/2019   Acquired trigger finger of right index finger 11/04/2018   Trigger finger 09/10/2018   Cervical vertebral fusion 02/14/2018   Cervical post-laminectomy syndrome 11/20/2017   DDD (degenerative disc disease), cervical 11/20/2017   Neck pain 11/20/2017   OA (osteoarthritis) of knee 06/17/2017   History of total knee replacement 06/17/2017   Trochanteric bursitis of left hip 07/03/2015   Routine general medical examination at a health care facility 11/18/2014   CARCINOMA, SKIN, SQUAMOUS CELL 07/06/2010   ACTINIC KERATOSIS 07/06/2010   NEOPLASM, MALIGNANT, BASAL CELL, CARCINOMA, HX OF 07/06/2010   Hyperlipidemia 06/08/2009   DEPRESSION 06/08/2009   POSTMENOPAUSAL STATUS 06/08/2009   IRRITABLE BOWEL SYNDROME 02/17/2008   Backache 02/17/2008   Migraine headache 02/05/2007   Essential hypertension 02/05/2007   Past Medical History:  Diagnosis Date   Arthritis    knees, hands   Bursitis of left hip    Cancer (HCC)    hx skin cancer   Cataract    Phreesia 09/11/2020   Depression    Depression    Phreesia 09/11/2020   GERD (gastroesophageal reflux disease)    INFREQUENT   Headache    HX MIGRAINES    Heart murmur     MVP 30  yrs ago - no treatment needed   Hyperlipidemia    on statin   Hypertension    "flucuations" has never been on any med   Insomnia    Irritable bowel syndrome    s/p subtotal colectomy '86>diarrhea prone   NEOPLASM, MALIGNANT, BASAL CELL, CARCINOMA, HX OF    Seasonal allergies    Past Surgical History:  Procedure Laterality Date   ABDOMINOPLASTY     ANTERIOR CERVICAL DECOMP/DISCECTOMY FUSION N/A 02/14/2018   Procedure: CERVICAL THREE-FOUR,CERVICAL FOUR-FIVE ACDF, REMOVAL CERVICAL FIVE-SEVEN  PLATE.;  Surgeon: Isadora Mar, MD;  Location:  [facility] OR;  Service: Neurosurgery;  Laterality: N/A;  anterior   BACK SURGERY  1999   lower back   BREAST ENHANCEMENT SURGERY  1983   BREAST IMPLANT REMOVAL  08/2010   BREAST SURGERY     Augmentation in 1983, Reduction 2011   BUNIONECTOMY     right  and left foot   CERVICAL SPINE SURGERY  12/18/1999   C5/6 and C6/7   CHOLECYSTECTOMY N/A    Phreesia 09/11/2020   COLECTOMY  1986   COLON SURGERY N/A    Phreesia 09/11/2020   COSMETIC SURGERY     EXCISION/RELEASE BURSA HIP Left 07/04/2015   Procedure: LEFT HIP BURSECTOMY WITH ;  Surgeon: Liliane Rei, MD;  Location: [facility];  Service: Orthopedics;  Laterality: Left;   EYE SURGERY     Lasik 2010   eyelid surgery     FRACTURE SURGERY     HYSTEROSCOPY WITH D & C  08/17/2011   Procedure: DILATATION AND CURETTAGE (D&C) /HYSTEROSCOPY;  Surgeon: Piedad Brewer;  Location: [facility];  Service: Gynecology;  Laterality: N/A;   INJECTION KNEE Right 07/04/2015   Procedure: CORTISONE INJECTION RIGHT KNEE ;  Surgeon: Liliane Rei, MD;  Location: [facility];  Service: Orthopedics;  Laterality: Right;   JOINT REPLACEMENT N/A    Phreesia 09/11/2020   KNEE ARTHROSCOPY     right x 2   KNEE RECONSTRUCTION, MEDIAL PATELLAR FEMORAL LIGAMENT     right   LASIK  2009    bilateral   left foot surgery     removed foreign object   OPEN SURGICAL REPAIR OF GLUTEAL TENDON Left 07/04/2015   Procedure: GLUTEAL TENDON REPAIR ;  Surgeon: Liliane Rei, MD;  Location: [facility];  Service: Orthopedics;  Laterality: Left;   REDUCTION MAMMAPLASTY Bilateral    SPINE SURGERY N/A    Phreesia 09/11/2020   thumb surgery     TOTAL KNEE ARTHROPLASTY Right 06/17/2017   Procedure: RIGHT TOTAL KNEE ARTHROPLASTY;  Surgeon: Liliane Rei, MD;  Location: [facility];  Service: Orthopedics;  Laterality: Right;   TRIGGER FINGER RELEASE Right 09/11/2021   TUBAL LIGATION     Allergies  Allergen Reactions   Lactose Intolerance (Gi) Diarrhea   Nsaids Other (See Comments)    Hurts stomach    Other Rash    Retinol cream   Retinoids Hives, Itching and Rash   Prior to Admission medications   Medication Sig Start Date End Date Taking? Authorizing Provider  acetaminophen  (TYLENOL ) 500 MG tablet Take 1,000 mg by mouth every 8 (eight) hours as needed for moderate pain or headache.     [provider]  atorvastatin  (LIPITOR) 20 MG tablet Take 1 tablet (20 mg total) by mouth every morning. 08/01/23   Benjiman Bras, MD  buPROPion  (WELLBUTRIN  XL) 300 MG 24 hr tablet Take 1 tablet (300 mg total) by mouth daily. 08/01/23   Benjiman Bras,  MD  celecoxib (CELEBREX) 200 MG capsule Take 200 mg by mouth daily. 11/15/23   [provider]  cholecalciferol (VITAMIN D3) 25 MCG (1000 UNIT) tablet Take by mouth. 03/04/19   [provider]  Cyanocobalamin (VITAMIN B12) 1000 MCG TBCR Take 1,000 mcg by mouth daily.    [provider]  diclofenac sodium (VOLTAREN) 1 % GEL Apply 1 g topically daily as needed.     [provider]  diphenhydramine -acetaminophen  (TYLENOL  PM EXTRA STRENGTH) 25-500 MG TABS tablet Take 1 tablet by mouth at bedtime as needed.    [provider]  fluorouracil (EFUDEX) 5 % cream fluorouracil 5 % topical cream 03/04/19   [provider]  fluticasone Odis Bennetts) 50 MCG/ACT nasal spray  07/10/21   [provider]  furosemide  (LASIX ) 40 MG tablet TAKE 1 TABLET DAILY OR AS ADVISED BY CARDIOLOGY 11/11/23   Tabori, Katherine E, MD  HYDROcodone -acetaminophen  (NORCO/VICODIN) 5-325 MG tablet Take 1 tablet by mouth every 6 (six) hours as needed. 11/08/20   [provider]  Liniments (SALONPAS PAIN RELIEF PATCH EX) Apply 2 patches topically daily.    [provider]  loperamide  (IMODIUM  A-D) 2 MG tablet Take 1 tablet (2 mg total) by mouth 3 (three) times daily as needed for diarrhea or loose stools. Patient taking differently: Take 2 mg by mouth 2 (two) times daily. 04/13/19   Stallings, Zoe A, MD  loratadine   (CLARITIN ) 10 MG tablet Take 10 mg by mouth daily.    [provider]  MAGNESIUM GLYCINATE PO Take 450 mg by mouth.    [provider]  niacinamide  500 MG tablet Take 500 mg by mouth 2 (two) times daily with a meal.    [provider]  potassium chloride  (KLOR-CON ) 10 MEQ tablet Take 1 tablet (10 mEq total) by mouth daily as needed (with doing of furosemide .). 08/01/23   Benjiman Bras, MD  promethazine  (PHENERGAN ) 25 MG tablet Take 0.5-1 tablets (12.5-25 mg total) by mouth every 6 (six) hours as needed for nausea or vomiting (headache). 08/01/23   Benjiman Bras, MD  Simethicone (PHAZYME PO) Take 1 tablet by mouth every morning.     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  Retired.  Education some college.  2 children.  Caffeine 3-4 glasses of tea/ day.   Social Drivers of Corporate investment banker Strain: Low Risk  (01/06/2024)   Overall Financial Resource Strain (CARDIA)    Difficulty of Paying Living Expenses: Not hard at all  Food Insecurity: No Food Insecurity (01/06/2024)   Hunger Vital Sign    Worried About Running Out  of Food in the Last Year: Never true    Ran Out of Food in the Last Year: Never true   Transportation Needs: No Transportation Needs (01/06/2024)   PRAPARE - Administrator, Civil Service (Medical): No    Lack of Transportation (Non-Medical): No  Physical Activity: Unknown (01/06/2024)   Exercise Vital Sign    Days of Exercise per Week: Patient declined    Minutes of Exercise per Session: 150+ min  Stress: No Stress Concern Present (01/06/2024)   [employer] of Occupational Health - Occupational Stress Questionnaire    Feeling of Stress : Only a little  Social Connections: Socially Integrated (01/06/2024)   Social Connection and Isolation Panel [NHANES]    Frequency of Communication with Friends and Family: More than three times a week    Frequency of Social Gatherings with Friends and Family: Twice a week    Attends Religious Services: 1 to 4 times per year    Active Member of [employer] or Organizations: Yes    Attends Engineer, structural: More than 4 times per year    Marital Status: Married  Catering manager Violence: Not At Risk (07/18/2022)   Humiliation, Afraid, Rape, and Kick questionnaire    Fear of Current or Ex-Partner: No    Emotionally Abused: No    Physically Abused: No    Sexually Abused: No    Review of Systems   Objective:   Vitals:   01/06/24 1309  BP: (!) 142/80  Pulse: 78  Temp: 98.2 F (36.8 C)  TempSrc: Temporal  SpO2: 98%  Weight: 162 lb (73.5 kg)  Height: 5' 6.6" (1.692 m)     Physical Exam Vitals reviewed.  Constitutional:      Appearance: Normal appearance. She is well-developed.  HENT:     Head: Normocephalic and atraumatic.  Eyes:     Conjunctiva/sclera: Conjunctivae normal.     Pupils: Pupils are equal, round, and reactive to light.  Neck:     Vascular: No carotid bruit.  Cardiovascular:     Rate and Rhythm: Normal rate and regular rhythm.     Heart sounds: Normal heart sounds.  Pulmonary:     Effort: Pulmonary effort is normal.     Breath sounds: Normal breath sounds.  Abdominal:      Palpations: Abdomen is soft. There is no pulsatile mass.     Tenderness: There is no abdominal tenderness.  Musculoskeletal:     Right lower leg: No edema.     Left lower leg: No edema.  Skin:    General: Skin is warm and dry.  Neurological:     Mental Status: She is alert and oriented to person, place, and time.  Psychiatric:        Mood and Affect: Mood normal.        Behavior: Behavior normal.    EKG, sinus rhythm, rate 68, PR 146, QTc 438, no apparent acute ST or T wave changes.  Compared to 05/02/2021, no apparent significant changes.  Assessment & Plan:  [name] is a 73 y.o. female . Preoperative evaluation to rule out surgical contraindication - Plan: CBC, Basic metabolic panel with GFR, EKG 12-Lead Appears to be at acceptable risk for planned procedure as above.  Labs obtained, we will then complete paperwork for surgeon.  She has been diagnosed with sleep apnea but mild and denies any significant daytime symptoms.  Will notate this on her form but no current treatment at this time.  No orders of the defined types were placed in this encounter.  Patient Instructions  Thank you for coming in today.  I will check some lab work for your planned surgery and once I have those results we will complete the paperwork and send that to Dr. Leandro Proffer office.  I do not see any specific concerns regarding surgery at this time but I will notate on the chart about the mild sleep apnea.  I do not expect that to be an issue, but do need to advise the anesthesia staff.  Good luck with surgery and please let us  know if there are any questions.    Signed,   Caro Christmas, MD [facility], [facility] 01/06/24 1:12 PM

## 2024-01-06 NOTE — Telephone Encounter (Signed)
 I have paperwork and will review at her preop appointment today at 1 PM.  Other phone note reviewed as well, and will discuss with patient at preop appointment today.

## 2024-01-07 DIAGNOSIS — M25552 Pain in left hip: Secondary | ICD-10-CM | POA: Diagnosis not present

## 2024-01-08 ENCOUNTER — Telehealth: Payer: Self-pay

## 2024-01-08 ENCOUNTER — Encounter: Payer: Self-pay | Admitting: Family Medicine

## 2024-01-08 ENCOUNTER — Telehealth: Payer: Self-pay | Admitting: Family Medicine

## 2024-01-08 NOTE — Telephone Encounter (Signed)
 This has been faxed to their requested fax number we were waiting on lab results to be returned

## 2024-01-08 NOTE — Telephone Encounter (Signed)
 Copied from CRM [id number]. Topic: General - Other >> Jan 08, 2024 10:15 AM Clydene Darner H wrote: Reason for CRM: Christiane Cowing from [facility] called this morning to confirm if the medical clearance for the patient was faxed over on 04/28 and again this morning, has been received.   Callback Number: [phone]

## 2024-01-08 NOTE — Telephone Encounter (Signed)
 PreOp clearance form was sent from [facility] and needs signatures and sent back to its designated place. Paper work is placed in Chartered loss adjuster.  Please advise, Thanks  Fax #-- [phone]

## 2024-01-08 NOTE — Telephone Encounter (Signed)
 Preop form completed and given to [facility].

## 2024-01-09 NOTE — Telephone Encounter (Signed)
 Papers have been placed in sign folder

## 2024-01-09 NOTE — Telephone Encounter (Signed)
 Preop eval appointment on Monday, paperwork was completed and given to [facility] to fax yesterday.  As she is not here today, can we please call [facility] and make sure they have received any necessary paperwork?

## 2024-01-10 NOTE — Telephone Encounter (Signed)
 This has already been faxed back to [facility] twice now, no further actions needed

## 2024-01-13 ENCOUNTER — Ambulatory Visit: Admitting: Family Medicine

## 2024-01-14 DIAGNOSIS — C44519 Basal cell carcinoma of skin of other part of trunk: Secondary | ICD-10-CM | POA: Diagnosis not present

## 2024-02-19 ENCOUNTER — Ambulatory Visit (INDEPENDENT_AMBULATORY_CARE_PROVIDER_SITE_OTHER): Admitting: Family Medicine

## 2024-02-19 ENCOUNTER — Encounter: Payer: Self-pay | Admitting: Family Medicine

## 2024-02-19 VITALS — BP 136/66 | HR 70 | Temp 98.7°F | Resp 15 | Ht 66.6 in | Wt 159.4 lb

## 2024-02-19 DIAGNOSIS — R600 Localized edema: Secondary | ICD-10-CM

## 2024-02-19 DIAGNOSIS — Z8669 Personal history of other diseases of the nervous system and sense organs: Secondary | ICD-10-CM

## 2024-02-19 DIAGNOSIS — F339 Major depressive disorder, recurrent, unspecified: Secondary | ICD-10-CM | POA: Diagnosis not present

## 2024-02-19 DIAGNOSIS — I10 Essential (primary) hypertension: Secondary | ICD-10-CM | POA: Diagnosis not present

## 2024-02-19 DIAGNOSIS — E782 Mixed hyperlipidemia: Secondary | ICD-10-CM | POA: Diagnosis not present

## 2024-02-19 DIAGNOSIS — I5189 Other ill-defined heart diseases: Secondary | ICD-10-CM | POA: Diagnosis not present

## 2024-02-19 DIAGNOSIS — G4700 Insomnia, unspecified: Secondary | ICD-10-CM

## 2024-02-19 MED ORDER — ATORVASTATIN CALCIUM 20 MG PO TABS: 20.0000 mg | ORAL_TABLET | Freq: Every morning | ORAL | 3 refills | Status: AC

## 2024-02-19 MED ORDER — POTASSIUM CHLORIDE ER 10 MEQ PO TBCR: 10.0000 meq | EXTENDED_RELEASE_TABLET | Freq: Every day | ORAL | 2 refills | Status: AC | PRN

## 2024-02-19 MED ORDER — BUPROPION HCL ER (XL) 300 MG PO TB24: 300.0000 mg | ORAL_TABLET | Freq: Every day | ORAL | 3 refills | Status: AC

## 2024-02-19 NOTE — Patient Instructions (Signed)
 Thank you for coming in today.  No medication changes at this time.  If any concerns on labs I will let you know.  Have a great trip sailing!

## 2024-02-19 NOTE — Progress Notes (Signed)
 Subjective:  Patient ID: [name], female    DOB: July 20, 1951  Age: 73 y.o. MRN: [id number]  CC:  Chief Complaint  Patient presents with   Medical Management of Chronic Issues    Pt is doing well, no concerns     HPI [name] presents for  Follow-up of chronic conditions, she did undergo a preop eval in April acceptable risk at that time for shoulder arthroplasty. Celebrex working well - postponing surgery for now.  Followed by ortho for hip/back.   Insomnia See prior notes.  Discussed fatigue, depression and insomnia previously, Wellbutrin  300 mg daily.  Home treatments with some benefit but waking up early.  Trazodone  caused daytime somnolence.  Side effects with Ambien in the past.  Sleep hygiene had been discussed.  Some concerns with distractibility but reassuring memory testing and referred to neuro previously.  She was evaluated by sleep specialist, with testing indicating only mild OSA with option of AutoPap or consideration of oral appliance.  She had decided against treatment of the mild OSA.  Did not tolerate hydroxyzine , but was overall doing well with 50 mg of diphenhydramine  at night when discussed in March. Benadryl  and sleepytime tea are still working well. Still declines tx for OSA Mood stable on wellbutrin .      02/19/2024    9:49 AM 01/06/2024    1:07 PM 08/01/2023   10:34 AM 01/21/2023   10:49 AM 07/26/2022    1:57 PM  Depression screen PHQ 2/9  Decreased Interest 0 0 0 0 0  Down, Depressed, Hopeless 0 0 0 0 0  PHQ - 2 Score 0 0 0 0 0  Altered sleeping 1 0 3 2 3   Tired, decreased energy 0 0 1 2 1   Change in appetite 0 0 0 1 0  Feeling bad or failure about yourself  0 0 0 0 0  Trouble concentrating 1 0 3 1 1   Moving slowly or fidgety/restless 0 0 0 0 0  Suicidal thoughts 0 0 0 0 0  PHQ-9 Score 2 0 7 6 5   Difficult doing work/chores Not difficult at all   Not difficult at all       02/19/2024    9:50 AM 01/06/2024    1:08 PM  08/01/2023   10:35 AM 01/21/2023   10:50 AM  GAD 7 : Generalized Anxiety Score  Nervous, Anxious, on Edge 1 0 1 0  Control/stop worrying 0 0 0 0  Worry too much - different things 0 0 0 0  Trouble relaxing 0 0 0 0  Restless 0 0 0 0  Easily annoyed or irritable 0 0 1 0  Afraid - awful might happen 0 0 0 0  Total GAD 7 Score 1 0 2 0  Anxiety Difficulty Not difficult at all   Not difficult at all    Hypertension: With history of chronic diastolic CHF, Grade 1 DD on echo in 2022. hypertensive heart disease, seen by cardiology previously, treated with Lasix  40 mg 4 days a week with potassium. Still working well with this dosing. No med side effects.  Home readings:none BP Readings from Last 3 Encounters:  02/19/24 136/66  01/06/24 134/80  11/18/23 126/60   Lab Results  Component Value Date   CREATININE 0.73 01/06/2024   History of migraine Chronic, no daily meds needed, Phenergan  every few months with early onset of headache which relieves headache after quarter to half pill.  Denies recent change in headache frequency or  intensity. No recent migraine.   Hyperlipidemia: Lipitor 20 mg daily, no new side effects.  Lab Results  Component Value Date   CHOL 145 08/01/2023   HDL 54.70 08/01/2023   LDLCALC 70 08/01/2023   TRIG 99.0 08/01/2023   CHOLHDL 3 08/01/2023   Lab Results  Component Value Date   ALT 18 08/01/2023   AST 21 08/01/2023   ALKPHOS 56 08/01/2023   BILITOT 0.6 08/01/2023    History Patient Active Problem List   Diagnosis Date Noted   Pain in joint of left shoulder 01/31/2023   Complete rotator cuff tear of left shoulder 12/15/2022   History of cataract surgery, left 10/23/2022   Acquired trigger finger of right ring finger 06/22/2022   Pain in finger of right hand 06/22/2022   Cataract 06/11/2022   Hemorrhoid prolapse 04/10/2022   Lumbar radiculopathy 03/16/2022   History of repair of retinal tear by laser photocoagulation 12/21/2021   Cough, persistent  07/10/2021   Globus pharyngeus 07/10/2021   chronic diastolic chf 05/02/2021   Acquired trigger finger of right middle finger 07/14/2020   Squamous cell carcinoma of skin of arm 10/08/2019   Squamous cell carcinoma in situ (SCCIS) of skin 09/18/2019   Double vision 07/12/2019   Age-related vocal fold atrophy 04/30/2019   Muscle tension dysphonia 04/30/2019   Dysphagia 03/04/2019   Hoarseness 01/16/2019   Acquired trigger finger of right index finger 11/04/2018   Trigger finger 09/10/2018   Cervical vertebral fusion 02/14/2018   Cervical post-laminectomy syndrome 11/20/2017   DDD (degenerative disc disease), cervical 11/20/2017   Neck pain 11/20/2017   OA (osteoarthritis) of knee 06/17/2017   History of total knee replacement 06/17/2017   Trochanteric bursitis of left hip 07/03/2015   Routine general medical examination at a health care facility 11/18/2014   CARCINOMA, SKIN, SQUAMOUS CELL 07/06/2010   ACTINIC KERATOSIS 07/06/2010   NEOPLASM, MALIGNANT, BASAL CELL, CARCINOMA, HX OF 07/06/2010   Hyperlipidemia 06/08/2009   DEPRESSION 06/08/2009   POSTMENOPAUSAL STATUS 06/08/2009   IRRITABLE BOWEL SYNDROME 02/17/2008   Backache 02/17/2008   Migraine headache 02/05/2007   Essential hypertension 02/05/2007   Past Medical History:  Diagnosis Date   Arthritis    knees, hands   Bursitis of left hip    Cancer (HCC)    hx skin cancer   Cataract    Phreesia 09/11/2020   Depression    Depression    [employer]reesia 09/11/2020   GERD (gastroesophageal reflux disease)    INFREQUENT   Headache    HX MIGRAINES    Heart murmur     MVP 30 yrs ago - no treatment needed   Hyperlipidemia    on statin   Hypertension    flucuations has never been on any med   Insomnia    Irritable bowel syndrome    s/p subtotal colectomy '86>diarrhea prone   NEOPLASM, MALIGNANT, BASAL CELL, CARCINOMA, HX OF    Seasonal allergies    Past Surgical History:  Procedure Laterality Date   ABDOMINOPLASTY      ANTERIOR CERVICAL DECOMP/DISCECTOMY FUSION N/A 02/14/2018   Procedure: CERVICAL THREE-FOUR,CERVICAL FOUR-FIVE ACDF, REMOVAL CERVICAL FIVE-SEVEN  PLATE.;  Surgeon: Isadora Mar, MD;  Location: [facility];  Service: Neurosurgery;  Laterality: N/A;  anterior   BACK SURGERY  1999   lower back   BREAST ENHANCEMENT SURGERY  1983   BREAST IMPLANT REMOVAL  08/2010   BREAST SURGERY     Augmentation in 1983, Reduction 2011   BUNIONECTOMY  right  and left foot   CERVICAL SPINE SURGERY  12/18/1999   C5/6 and C6/7   CHOLECYSTECTOMY N/A    Phreesia 09/11/2020   COLECTOMY  1986   COLON SURGERY N/A    [employer]reesia 09/11/2020   COSMETIC SURGERY     EXCISION/RELEASE BURSA HIP Left 07/04/2015   Procedure: LEFT HIP BURSECTOMY WITH ;  Surgeon: Liliane Rei, MD;  Location: [facility] ORS;  Service: Orthopedics;  Laterality: Left;   EYE SURGERY     Lasik 2010   eyelid surgery     FRACTURE SURGERY     HYSTEROSCOPY WITH D & C  08/17/2011   Procedure: DILATATION AND CURETTAGE (D&C) /HYSTEROSCOPY;  Surgeon: Piedad Brewer;  Location: [facility] OR[facility];  Service: Gynecology;  Laterality: N/A;   INJECTION KNEE Right 07/04/2015   Procedure: CORTISONE INJECTION RIGHT KNEE ;  Surgeon: Liliane Rei, MD;  Location: [facility];  Service: Orthopedics;  Laterality: Right;   JOINT REPLACEMENT N/A    Phreesia 09/11/2020   KNEE ARTHROSCOPY     right x 2   KNEE RECONSTRUCTION, MEDIAL PATELLAR FEMORAL LIGAMENT     right   LASIK  2009    bilateral   left foot surgery     removed foreign object   OPEN SURGICAL REPAIR OF GLUTEAL TENDON Left 07/04/2015   Procedure: GLUTEAL TENDON REPAIR ;  Surgeon: Liliane Rei, MD;  Location: [facility];  Service: Orthopedics;  Laterality: Left;   REDUCTION MAMMAPLASTY Bilateral    SPINE SURGERY N/A    Phreesia 09/11/2020   thumb surgery     TOTAL KNEE ARTHROPLASTY Right 06/17/2017   Procedure: RIGHT TOTAL KNEE ARTHROPLASTY;  Surgeon: Liliane Rei, MD;  Location: [facility];  Service: Orthopedics;   Laterality: Right;   TRIGGER FINGER RELEASE Right 09/11/2021   TUBAL LIGATION     Allergies  Allergen Reactions   Lactose Intolerance (Gi) Diarrhea   Nsaids Other (See Comments)    Hurts stomach   Other Rash    Retinol cream   Retinoids Hives, Itching and Rash   Prior to Admission medications   Medication Sig Start Date End Date Taking? Authorizing Provider  acetaminophen  (TYLENOL ) 500 MG tablet Take 1,000 mg by mouth every 8 (eight) hours as needed for moderate pain or headache.    Yes [provider]  atorvastatin  (LIPITOR) 20 MG tablet Take 1 tablet (20 mg total) by mouth every morning. 08/01/23  Yes Benjiman Bras, MD  buPROPion  (WELLBUTRIN  XL) 300 MG 24 hr tablet Take 1 tablet (300 mg total) by mouth daily. 08/01/23  Yes Benjiman Bras, MD  celecoxib (CELEBREX) 200 MG capsule Take 200 mg by mouth daily. 11/15/23  Yes [provider]  cholecalciferol (VITAMIN D3) 25 MCG (1000 UNIT) tablet Take by mouth. 03/04/19  Yes [provider]  Cyanocobalamin (VITAMIN B12) 1000 MCG TBCR Take 1,000 mcg by mouth daily.   Yes [provider]  diclofenac sodium (VOLTAREN) 1 % GEL Apply 1 g topically daily as needed.    Yes [provider]  diphenhydramine -acetaminophen  (TYLENOL  PM EXTRA STRENGTH) 25-500 MG TABS tablet Take 1 tablet by mouth at bedtime as needed.   Yes [provider]  fluorouracil (EFUDEX) 5 % cream fluorouracil 5 % topical cream 03/04/19  Yes [provider]  fluticasone (FLONASE) 50 MCG/ACT nasal spray  07/10/21  Yes [provider]  furosemide  (LASIX ) 40 MG tablet TAKE 1 TABLET DAILY OR AS ADVISED BY CARDIOLOGY 11/11/23  Yes Tabori, Katherine E, MD  HYDROcodone -acetaminophen  (NORCO/VICODIN) 5-325 MG tablet Take 1 tablet by mouth every 6 (six) hours as needed. 11/08/20  Yes [provider]  Liniments (SALONPAS PAIN RELIEF PATCH EX) Apply 2 patches topically daily.   Yes [provider]   loperamide  (IMODIUM  A-D) 2 MG tablet Take 1 tablet (2 mg total) by mouth 3 (three) times daily as needed for diarrhea or loose stools. Patient taking differently: Take 2 mg by mouth 2 (two) times daily. 04/13/19  Yes Stallings, Zoe A, MD  loratadine  (CLARITIN ) 10 MG tablet Take 10 mg by mouth daily.   Yes [provider]  MAGNESIUM GLYCINATE PO Take 450 mg by mouth.   Yes [provider]  niacinamide  500 MG tablet Take 500 mg by mouth 2 (two) times daily with a meal.   Yes [provider]  potassium chloride  (KLOR-CON ) 10 MEQ tablet Take 1 tablet (10 mEq total) by mouth daily as needed (with doing of furosemide .). 08/01/23  Yes Benjiman Bras, MD  promethazine  (PHENERGAN ) 25 MG tablet Take 0.5-1 tablets (12.5-25 mg total) by mouth every 6 (six) hours as needed for nausea or vomiting (headache). 08/01/23  Yes Benjiman Bras, MD  Simethicone (PHAZYME PO) Take 1 tablet by mouth every morning.    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  Retired.  Education some college.  2 children.  Caffeine 3-4 glasses of tea/ day.   Social Drivers of Corporate investment banker Strain: Low Risk  (01/06/2024)   Overall Financial Resource  Strain (CARDIA)    Difficulty of Paying Living Expenses: Not hard at all  Food Insecurity: No Food Insecurity (01/06/2024)   Hunger Vital Sign    Worried About Running Out of Food in the Last Year: Never true    Ran Out of Food in the Last Year: Never true  Transportation Needs: No Transportation Needs (01/06/2024)   PRAPARE - Administrator, Civil Service (Medical): No    Lack of Transportation (Non-Medical): No  Physical Activity: Unknown (01/06/2024)   Exercise Vital Sign    Days of Exercise per Week: Patient declined    Minutes of Exercise per Session: 150+ min  Stress: No Stress Concern Present (01/06/2024)   [employer] of Occupational Health - Occupational Stress Questionnaire    Feeling of Stress : Only a little  Social Connections: Socially Integrated (01/06/2024)   Social Connection and Isolation Panel [NHANES]    Frequency of Communication with Friends and Family: More than three times a week    Frequency of Social Gatherings with Friends and Family: Twice a week    Attends  Religious Services: 1 to 4 times per year    Active Member of Clubs or Organizations: Yes    Attends Banker Meetings: More than 4 times per year    Marital Status: Married  Catering manager Violence: Not At Risk (07/18/2022)   Humiliation, Afraid, Rape, and Kick questionnaire    Fear of Current or Ex-Partner: No    Emotionally Abused: No    Physically Abused: No    Sexually Abused: No    Review of Systems  Constitutional:  Negative for fatigue and unexpected weight change.  Respiratory:  Negative for chest tightness and shortness of breath.   Cardiovascular:  Negative for chest pain, palpitations and leg swelling.  Gastrointestinal:  Negative for abdominal pain and blood in stool.  Neurological:  Negative for dizziness, syncope, light-headedness and headaches.     Objective:   Vitals:   02/19/24 0947  BP: 136/66  Pulse: 70  Resp: 15  Temp: 98.7 F (37.1 C)   TempSrc: Temporal  SpO2: 98%  Weight: 159 lb 6.4 oz (72.3 kg)  Height: 5' 6.6 (1.692 m)     Physical Exam Vitals reviewed.  Constitutional:      Appearance: Normal appearance. She is well-developed.  HENT:     Head: Normocephalic and atraumatic.  Eyes:     Conjunctiva/sclera: Conjunctivae normal.     Pupils: Pupils are equal, round, and reactive to light.  Neck:     Vascular: No carotid bruit.  Cardiovascular:     Rate and Rhythm: Normal rate and regular rhythm.     Heart sounds: Normal heart sounds.  Pulmonary:     Effort: Pulmonary effort is normal.     Breath sounds: Normal breath sounds.  Abdominal:     Palpations: Abdomen is soft. There is no pulsatile mass.     Tenderness: There is no abdominal tenderness.  Musculoskeletal:     Right lower leg: No edema.     Left lower leg: No edema.  Skin:    General: Skin is warm and dry.  Neurological:     Mental Status: She is alert and oriented to person, place, and time.  Psychiatric:        Mood and Affect: Mood normal.        Behavior: Behavior normal.        Assessment & Plan:  Clark Clowdus is a 73 y.o. female . Insomnia, unspecified type  - Stable with over-the-counter sleepy time tea and diphenhydramine .  No changes.  Elects to not treat the mild OSA.  History of migraine headaches  - Rare symptoms, has Phenergan  if needed.  No changes for now.  We did discuss that there are other treatment options for migraines but if she is doing well and infrequent flares at this time, no changes.  Mixed hyperlipidemia - Plan: Comprehensive metabolic panel with GFR, Lipid panel, atorvastatin  (LIPITOR) 20 MG tablet  - Tolerating current med regimen, check labs and adjust plan accordingly.  Depression, recurrent (HCC) - Plan: buPROPion  (WELLBUTRIN  XL) 300 MG 24 hr tablet  - Stable with Wellbutrin , continue same  Essential hypertension - Plan: Comprehensive metabolic panel with GFR, Lipid panel Diastolic  dysfunction Pedal edema - Plan: potassium chloride  (KLOR-CON ) 10 MEQ tablet  - Stable control with regimen as above, continue same dose furosemide , potassium 4 days/week.  RTC precautions.  We did discuss potential cardiac risks with continued use of Celebrex, she is aware and understanding expressed.  Short-term, infrequent dosing recommended and keep follow-up  with Ortho for treatment of arthralgias.  Meds ordered this encounter  Medications   atorvastatin  (LIPITOR) 20 MG tablet    Sig: Take 1 tablet (20 mg total) by mouth every morning.    Dispense:  90 tablet    Refill:  3   buPROPion  (WELLBUTRIN  XL) 300 MG 24 hr tablet    Sig: Take 1 tablet (300 mg total) by mouth daily.    Dispense:  90 tablet    Refill:  3   potassium chloride  (KLOR-CON ) 10 MEQ tablet    Sig: Take 1 tablet (10 mEq total) by mouth daily as needed (with doing of furosemide .).    Dispense:  90 tablet    Refill:  2   Patient Instructions  Thank you for coming in today.  No medication changes at this time.  If any concerns on labs I will let you know.  Have a great trip sailing!    Signed,   Caro Christmas, MD [facility], [facility] 02/19/24 10:26 AM

## 2024-02-20 ENCOUNTER — Ambulatory Visit: Payer: Self-pay | Admitting: Family Medicine

## 2024-02-20 LAB — COMPREHENSIVE METABOLIC PANEL WITH GFR
AG Ratio: 2.6 (calc) — ABNORMAL HIGH (ref 1.0–2.5)
ALT: 12 U/L (ref 6–29)
AST: 16 U/L (ref 10–35)
Albumin: 4.6 g/dL (ref 3.6–5.1)
Alkaline phosphatase (APISO): 62 U/L (ref 37–153)
BUN: 14 mg/dL (ref 7–25)
CO2: 28 mmol/L (ref 20–32)
Calcium: 9.7 mg/dL (ref 8.6–10.4)
Chloride: 104 mmol/L (ref 98–110)
Creat: 0.76 mg/dL (ref 0.60–1.00)
Globulin: 1.8 g/dL — ABNORMAL LOW (ref 1.9–3.7)
Glucose, Bld: 97 mg/dL (ref 65–99)
Potassium: 4.4 mmol/L (ref 3.5–5.3)
Sodium: 141 mmol/L (ref 135–146)
Total Bilirubin: 0.8 mg/dL (ref 0.2–1.2)
Total Protein: 6.4 g/dL (ref 6.1–8.1)
eGFR: 83 mL/min/{1.73_m2} (ref >=60)

## 2024-02-20 LAB — LIPID PANEL
Cholesterol: 149 mg/dL (ref <200)
HDL: 64 mg/dL (ref >=50)
LDL Cholesterol (Calc): 65 mg/dL
Non-HDL Cholesterol (Calc): 85 mg/dL (ref <130)
Total CHOL/HDL Ratio: 2.3 (calc) (ref <5.0)
Triglycerides: 114 mg/dL (ref <150)

## 2024-02-24 DIAGNOSIS — Z961 Presence of intraocular lens: Secondary | ICD-10-CM | POA: Diagnosis not present

## 2024-02-24 DIAGNOSIS — H5000 Unspecified esotropia: Secondary | ICD-10-CM | POA: Diagnosis not present

## 2024-03-30 DIAGNOSIS — M25552 Pain in left hip: Secondary | ICD-10-CM | POA: Diagnosis not present

## 2024-03-31 DIAGNOSIS — M5416 Radiculopathy, lumbar region: Secondary | ICD-10-CM | POA: Diagnosis not present

## 2024-04-06 DIAGNOSIS — M1612 Unilateral primary osteoarthritis, left hip: Secondary | ICD-10-CM | POA: Diagnosis not present

## 2024-04-07 ENCOUNTER — Telehealth: Payer: Self-pay | Admitting: Family Medicine

## 2024-04-07 NOTE — Telephone Encounter (Signed)
 Type of form received: Surgical Clearance  Additional comments:   Received by: Fax  Form should be Faxed/mailed to: (address/ fax #) [phone]  Is patient requesting call for pickup: N/A  Form placed:  Labeled & placed in provider bin  Attach charge sheet.  Provider will determine charge.  Individual made aware of 3-5 business day turn around? N/A

## 2024-04-07 NOTE — Telephone Encounter (Signed)
 Called and made an appt 04/17/2024, patient does need cardiac clearance, surgery set for end of September

## 2024-04-08 NOTE — Telephone Encounter (Signed)
 Obtained documents from front desk. Placed in providers folder for review

## 2024-04-08 NOTE — Telephone Encounter (Signed)
 Noted, will complete paperwork at preop appointment.  Thanks.

## 2024-04-17 ENCOUNTER — Encounter: Payer: Self-pay | Admitting: Family Medicine

## 2024-04-17 ENCOUNTER — Ambulatory Visit: Admitting: Family Medicine

## 2024-04-17 VITALS — BP 128/64 | HR 75 | Temp 98.5°F | Resp 16 | Ht 66.6 in | Wt 159.0 lb

## 2024-04-17 DIAGNOSIS — Z01818 Encounter for other preprocedural examination: Secondary | ICD-10-CM | POA: Diagnosis not present

## 2024-04-17 LAB — COMPREHENSIVE METABOLIC PANEL WITH GFR
ALT: 13 U/L (ref 0–35)
AST: 17 U/L (ref 0–37)
Albumin: 4.5 g/dL (ref 3.5–5.2)
Alkaline Phosphatase: 78 U/L (ref 39–117)
BUN: 22 mg/dL (ref 6–23)
CO2: 29 meq/L (ref 19–32)
Calcium: 9.6 mg/dL (ref 8.4–10.5)
Chloride: 101 meq/L (ref 96–112)
Creatinine, Ser: 0.81 mg/dL (ref 0.40–1.20)
GFR: 71.78 mL/min (ref >60.00)
Glucose, Bld: 91 mg/dL (ref 70–99)
Potassium: 3.8 meq/L (ref 3.5–5.1)
Sodium: 140 meq/L (ref 135–145)
Total Bilirubin: 0.6 mg/dL (ref 0.2–1.2)
Total Protein: 6.9 g/dL (ref 6.0–8.3)

## 2024-04-17 LAB — CBC
HCT: 44.9 % (ref 36.0–46.0)
Hemoglobin: 14.7 g/dL (ref 12.0–15.0)
MCHC: 32.8 g/dL (ref 30.0–36.0)
MCV: 93 fl (ref 78.0–100.0)
Platelets: 241 K/uL (ref 150.0–400.0)
RBC: 4.83 Mil/uL (ref 3.87–5.11)
RDW: 12.9 % (ref 11.5–15.5)
WBC: 6.1 K/uL (ref 4.0–10.5)

## 2024-04-17 NOTE — Progress Notes (Addendum)
 Subjective:  Patient ID: [name], female    DOB: September 17, 1950  Age: 73 y.o. MRN: [id number]  CC:  Chief Complaint  Patient presents with   Medical Clearance    Lt hip replacement, set for 06/03/2024 unless sooner appt becomes available      HPI [name] presents for   Preop evaluation Plan for left hip replacement, September 24. We did discuss preop eval in April for a left shoulder replacement.  At that time discussed that [name] was not on any blood thinners or aspirin, [name] was able to walk at least 2 flights of stairs without dyspnea/chest pain as well as a city block.  [name] had history of mild obstructive sleep apnea with an AHI of 5.8% with option of CPAP previously but decided against treatment, only rare daytime somnolence at that time and has slept okay with use of a sleep aid.  No history of ischemic heart disease CHF CVD TIA or CVA.  [name] is not on insulin, no CKD.  At that time her RCRI index score was 3.9% with 0 points. EKG was performed in April with sinus rhythm, rate 68, PR 146, QTc 438 and no apparent acute ST or T wave changes or significant changes when compared to her August 2022 EKG.Most recent labs in June, CMP with creatinine 0.76, borderline low globulin with elevated A/G ratio.  Otherwise normal.  CBC normal in April.  Shoulder replacement deferred until possibly January , hip pain and surgery for that joint is priority now. Has been using celebrex for pain - risks have been discussed. On gabapentin  at bedtime.   No change in activity except limited walking with hip pain.  No chest pain with activity, some deconditioning with less exercise, walking stairs some fatigue, but no CP.   Lab Results  Component Value Date   HGBA1C 5.7 05/31/2009     History Patient Active Problem List   Diagnosis Date Noted   Pain in joint of left shoulder 01/31/2023   Complete rotator cuff tear of left shoulder 12/15/2022   History of cataract surgery, left  10/23/2022   Acquired trigger finger of right ring finger 06/22/2022   Pain in finger of right hand 06/22/2022   Cataract 06/11/2022   Hemorrhoid prolapse 04/10/2022   Lumbar radiculopathy 03/16/2022   History of repair of retinal tear by laser photocoagulation 12/21/2021   Cough, persistent 07/10/2021   Globus pharyngeus 07/10/2021   chronic diastolic chf 05/02/2021   Acquired trigger finger of right middle finger 07/14/2020   Squamous cell carcinoma of skin of arm 10/08/2019   Squamous cell carcinoma in situ (SCCIS) of skin 09/18/2019   Double vision 07/12/2019   Age-related vocal fold atrophy 04/30/2019   Muscle tension dysphonia 04/30/2019   Dysphagia 03/04/2019   Hoarseness 01/16/2019   Acquired trigger finger of right index finger 11/04/2018   Trigger finger 09/10/2018   Cervical vertebral fusion 02/14/2018   Cervical post-laminectomy syndrome 11/20/2017   DDD (degenerative disc disease), cervical 11/20/2017   Neck pain 11/20/2017   OA (osteoarthritis) of knee 06/17/2017   History of total knee replacement 06/17/2017   Trochanteric bursitis of left hip 07/03/2015   Routine general medical examination at a health care facility 11/18/2014   CARCINOMA, SKIN, SQUAMOUS CELL 07/06/2010   ACTINIC KERATOSIS 07/06/2010   NEOPLASM, MALIGNANT, BASAL CELL, CARCINOMA, HX OF 07/06/2010   Hyperlipidemia 06/08/2009   DEPRESSION 06/08/2009   POSTMENOPAUSAL STATUS 06/08/2009   IRRITABLE BOWEL SYNDROME 02/17/2008   Backache  02/17/2008   Migraine headache 02/05/2007   Essential hypertension 02/05/2007   Past Medical History:  Diagnosis Date   Arthritis    knees, hands   Bursitis of left hip    Cancer (HCC)    hx skin cancer   Cataract    Phreesia 09/11/2020   Depression    Depression    [employer]reesia 09/11/2020   GERD (gastroesophageal reflux disease)    INFREQUENT   Headache    HX MIGRAINES    Heart murmur     MVP 30 yrs ago - no treatment needed   Hyperlipidemia    on statin    Hypertension    flucuations has never been on any med   Insomnia    Irritable bowel syndrome    s/p subtotal colectomy '86>diarrhea prone   NEOPLASM, MALIGNANT, BASAL CELL, CARCINOMA, HX OF    Seasonal allergies    Past Surgical History:  Procedure Laterality Date   ABDOMINOPLASTY     ANTERIOR CERVICAL DECOMP/DISCECTOMY FUSION N/A 02/14/2018   Procedure: CERVICAL THREE-FOUR,CERVICAL FOUR-FIVE ACDF, REMOVAL CERVICAL FIVE-SEVEN  PLATE.;  Surgeon: Joshua Alm RAMAN, MD;  Location: [facility] OR;  Service: Neurosurgery;  Laterality: N/A;  anterior   BACK SURGERY  1999   lower back   BREAST ENHANCEMENT SURGERY  1983   BREAST IMPLANT REMOVAL  08/2010   BREAST SURGERY     Augmentation in 1983, Reduction 2011   BUNIONECTOMY     right  and left foot   CERVICAL SPINE SURGERY  12/18/1999   C5/6 and C6/7   CHOLECYSTECTOMY N/A    Phreesia 09/11/2020   COLECTOMY  1986   COLON SURGERY N/A    Phreesia 09/11/2020   COSMETIC SURGERY     EXCISION/RELEASE BURSA HIP Left 07/04/2015   Procedure: LEFT HIP BURSECTOMY WITH ;  Surgeon: Dempsey Moan, MD;  Location: [facility] ORS;  Service: Orthopedics;  Laterality: Left;   EYE SURGERY     Lasik 2010   eyelid surgery     FRACTURE SURGERY     HYSTEROSCOPY WITH D & C  08/17/2011   Procedure: DILATATION AND CURETTAGE (D&C) /HYSTEROSCOPY;  Surgeon: Charlie CHRISTELLA Croak;  Location: [facility] OR[facility];  Service: Gynecology;  Laterality: N/A;   INJECTION KNEE Right 07/04/2015   Procedure: CORTISONE INJECTION RIGHT KNEE ;  Surgeon: Dempsey Moan, MD;  Location: [facility];  Service: Orthopedics;  Laterality: Right;   JOINT REPLACEMENT N/A    Phreesia 09/11/2020   KNEE ARTHROSCOPY     right x 2   KNEE RECONSTRUCTION, MEDIAL PATELLAR FEMORAL LIGAMENT     right   LASIK  2009    bilateral   left foot surgery     removed foreign object   OPEN SURGICAL REPAIR OF GLUTEAL TENDON Left 07/04/2015   Procedure: GLUTEAL TENDON REPAIR ;  Surgeon: Dempsey Moan, MD;  Location: [facility];  Service:  Orthopedics;  Laterality: Left;   REDUCTION MAMMAPLASTY Bilateral    SPINE SURGERY N/A    Phreesia 09/11/2020   thumb surgery     TOTAL KNEE ARTHROPLASTY Right 06/17/2017   Procedure: RIGHT TOTAL KNEE ARTHROPLASTY;  Surgeon: Moan Dempsey, MD;  Location: [facility];  Service: Orthopedics;  Laterality: Right;   TRIGGER FINGER RELEASE Right 09/11/2021   TUBAL LIGATION     Allergies  Allergen Reactions   Lactose Intolerance (Gi) Diarrhea   Nsaids Other (See Comments)    Hurts stomach   Other Rash and Other (See Comments)    Retinol cream   Retinoids  Hives, Itching and Rash   Prior to Admission medications   Medication Sig Start Date End Date Taking? Authorizing Provider  atorvastatin  (LIPITOR) 20 MG tablet Take 1 tablet (20 mg total) by mouth every morning. 02/19/24  Yes Levora Reyes SAUNDERS, MD  buPROPion  (WELLBUTRIN  XL) 300 MG 24 hr tablet Take 1 tablet (300 mg total) by mouth daily. 02/19/24  Yes Levora Reyes SAUNDERS, MD  celecoxib (CELEBREX) 200 MG capsule Take 200 mg by mouth daily. 11/15/23  Yes [provider]  cholecalciferol (VITAMIN D3) 25 MCG (1000 UNIT) tablet Take by mouth. 03/04/19  Yes [provider]  Cyanocobalamin (VITAMIN B12) 1000 MCG TBCR Take 1,000 mcg by mouth daily.   Yes [provider]  diclofenac sodium (VOLTAREN) 1 % GEL Apply 1 g topically daily as needed.    Yes [provider]  fluorouracil (EFUDEX) 5 % cream fluorouracil 5 % topical cream 03/04/19  Yes [provider]  fluticasone (FLONASE) 50 MCG/ACT nasal spray  07/10/21  Yes [provider]  furosemide  (LASIX ) 40 MG tablet TAKE 1 TABLET DAILY OR AS ADVISED BY CARDIOLOGY 11/11/23  Yes Tabori, Katherine E, MD  gabapentin  (NEURONTIN ) 100 MG capsule take 1 capsule at night for 3 nights. Then may increase to 1 capsule BID if needed 04/13/24  Yes [provider]  HYDROcodone -acetaminophen  (NORCO/VICODIN) 5-325 MG tablet Take 1 tablet by mouth every 6 (six) hours as  needed. 11/08/20  Yes [provider]  Liniments (SALONPAS PAIN RELIEF PATCH EX) Apply 2 patches topically daily.   Yes [provider]  loperamide  (IMODIUM  A-D) 2 MG tablet Take 1 tablet (2 mg total) by mouth 3 (three) times daily as needed for diarrhea or loose stools. Patient taking differently: Take 2 mg by mouth 2 (two) times daily. 04/13/19  Yes Stallings, Zoe A, MD  loratadine  (CLARITIN ) 10 MG tablet Take 10 mg by mouth daily.   Yes [provider]  MAGNESIUM GLYCINATE PO Take 450 mg by mouth.   Yes [provider]  niacinamide  500 MG tablet Take 500 mg by mouth 2 (two) times daily with a meal.   Yes [provider]  potassium chloride  (KLOR-CON ) 10 MEQ tablet Take 1 tablet (10 mEq total) by mouth daily as needed (with doing of furosemide .). 02/19/24  Yes Levora Reyes SAUNDERS, MD  promethazine  (PHENERGAN ) 25 MG tablet Take 0.5-1 tablets (12.5-25 mg total) by mouth every 6 (six) hours as needed for nausea or vomiting (headache). 08/01/23  Yes Levora Reyes SAUNDERS, MD  Simethicone (PHAZYME PO) Take 1 tablet by mouth every morning.    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  Retired.  Education some college.  2 children.  Caffeine 3-4 glasses of tea/ day.   Social Drivers of Corporate investment banker Strain: Low Risk  (04/13/2024)   Overall Financial Resource Strain (CARDIA)    Difficulty of Paying Living Expenses: Not hard at all  Food Insecurity: No Food Insecurity (04/13/2024)   Hunger Vital Sign    Worried About Running Out of Food in the Last Year: Never true    Ran Out of Food in the Last Year: Never true  Transportation Needs: No Transportation Needs (04/13/2024)   PRAPARE - Administrator, Civil Service (Medical): No    Lack of Transportation (Non-Medical): No  Physical Activity: Inactive (04/13/2024)   Exercise Vital Sign    Days of Exercise per Week: 0 days    Minutes of Exercise per Session: Not on file  Stress: No Stress Concern Present (04/13/2024)   [employer] of Occupational Health - Occupational Stress Questionnaire    Feeling of Stress: Not at all  Social Connections: Moderately Integrated (04/13/2024)   Social Connection and Isolation Panel    Frequency of Communication with Friends and Family: Twice a week    Frequency of Social Gatherings with Friends and Family: Twice a week    Attends Religious Services: Never    Database administrator or Organizations: Yes    Attends Engineer, structural: More than 4 times per year    Marital Status: Married  Catering manager Violence: Not At Risk (07/18/2022)   Humiliation, Afraid, Rape, and Kick questionnaire    Fear of Current or Ex-Partner: No    Emotionally Abused: No    Physically Abused: No    Sexually Abused: No    Review of Systems  Per hpi.  Objective:   Vitals:   04/17/24 0835  BP: 128/64  Pulse: 75  Resp: 16  Temp: 98.5 F (36.9 C)  TempSrc: Temporal  SpO2: 98%  Weight: 159 lb (72.1 kg)  Height: 5' 6.6 (1.692 m)     Physical Exam Vitals reviewed.  Constitutional:      Appearance:  Normal appearance. [name] is well-developed.  HENT:     Head: Normocephalic and atraumatic.  Eyes:     Conjunctiva/sclera: Conjunctivae normal.     Pupils: Pupils are equal, round, and reactive to light.  Neck:     Vascular: No carotid bruit.  Cardiovascular:     Rate and Rhythm: Normal rate and regular rhythm.     Heart sounds: Normal heart sounds.  Pulmonary:     Effort: Pulmonary effort is normal.     Breath sounds: Normal breath sounds.  Abdominal:     Palpations: Abdomen is soft. There is no pulsatile mass.     Tenderness: There is no abdominal tenderness.  Musculoskeletal:     Right lower leg: No edema.     Left lower leg: No edema.  Skin:    General: Skin is warm and dry.  Neurological:     Mental Status: [name] is alert and oriented to person, place, and time.  Psychiatric:        Mood and Affect: Mood normal.        Behavior: Behavior normal.      EKG, sinus rhythm, rate 72.  No acute ST/T wave changes.  No significant changes from comparison EKG on 01/06/2024.  Assessment &  Plan:  [name] is a 73 y.o. female . Preoperative evaluation to rule out surgical contraindication - Plan: EKG 12-Lead, CBC, Comprehensive metabolic panel with GFR RCRI scoring in April, no apparent increased risk of MACE.  No significant changes in health since that time.  Some fatigue with walking upstairs but thought to be related to deconditioning as her exercise has been limited due to hip pain.  No changes on EKG, no chest pain with activity.  Check labs as above, then will complete paperwork for surgeon as appears to be at acceptable risk for planned procedure.  No orders of the defined types were placed in this encounter.  Patient Instructions  Thanks for coming today.  I will check some basic blood work for your upcoming surgery but I do not anticipate any concerns.  Based on our discussion and EKG you appear to be at acceptable risk for that surgery as we have discussed  previously for your shoulder surgery.  I will complete paperwork for your surgeon once I reviewed labs.  Please keep me posted if anything changes.  Good luck!    Signed,   Reyes Pines, MD [facility], [facility] 04/17/24 9:08 AM

## 2024-04-17 NOTE — Patient Instructions (Signed)
 Thanks for coming today.  I will check some basic blood work for your upcoming surgery but I do not anticipate any concerns.  Based on our discussion and EKG you appear to be at acceptable risk for that surgery as we have discussed previously for your shoulder surgery.  I will complete paperwork for your surgeon once I reviewed labs.  Please keep me posted if anything changes.  Good luck!

## 2024-04-21 ENCOUNTER — Ambulatory Visit: Payer: Self-pay | Admitting: Family Medicine

## 2024-04-21 ENCOUNTER — Telehealth: Payer: Self-pay | Admitting: Family Medicine

## 2024-04-21 NOTE — Telephone Encounter (Signed)
 Faxed back with labs and EKG sent

## 2024-04-21 NOTE — Telephone Encounter (Signed)
 Preoperative clearance form completed.Paperwork completed and placed in fax bin at back nurse station

## 2024-04-22 DIAGNOSIS — M1612 Unilateral primary osteoarthritis, left hip: Secondary | ICD-10-CM | POA: Diagnosis not present

## 2024-04-27 DIAGNOSIS — D485 Neoplasm of uncertain behavior of skin: Secondary | ICD-10-CM | POA: Diagnosis not present

## 2024-04-27 DIAGNOSIS — D225 Melanocytic nevi of trunk: Secondary | ICD-10-CM | POA: Diagnosis not present

## 2024-04-27 DIAGNOSIS — L570 Actinic keratosis: Secondary | ICD-10-CM | POA: Diagnosis not present

## 2024-04-27 DIAGNOSIS — L821 Other seborrheic keratosis: Secondary | ICD-10-CM | POA: Diagnosis not present

## 2024-04-27 DIAGNOSIS — L814 Other melanin hyperpigmentation: Secondary | ICD-10-CM | POA: Diagnosis not present

## 2024-04-27 DIAGNOSIS — Z85828 Personal history of other malignant neoplasm of skin: Secondary | ICD-10-CM | POA: Diagnosis not present

## 2024-04-27 DIAGNOSIS — Z86018 Personal history of other benign neoplasm: Secondary | ICD-10-CM | POA: Diagnosis not present

## 2024-04-27 DIAGNOSIS — D0462 Carcinoma in situ of skin of left upper limb, including shoulder: Secondary | ICD-10-CM | POA: Diagnosis not present

## 2024-04-27 DIAGNOSIS — L578 Other skin changes due to chronic exposure to nonionizing radiation: Secondary | ICD-10-CM | POA: Diagnosis not present

## 2024-05-25 ENCOUNTER — Ambulatory Visit: Admitting: Family Medicine

## 2024-06-03 DIAGNOSIS — M1612 Unilateral primary osteoarthritis, left hip: Secondary | ICD-10-CM | POA: Diagnosis not present

## 2024-06-03 DIAGNOSIS — M25752 Osteophyte, left hip: Secondary | ICD-10-CM | POA: Diagnosis not present

## 2024-07-07 DIAGNOSIS — Z5189 Encounter for other specified aftercare: Secondary | ICD-10-CM | POA: Diagnosis not present

## 2024-07-14 DIAGNOSIS — Z23 Encounter for immunization: Secondary | ICD-10-CM | POA: Diagnosis not present

## 2024-07-15 DIAGNOSIS — Z23 Encounter for immunization: Secondary | ICD-10-CM | POA: Diagnosis not present

## 2024-08-11 DIAGNOSIS — M25552 Pain in left hip: Secondary | ICD-10-CM | POA: Diagnosis not present

## 2024-08-11 DIAGNOSIS — Z5189 Encounter for other specified aftercare: Secondary | ICD-10-CM | POA: Diagnosis not present

## 2024-08-14 ENCOUNTER — Telehealth: Payer: Self-pay | Admitting: Family Medicine

## 2024-08-14 NOTE — Telephone Encounter (Signed)
 Type of form received: Surgical Clearance - Left Shoulder  Additional comments: Dr Levora stated to add to 6 month F/U appt to discuss with patient  Received by: Fax  Form should be Faxed/mailed to: (address/ fax #) [phone]  Is patient requesting call for pickup: N/A  Form placed:  Given to Meighan to put in provider folder at nurse's station  Attach charge sheet.  Provider will determine charge.  Individual made aware of 3-5 business day turn around? N/A

## 2024-08-14 NOTE — Telephone Encounter (Signed)
 Surgical forms are in folder at nurse station. Added surgical clearance note to OV on 08/20/24

## 2024-08-18 NOTE — Telephone Encounter (Signed)
 I have paperwork and can review at upcoming appt. Thanks.

## 2024-08-20 ENCOUNTER — Ambulatory Visit: Admitting: Family Medicine

## 2024-08-20 ENCOUNTER — Encounter: Payer: Self-pay | Admitting: Family Medicine

## 2024-08-20 VITALS — BP 142/88 | HR 79 | Temp 97.9°F | Resp 12 | Ht 66.6 in | Wt 160.6 lb

## 2024-08-20 DIAGNOSIS — G4700 Insomnia, unspecified: Secondary | ICD-10-CM

## 2024-08-20 DIAGNOSIS — Z01818 Encounter for other preprocedural examination: Secondary | ICD-10-CM

## 2024-08-20 DIAGNOSIS — F339 Major depressive disorder, recurrent, unspecified: Secondary | ICD-10-CM | POA: Diagnosis not present

## 2024-08-20 DIAGNOSIS — I10 Essential (primary) hypertension: Secondary | ICD-10-CM | POA: Diagnosis not present

## 2024-08-20 DIAGNOSIS — Z8669 Personal history of other diseases of the nervous system and sense organs: Secondary | ICD-10-CM | POA: Diagnosis not present

## 2024-08-20 DIAGNOSIS — Z8639 Personal history of other endocrine, nutritional and metabolic disease: Secondary | ICD-10-CM | POA: Diagnosis not present

## 2024-08-20 DIAGNOSIS — E782 Mixed hyperlipidemia: Secondary | ICD-10-CM | POA: Diagnosis not present

## 2024-08-20 LAB — COMPREHENSIVE METABOLIC PANEL WITH GFR
ALT: 11 U/L (ref 0–35)
AST: 15 U/L (ref 0–37)
Albumin: 4.9 g/dL (ref 3.5–5.2)
Alkaline Phosphatase: 83 U/L (ref 39–117)
BUN: 17 mg/dL (ref 6–23)
CO2: 31 meq/L (ref 19–32)
Calcium: 9.9 mg/dL (ref 8.4–10.5)
Chloride: 104 meq/L (ref 96–112)
Creatinine, Ser: 0.84 mg/dL (ref 0.40–1.20)
GFR: 68.55 mL/min (ref >60.00)
Glucose, Bld: 95 mg/dL (ref 70–99)
Potassium: 4.5 meq/L (ref 3.5–5.1)
Sodium: 140 meq/L (ref 135–145)
Total Bilirubin: 0.6 mg/dL (ref 0.2–1.2)
Total Protein: 7.1 g/dL (ref 6.0–8.3)

## 2024-08-20 LAB — CBC WITH DIFFERENTIAL/PLATELET
Basophils Absolute: 0 K/uL (ref 0.0–0.1)
Basophils Relative: 0.7 % (ref 0.0–3.0)
Eosinophils Absolute: 0.2 K/uL (ref 0.0–0.7)
Eosinophils Relative: 3 % (ref 0.0–5.0)
HCT: 43.3 % (ref 36.0–46.0)
Hemoglobin: 14.5 g/dL (ref 12.0–15.0)
Lymphocytes Relative: 29.3 % (ref 12.0–46.0)
Lymphs Abs: 2.1 K/uL (ref 0.7–4.0)
MCHC: 33.5 g/dL (ref 30.0–36.0)
MCV: 91.9 fl (ref 78.0–100.0)
Monocytes Absolute: 0.6 K/uL (ref 0.1–1.0)
Monocytes Relative: 9 % (ref 3.0–12.0)
Neutro Abs: 4.1 K/uL (ref 1.4–7.7)
Neutrophils Relative %: 58 % (ref 43.0–77.0)
Platelets: 276 K/uL (ref 150.0–400.0)
RBC: 4.72 Mil/uL (ref 3.87–5.11)
RDW: 13.8 % (ref 11.5–15.5)
WBC: 7.1 K/uL (ref 4.0–10.5)

## 2024-08-20 LAB — LIPID PANEL
Cholesterol: 170 mg/dL (ref 0–200)
HDL: 71.2 mg/dL (ref >39.00)
LDL Cholesterol: 82 mg/dL (ref 0–99)
NonHDL: 98.32
Total CHOL/HDL Ratio: 2
Triglycerides: 82 mg/dL (ref 0.0–149.0)
VLDL: 16.4 mg/dL (ref 0.0–40.0)

## 2024-08-20 LAB — HEMOGLOBIN A1C: Hgb A1c MFr Bld: 5.3 % (ref 4.6–6.5)

## 2024-08-20 MED ORDER — ZOLPIDEM TARTRATE 5 MG PO TABS: 2.5000 mg | ORAL_TABLET | Freq: Every evening | ORAL | 2 refills | Status: AC | PRN

## 2024-08-20 NOTE — Progress Notes (Unsigned)
 Subjective:  Patient ID: [name], female    DOB: [birth year]  Age: 73 y.o. MRN: [id number]  CC:  Chief Complaint  Patient presents with   Follow-up    Patient is here for a 6 month follow up and surgical clearance on left shoulder. Surgery date is 10/21/23. Patient is also having a hard time sleeping    HPI [name] presents for   Preoperative evaluation for left shoulder surgery Paperwork received, plan for left total shoulder arthroplasty with Dr. Melita.  General anesthesia.  Planned 10/20/24. She did undergo preop eval in April for left hip arthroplasty.  At that time she denied any chest pain with activity, was able to walk stairs with some fatigue but no chest pain.  Some deconditioning with less exercise due to hip at that time.  Labs were obtained including CBC and BMP, both reassuring.  Thought to be at acceptable risk of procedure at that time. EKG on 04/17/2024 with normal sinus rhythm, no acute findings. Denies CP/dyspnea with walking stairs. Hip pain, but improving. No CP/DOE. No paroxysmal nocturnal dyspnea. No blood thinners. On celebrex for shoulder pain daily.   Insomnia Discussed in June.  History of depression, insomnia, fatigue, Wellbutrin  300 mg daily.  She had tried some home treatments with some benefit but was waking up early.  Trazodone  caused daytime somnolence.  Had side effects with Ambien in the past.  We discussed sleep hygiene.  Mild OSA with option of AutoPap or consideration of oral appliance when met with sleep specialist.  She did not tolerate hydroxyzine  but had tolerated 50 mg of diphenhydramine .  Combination of sleepytime tea and Benadryl  was effective when discussed in June.  She had elected not to treat the mild OSA.  Benadryl  not effective now. Drying nose. Would like to try ambien again - tried half of one and felt fine. Did not feel sleepy. Wakes up frequently during night - not in pain. No PND.  Controlled substance database  (PDMP) reviewed. No concerns appreciated. Only half of hydrocodone  occasionally for pain, not taking daily.   Mood doing ok - just trouble sleeping.      08/20/2024    9:52 AM 04/17/2024    8:39 AM 02/19/2024    9:49 AM 01/06/2024    1:07 PM 08/01/2023   10:34 AM  Depression screen PHQ 2/9  Decreased Interest 0 2 0 0 0  Down, Depressed, Hopeless 0 1 0 0 0  PHQ - 2 Score 0 3 0 0 0  Altered sleeping 3 0 1 0 3  Tired, decreased energy 1 3 0 0 1  Change in appetite 0 0 0 0 0  Feeling bad or failure about yourself  0 0 0 0 0  Trouble concentrating 3 0 1 0 3  Moving slowly or fidgety/restless 0 0 0 0 0  Suicidal thoughts 0 0 0 0 0  PHQ-9 Score 7 6  2   0  7   Difficult doing work/chores Not difficult at all Somewhat difficult Not difficult at all       Data saved with a previous flowsheet row definition     Hyperlipidemia: Treated with Lipitor 20 mg daily. No new side effects. Fasting today.  Lab Results  Component Value Date   CHOL 149 02/19/2024   HDL 64 02/19/2024   LDLCALC 65 02/19/2024   TRIG 114 02/19/2024   CHOLHDL 2.3 02/19/2024   Lab Results  Component Value Date   ALT 13 04/17/2024  AST 17 04/17/2024   ALKPHOS 78 04/17/2024   BILITOT 0.6 04/17/2024   Lab Results  Component Value Date   HGBA1C 5.7 05/31/2009  Borderline A1c in past. Glucose normal recently, 100 in 08/2023.   History of migraine headaches Rare flare, treated with Phenergan , option of other medications if increased flares have been discussed, no recent migraine.   Hypertension: With history of chronic diastolic CHF, grade 1 DD on echo in 2022.  However because of heart disease seen by cardiology previously treated with Lasix  4 days/week with potassium 4 days/week when discussed in June. No CP/DOE.  Gum pain from procedure last week.  Home readings:none BP Readings from Last 3 Encounters:  08/20/24 (!) 142/88  04/17/24 128/64  02/19/24 136/66   Lab Results  Component Value Date    CREATININE 0.81 04/17/2024     History Patient Active Problem List   Diagnosis Date Noted   Osteoarthritis of left hip 04/22/2024   Pain in joint of left shoulder 01/31/2023   Complete rotator cuff tear of left shoulder 12/15/2022   History of cataract surgery, left 10/23/2022   Acquired trigger finger of right ring finger 06/22/2022   Pain in finger of right hand 06/22/2022   Cataract 06/11/2022   Hemorrhoid prolapse 04/10/2022   Lumbar radiculopathy 03/16/2022   History of repair of retinal tear by laser photocoagulation 12/21/2021   Cough, persistent 07/10/2021   Globus pharyngeus 07/10/2021   chronic diastolic chf 05/02/2021   Acquired trigger finger of right middle finger 07/14/2020   Squamous cell carcinoma of skin of arm 10/08/2019   Squamous cell carcinoma in situ (SCCIS) of skin 09/18/2019   Double vision 07/12/2019   Age-related vocal fold atrophy 04/30/2019   Muscle tension dysphonia 04/30/2019   Dysphagia 03/04/2019   Hoarseness 01/16/2019   Acquired trigger finger of right index finger 11/04/2018   Trigger finger 09/10/2018   Cervical vertebral fusion 02/14/2018   Cervical post-laminectomy syndrome 11/20/2017   DDD (degenerative disc disease), cervical 11/20/2017   Neck pain 11/20/2017   OA (osteoarthritis) of knee 06/17/2017   History of total knee replacement 06/17/2017   Trochanteric bursitis of left hip 07/03/2015   Routine general medical examination at a health care facility 11/18/2014   CARCINOMA, SKIN, SQUAMOUS CELL 07/06/2010   ACTINIC KERATOSIS 07/06/2010   NEOPLASM, MALIGNANT, BASAL CELL, CARCINOMA, HX OF 07/06/2010   Hyperlipidemia 06/08/2009   DEPRESSION 06/08/2009   POSTMENOPAUSAL STATUS 06/08/2009   IRRITABLE BOWEL SYNDROME 02/17/2008   Backache 02/17/2008   Migraine headache 02/05/2007   Essential hypertension 02/05/2007   Past Medical History:  Diagnosis Date   Allergy    Anxiety    Arthritis    knees, hands   Bursitis of left hip     Cancer (HCC)    hx skin cancer   Cataract    Phreesia 09/11/2020   Depression    Depression    Phreesia 09/11/2020   GERD (gastroesophageal reflux disease)    INFREQUENT   Headache    HX MIGRAINES    Heart murmur     MVP 30 yrs ago - no treatment needed   Hyperlipidemia    on statin   Hypertension    flucuations has never been on any med   Insomnia    Irritable bowel syndrome    s/p subtotal colectomy '86>diarrhea prone   NEOPLASM, MALIGNANT, BASAL CELL, CARCINOMA, HX OF    Seasonal allergies    Past Surgical History:  Procedure Laterality Date   ABDOMINOPLASTY  ANTERIOR CERVICAL DECOMP/DISCECTOMY FUSION N/A 02/14/2018   Procedure: CERVICAL THREE-FOUR,CERVICAL FOUR-FIVE ACDF, REMOVAL CERVICAL FIVE-SEVEN  PLATE.;  Surgeon: Joshua Alm RAMAN, MD;  Location: [facility];  Service: Neurosurgery;  Laterality: N/A;  anterior   BACK SURGERY  1999   lower back   BREAST ENHANCEMENT SURGERY  1983   BREAST IMPLANT REMOVAL  08/2010   BREAST SURGERY     Augmentation in 1983, Reduction 2011   BUNIONECTOMY     right  and left foot   CERVICAL SPINE SURGERY  12/18/1999   C5/6 and C6/7   CHOLECYSTECTOMY N/A    Phreesia 09/11/2020   COLECTOMY  1986   COLON SURGERY N/A    Phreesia 09/11/2020   COSMETIC SURGERY     EXCISION/RELEASE BURSA HIP Left 07/04/2015   Procedure: LEFT HIP BURSECTOMY WITH ;  Surgeon: Dempsey Moan, MD;  Location: [facility] ORS;  Service: Orthopedics;  Laterality: Left;   EYE SURGERY     Lasik 2010   eyelid surgery     FRACTURE SURGERY     HYSTEROSCOPY WITH D & C  08/17/2011   Procedure: DILATATION AND CURETTAGE (D&C) /HYSTEROSCOPY;  Surgeon: Charlie CHRISTELLA Croak;  Location: [facility];  Service: Gynecology;  Laterality: N/A;   INJECTION KNEE Right 07/04/2015   Procedure: CORTISONE INJECTION RIGHT KNEE ;  Surgeon: Dempsey Moan, MD;  Location: [facility];  Service: Orthopedics;  Laterality: Right;   JOINT REPLACEMENT N/A    Phreesia 09/11/2020   KNEE ARTHROSCOPY     right x 2    KNEE RECONSTRUCTION, MEDIAL PATELLAR FEMORAL LIGAMENT     right   LASIK  2009    bilateral   left foot surgery     removed foreign object   OPEN SURGICAL REPAIR OF GLUTEAL TENDON Left 07/04/2015   Procedure: GLUTEAL TENDON REPAIR ;  Surgeon: Dempsey Moan, MD;  Location: [facility];  Service: Orthopedics;  Laterality: Left;   REDUCTION MAMMAPLASTY Bilateral    SPINE SURGERY N/A    Phreesia 09/11/2020   thumb surgery     TOTAL KNEE ARTHROPLASTY Right 06/17/2017   Procedure: RIGHT TOTAL KNEE ARTHROPLASTY;  Surgeon: Moan Dempsey, MD;  Location: [facility];  Service: Orthopedics;  Laterality: Right;   TRIGGER FINGER RELEASE Right 09/11/2021   TUBAL LIGATION     Allergies[1] Prior to Admission medications  Medication Sig Start Date End Date Taking? Authorizing Provider  atorvastatin  (LIPITOR) 20 MG tablet Take 1 tablet (20 mg total) by mouth every morning. 02/19/24  Yes Levora Reyes SAUNDERS, MD  buPROPion  (WELLBUTRIN  XL) 300 MG 24 hr tablet Take 1 tablet (300 mg total) by mouth daily. 02/19/24  Yes Levora Reyes SAUNDERS, MD  celecoxib (CELEBREX) 200 MG capsule Take 200 mg by mouth daily. 11/15/23  Yes [provider]  cholecalciferol (VITAMIN D3) 25 MCG (1000 UNIT) tablet Take by mouth. 03/04/19  Yes [provider]  Cyanocobalamin (VITAMIN B12) 1000 MCG TBCR Take 1,000 mcg by mouth daily.   Yes [provider]  diclofenac sodium (VOLTAREN) 1 % GEL Apply 1 g topically daily as needed.    Yes [provider]  fluorouracil (EFUDEX) 5 % cream fluorouracil 5 % topical cream 03/04/19  Yes [provider]  furosemide  (LASIX ) 40 MG tablet TAKE 1 TABLET DAILY OR AS ADVISED BY CARDIOLOGY 11/11/23  Yes Tabori, Katherine E, MD  HYDROcodone -acetaminophen  (NORCO/VICODIN) 5-325 MG tablet Take 1 tablet by mouth every 6 (six) hours as needed. 11/08/20  Yes [provider]  Liniments PRECILLA PAIN RELIEF  PATCH EX) Apply 2 patches topically daily.   Yes [provider]  loperamide  (IMODIUM  A-D) 2 MG tablet Take 1 tablet (2 mg total) by mouth 3 (three) times daily as needed for diarrhea or loose stools. Patient taking differently: Take 2 mg by mouth 2 (two) times daily. 04/13/19  Yes Stallings, Zoe A, MD  loratadine  (CLARITIN ) 10 MG tablet Take 10 mg by mouth daily.   Yes [provider]  MAGNESIUM GLYCINATE PO Take 450 mg by mouth.   Yes [provider]  niacinamide  500 MG tablet Take 500 mg by mouth 2 (two) times daily with a meal.   Yes [provider]  potassium chloride  (KLOR-CON ) 10 MEQ tablet Take 1 tablet (10 mEq total) by mouth daily as needed (with doing of furosemide .). 02/19/24  Yes Levora Reyes SAUNDERS, MD  promethazine  (PHENERGAN ) 25 MG tablet Take 0.5-1 tablets (12.5-25 mg total) by mouth every 6 (six) hours as needed for nausea or vomiting (headache). 08/01/23  Yes Levora Reyes SAUNDERS, MD  Simethicone (PHAZYME PO) Take 1 tablet by mouth every morning.    Yes [provider]  fluticasone OREN) 50 MCG/ACT nasal spray  07/10/21   [provider]  gabapentin  (NEURONTIN ) 100 MG capsule take 1 capsule at night for 3 nights. 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  Retired.  Education some college.  2 children.  Caffeine 3-4 glasses of tea/ day.   Social Drivers of Health   Tobacco Use: Low Risk (08/20/2024)   Patient History    Smoking Tobacco Use: Never    Smokeless Tobacco Use: Never    Passive Exposure: Not on file  Financial Resource Strain: Low Risk (08/17/2024)   Overall Financial Resource Strain (CARDIA)    Difficulty of Paying Living Expenses: Not hard at all  Food Insecurity: No Food Insecurity (08/17/2024)   Epic    Worried About Programme Researcher, Broadcasting/film/video in the Last Year: Never true    Ran Out of Food in the Last Year: Never true  Transportation Needs: No Transportation Needs (08/17/2024)   Epic    Lack of Transportation (Medical): No    Lack of Transportation (Non-Medical): No  Physical Activity: Inactive (08/17/2024)   Exercise Vital Sign    Days of Exercise per Week: 0 days    Minutes of Exercise per Session: Not on file  Stress: No Stress Concern Present (08/17/2024)   [employer] of Occupational Health -  Occupational Stress Questionnaire    Feeling of Stress: Only a little  Social Connections: Socially Integrated (08/17/2024)   Social Connection and Isolation Panel    Frequency of Communication with Friends and Family: More than three times a week    Frequency of Social Gatherings with Friends and Family: Once a week    Attends Religious Services: 1 to 4 times per year    Active Member of [employer] or Organizations: Yes    Attends Engineer, Structural: More than 4 times per year    Marital Status: Married  Catering Manager Violence: Not At Risk (07/18/2022)   Humiliation, Afraid, Rape, and Kick questionnaire    Fear of Current or Ex-Partner: No    Emotionally Abused: No    Physically Abused: No    Sexually Abused: No  Depression (PHQ2-9): Medium Risk (08/20/2024)   Depression  (PHQ2-9)    PHQ-2 Score: 7  Alcohol Screen: Low Risk (08/17/2024)   Alcohol Screen    Last Alcohol Screening Score (AUDIT): 1  Housing: Low Risk (08/17/2024)   Epic    Unable to Pay for Housing in the Last Year: No    Number of Times Moved in the Last Year: 0    Homeless in the Last Year: No  Utilities: Low Risk (10/16/2023)   Received from [facility]   Utilities    In the past 12 months has the electric, gas, oil, or water company threatened to shut off services in your home? : No  Health Literacy: Not on file    Review of Systems  Constitutional:  Negative for fatigue and unexpected weight change.  Respiratory:  Negative for chest tightness and shortness of breath.   Cardiovascular:  Negative for chest pain, palpitations and leg swelling.  Gastrointestinal:  Negative for abdominal pain and blood in stool.  Neurological:  Negative for dizziness, syncope, light-headedness and headaches.     Objective:   Vitals:   08/20/24 0949 08/20/24 0957 08/20/24 1026  BP: (!) 144/88 (!) 140/82 (!) 142/88  Pulse: 79    Resp: 12    Temp: 97.9 F (36.6 C)    TempSrc: Temporal    SpO2: 100%    Weight: 160 lb 9.6 oz (72.8 kg)    Height: 5' 6.6 (1.692 m)       Physical Exam Vitals reviewed.  Constitutional:      Appearance: Normal appearance. She is well-developed.  HENT:     Head: Normocephalic and atraumatic.  Eyes:     Conjunctiva/sclera: Conjunctivae normal.     Pupils: Pupils are equal, round, and reactive to light.  Neck:     Vascular: No carotid bruit.  Cardiovascular:     Rate and Rhythm: Normal rate and regular rhythm.     Heart sounds: Normal heart sounds.  Pulmonary:     Effort: Pulmonary effort is normal.     Breath sounds: Normal breath sounds.  Abdominal:     Palpations: Abdomen is soft. There is no pulsatile mass.     Tenderness: There is no abdominal tenderness.  Musculoskeletal:     Right lower leg: No edema.     Left lower leg: No edema.  Skin:     General: Skin is warm and dry.  Neurological:     Mental Status: She is alert and oriented to person, place, and time.  Psychiatric:        Mood and Affect: Mood normal.        Behavior: Behavior normal.  Results for orders placed or performed in visit on 08/20/24  CBC w/Diff   Collection Time: 08/20/24 10:37 AM  Result Value Ref Range   WBC 7.1 4.0 - 10.5 K/uL   RBC 4.72 3.87 - 5.11 Mil/uL   Hemoglobin 14.5 12.0 - 15.0 g/dL   HCT 56.6 63.9 - 53.9 %   MCV 91.9 78.0 - 100.0 fl   MCHC 33.5 30.0 - 36.0 g/dL   RDW 86.1 88.4 - 84.4 %   Platelets 276.0 150.0 - 400.0 K/uL   Neutrophils Relative % 58.0 43.0 - 77.0 %   Lymphocytes Relative 29.3 12.0 - 46.0 %   Monocytes Relative 9.0 3.0 - 12.0 %   Eosinophils Relative 3.0 0.0 - 5.0 %   Basophils Relative 0.7 0.0 - 3.0 %   Neutro Abs 4.1 1.4 - 7.7 K/uL   Lymphs Abs 2.1 0.7 - 4.0 K/uL   Monocytes Absolute 0.6 0.1 - 1.0 K/uL   Eosinophils Absolute 0.2 0.0 - 0.7 K/uL   Basophils Absolute 0.0 0.0 - 0.1 K/uL  HgB A1c   Collection Time: 08/20/24 10:37 AM  Result Value Ref Range   Hgb A1c MFr Bld 5.3 4.6 - 6.5 %  Comprehensive metabolic panel with GFR   Collection Time: 08/20/24 10:37 AM  Result Value Ref Range   Sodium 140 135 - 145 mEq/L   Potassium 4.5 3.5 - 5.1 mEq/L   Chloride 104 96 - 112 mEq/L   CO2 31 19 - 32 mEq/L   Glucose, Bld 95 70 - 99 mg/dL   BUN 17 6 - 23 mg/dL   Creatinine, Ser 9.15 0.40 - 1.20 mg/dL   Total Bilirubin 0.6 0.2 - 1.2 mg/dL   Alkaline Phosphatase 83 39 - 117 U/L   AST 15 0 - 37 U/L   ALT 11 0 - 35 U/L   Total Protein 7.1 6.0 - 8.3 g/dL   Albumin 4.9 3.5 - 5.2 g/dL   GFR 31.44 >39.99 mL/min   Calcium  9.9 8.4 - 10.5 mg/dL  Lipid panel   Collection Time: 08/20/24 10:37 AM  Result Value Ref Range   Cholesterol 170 0 - 200 mg/dL   Triglycerides 17.9 0.0 - 149.0 mg/dL   HDL 28.79 >60.99 mg/dL   VLDL 83.5 0.0 - 59.9 mg/dL   LDL Cholesterol 82 0 - 99 mg/dL   Total CHOL/HDL Ratio 2    NonHDL 98.32        Assessment & Plan:  [name] is a 73 y.o. female . Preoperative evaluation to rule out surgical contraindication - Plan: CBC w/Diff Mixed hyperlipidemia - Plan: Comprehensive metabolic panel with GFR, Lipid panel Essential hypertension History of hyperglycemia - Plan: HgB A1c  - Reassuring labs with normal CBC, CMET, lipid panel, and A1c.  Based on history above appears to have acceptable risk for planned procedure with low RCRI score (score 1?  With history of chronic diastolic CHF.  Grade 1 DD on echo in 2022), no cardiac symptoms with exertion.  Paperwork completed for careers adviser.   Insomnia, unspecified type - Plan: zolpidem (AMBIEN) 5 MG tablet  - Unfortunately ineffective treatment over-the-counter and various regimens have been attempted previously.  She has tried lower dose Ambien and would like to try that again as that was effective.  We did discuss potential risks, side effects and precautions with that medication including at age 61.  She is aware of risks.  Will prescribe 5 mg dosing with 1/2-1 as needed at bedtime, discussed short-term dosing  as plan, recheck next few months to decide if further treatment needed.  Mild OSA previously, not treated, but less likely primary cause of insomnia.  History of migraine headaches  - Stable with infrequent need of meds, continue to monitor.  Depression, recurrent  - Stable with current med regimen, continue same dose Wellbutrin .  Meds ordered this encounter  Medications   zolpidem (AMBIEN) 5 MG tablet    Sig: Take 0.5-1 tablets (2.5-5 mg total) by mouth at bedtime as needed for sleep.    Dispense:  30 tablet    Refill:  2   Patient Instructions  Thank you for coming in today.  I will check some labs and then we will complete your paperwork for your surgeon.  You appear to be at acceptable risk for upcoming surgery from a medical standpoint. I know I have provided information previously, but please refer to the  information about sleep below.  Given your difficulty with sleep and intolerance to other medications, and ineffective over-the-counter treatments, I have agreed to temporarily prescribe Ambien, low-dose.  Try taking 1/2 pill initially to see if that is effective and watch for side effects as we discussed.  Again this should be short-term and we can discuss other options when I see you in the next few months.  If any side effects or difficulty with this medication let me know right away.  If any concerns on your labs I will let you know, no medication changes at this time.  Check your blood pressures at home.  Slight elevated readings today could be due to pain.  If you have persistent elevated readings, let me know and we may need to make changes in your medications.  Take care!   Insomnia Insomnia is a sleep disorder that makes it difficult to fall asleep or stay asleep. Insomnia can cause fatigue, low energy, difficulty concentrating, mood swings, and poor performance at work or school. There are three different ways to classify insomnia: Difficulty falling asleep. Difficulty staying asleep. Waking up too early in the morning. Any type of insomnia can be long-term (chronic) or short-term (acute). Both are common. Short-term insomnia usually lasts for 3 months or less. Chronic insomnia occurs at least three times a week for longer than 3 months. What are the causes? Insomnia may be caused by another condition, situation, or substance, such as: Having certain mental health conditions, such as anxiety and depression. Using caffeine, alcohol, tobacco, or drugs. Having gastrointestinal conditions, such as gastroesophageal reflux disease (GERD). Having certain medical conditions. These include: Asthma. Alzheimer's disease. Stroke. Chronic pain. An overactive thyroid  gland (hyperthyroidism). Other sleep disorders, such as restless legs syndrome and sleep apnea. Menopause. Sometimes, the cause of  insomnia may not be known. What increases the risk? Risk factors for insomnia include: Gender. Females are affected more often than males. Age. Insomnia is more common as people get older. Stress and certain medical and mental health conditions. Lack of exercise. Having an irregular work schedule. This may include working night shifts and traveling between different time zones. What are the signs or symptoms? If you have insomnia, the main symptom is having trouble falling asleep or having trouble staying asleep. This may lead to other symptoms, such as: Feeling tired or having low energy. Feeling nervous about going to sleep. Not feeling rested in the morning. Having trouble concentrating. Feeling irritable, anxious, or depressed. How is this diagnosed? This condition may be diagnosed based on: Your symptoms and medical history. Your health care provider  may ask about: Your sleep habits. Any medical conditions you have. Your mental health. A physical exam. How is this treated? Treatment for insomnia depends on the cause. Treatment may focus on treating an underlying condition that is causing the insomnia. Treatment may also include: Medicines to help you sleep. Counseling or therapy. Lifestyle adjustments to help you sleep better. Follow these instructions at home: Eating and drinking  Limit or avoid alcohol, caffeinated beverages, and products that contain nicotine and tobacco, especially close to bedtime. These can disrupt your sleep. Do not eat a large meal or eat spicy foods right before bedtime. This can lead to digestive discomfort that can make it hard for you to sleep. Sleep habits  Keep a sleep diary to help you and your health care provider figure out what could be causing your insomnia. Write down: When you sleep. When you wake up during the night. How well you sleep and how rested you feel the next day. Any side effects of medicines you are taking. What you eat and  drink. Make your bedroom a dark, comfortable place where it is easy to fall asleep. Put up shades or blackout curtains to block light from outside. Use a white noise machine to block noise. Keep the temperature cool. Limit screen use before bedtime. This includes: Not watching TV. Not using your smartphone, tablet, or computer. Stick to a routine that includes going to bed and waking up at the same times every day and night. This can help you fall asleep faster. Consider making a quiet activity, such as reading, part of your nighttime routine. Try to avoid taking naps during the day so that you sleep better at night. Get out of bed if you are still awake after 15 minutes of trying to sleep. Keep the lights down, but try reading or doing a quiet activity. When you feel sleepy, go back to bed. General instructions Take over-the-counter and prescription medicines only as told by your health care provider. Exercise regularly as told by your health care provider. However, avoid exercising in the hours right before bedtime. Use relaxation techniques to manage stress. Ask your health care provider to suggest some techniques that may work well for you. These may include: Breathing exercises. Routines to release muscle tension. Visualizing peaceful scenes. Make sure that you drive carefully. Do not drive if you feel very sleepy. Keep all follow-up visits. This is important. Contact a health care provider if: You are tired throughout the day. You have trouble in your daily routine due to sleepiness. You continue to have sleep problems, or your sleep problems get worse. Get help right away if: You have thoughts about hurting yourself or someone else. Get help right away if you feel like you may hurt yourself or others, or have thoughts about taking your own life. Go to your nearest emergency room or: Call 911. Call the National Suicide Prevention Lifeline at 937-183-4699 or 988. This is open 24  hours a day. Text the Crisis Text Line at 440-198-4501. Summary Insomnia is a sleep disorder that makes it difficult to fall asleep or stay asleep. Insomnia can be long-term (chronic) or short-term (acute). Treatment for insomnia depends on the cause. Treatment may focus on treating an underlying condition that is causing the insomnia. Keep a sleep diary to help you and your health care provider figure out what could be causing your insomnia. This information is not intended to replace advice given to you by your health care provider. Make sure you  discuss any questions you have with your health care provider. Document Revised: 08/07/2021 Document Reviewed: 08/07/2021 Elsevier Patient Education  2024 [employer]     Signed,   Reyes Pines, MD [facility], [facility] 08/20/2024 10:28 AM      [1]  Allergies Allergen Reactions   Lactose Intolerance (Gi) Diarrhea   Nsaids Other (See Comments)    Hurts stomach   Other Rash and Other (See Comments)    Retinol cream   Retinoids Hives, Itching and Rash

## 2024-08-20 NOTE — Patient Instructions (Addendum)
 Thank you for coming in today.  I will check some labs and then we will complete your paperwork for your surgeon.  You appear to be at acceptable risk for upcoming surgery from a medical standpoint. I know I have provided information previously, but please refer to the information about sleep below.  Given your difficulty with sleep and intolerance to other medications, and ineffective over-the-counter treatments, I have agreed to temporarily prescribe Ambien, low-dose.  Try taking 1/2 pill initially to see if that is effective and watch for side effects as we discussed.  Again this should be short-term and we can discuss other options when I see you in the next few months.  If any side effects or difficulty with this medication let me know right away.  If any concerns on your labs I will let you know, no medication changes at this time.  Check your blood pressures at home.  Slight elevated readings today could be due to pain.  If you have persistent elevated readings, let me know and we may need to make changes in your medications.  Take care!   Insomnia Insomnia is a sleep disorder that makes it difficult to fall asleep or stay asleep. Insomnia can cause fatigue, low energy, difficulty concentrating, mood swings, and poor performance at work or school. There are three different ways to classify insomnia: Difficulty falling asleep. Difficulty staying asleep. Waking up too early in the morning. Any type of insomnia can be long-term (chronic) or short-term (acute). Both are common. Short-term insomnia usually lasts for 3 months or less. Chronic insomnia occurs at least three times a week for longer than 3 months. What are the causes? Insomnia may be caused by another condition, situation, or substance, such as: Having certain mental health conditions, such as anxiety and depression. Using caffeine, alcohol, tobacco, or drugs. Having gastrointestinal conditions, such as gastroesophageal reflux disease  (GERD). Having certain medical conditions. These include: Asthma. Alzheimer's disease. Stroke. Chronic pain. An overactive thyroid  gland (hyperthyroidism). Other sleep disorders, such as restless legs syndrome and sleep apnea. Menopause. Sometimes, the cause of insomnia may not be known. What increases the risk? Risk factors for insomnia include: Gender. Females are affected more often than males. Age. Insomnia is more common as people get older. Stress and certain medical and mental health conditions. Lack of exercise. Having an irregular work schedule. This may include working night shifts and traveling between different time zones. What are the signs or symptoms? If you have insomnia, the main symptom is having trouble falling asleep or having trouble staying asleep. This may lead to other symptoms, such as: Feeling tired or having low energy. Feeling nervous about going to sleep. Not feeling rested in the morning. Having trouble concentrating. Feeling irritable, anxious, or depressed. How is this diagnosed? This condition may be diagnosed based on: Your symptoms and medical history. Your health care provider may ask about: Your sleep habits. Any medical conditions you have. Your mental health. A physical exam. How is this treated? Treatment for insomnia depends on the cause. Treatment may focus on treating an underlying condition that is causing the insomnia. Treatment may also include: Medicines to help you sleep. Counseling or therapy. Lifestyle adjustments to help you sleep better. Follow these instructions at home: Eating and drinking  Limit or avoid alcohol, caffeinated beverages, and products that contain nicotine and tobacco, especially close to bedtime. These can disrupt your sleep. Do not eat a large meal or eat spicy foods right before bedtime. This can  lead to digestive discomfort that can make it hard for you to sleep. Sleep habits  Keep a sleep diary to  help you and your health care provider figure out what could be causing your insomnia. Write down: When you sleep. When you wake up during the night. How well you sleep and how rested you feel the next day. Any side effects of medicines you are taking. What you eat and drink. Make your bedroom a dark, comfortable place where it is easy to fall asleep. Put up shades or blackout curtains to block light from outside. Use a white noise machine to block noise. Keep the temperature cool. Limit screen use before bedtime. This includes: Not watching TV. Not using your smartphone, tablet, or computer. Stick to a routine that includes going to bed and waking up at the same times every day and night. This can help you fall asleep faster. Consider making a quiet activity, such as reading, part of your nighttime routine. Try to avoid taking naps during the day so that you sleep better at night. Get out of bed if you are still awake after 15 minutes of trying to sleep. Keep the lights down, but try reading or doing a quiet activity. When you feel sleepy, go back to bed. General instructions Take over-the-counter and prescription medicines only as told by your health care provider. Exercise regularly as told by your health care provider. However, avoid exercising in the hours right before bedtime. Use relaxation techniques to manage stress. Ask your health care provider to suggest some techniques that may work well for you. These may include: Breathing exercises. Routines to release muscle tension. Visualizing peaceful scenes. Make sure that you drive carefully. Do not drive if you feel very sleepy. Keep all follow-up visits. This is important. Contact a health care provider if: You are tired throughout the day. You have trouble in your daily routine due to sleepiness. You continue to have sleep problems, or your sleep problems get worse. Get help right away if: You have thoughts about hurting  yourself or someone else. Get help right away if you feel like you may hurt yourself or others, or have thoughts about taking your own life. Go to your nearest emergency room or: Call 911. Call the National Suicide Prevention Lifeline at 629-714-5960 or 988. This is open 24 hours a day. Text the Crisis Text Line at (505)843-0788. Summary Insomnia is a sleep disorder that makes it difficult to fall asleep or stay asleep. Insomnia can be long-term (chronic) or short-term (acute). Treatment for insomnia depends on the cause. Treatment may focus on treating an underlying condition that is causing the insomnia. Keep a sleep diary to help you and your health care provider figure out what could be causing your insomnia. This information is not intended to replace advice given to you by your health care provider. Make sure you discuss any questions you have with your health care provider. Document Revised: 08/07/2021 Document Reviewed: 08/07/2021 Elsevier Patient Education  2024 [employer].

## 2024-08-21 ENCOUNTER — Ambulatory Visit: Payer: Self-pay | Admitting: Family Medicine

## 2024-11-18 ENCOUNTER — Ambulatory Visit: Admitting: Family Medicine
